# Patient Record
Sex: Male | Born: 1965 | Race: White | Hispanic: No | Marital: Married | Smoking: Former smoker
Health system: Southern US, Community
[De-identification: ages and names within clinical notes are randomized; demographics above are authoritative.]

## PROBLEM LIST (undated history)

## (undated) DIAGNOSIS — I509 Heart failure, unspecified: Secondary | ICD-10-CM

## (undated) DIAGNOSIS — I2510 Atherosclerotic heart disease of native coronary artery without angina pectoris: Secondary | ICD-10-CM

## (undated) DIAGNOSIS — I4891 Unspecified atrial fibrillation: Secondary | ICD-10-CM

## (undated) DIAGNOSIS — Z87442 Personal history of urinary calculi: Secondary | ICD-10-CM

---

## 2006-07-01 ENCOUNTER — Ambulatory Visit: Payer: Self-pay | Admitting: Urology

## 2006-09-21 ENCOUNTER — Ambulatory Visit: Payer: Self-pay | Admitting: Urology

## 2011-06-15 ENCOUNTER — Observation Stay: Payer: Self-pay | Admitting: Internal Medicine

## 2011-06-15 LAB — MAGNESIUM: Magnesium: 1.7 mg/dL — ABNORMAL LOW

## 2011-06-15 LAB — COMPREHENSIVE METABOLIC PANEL
Albumin: 3.9 g/dL (ref 3.4–5.0)
Anion Gap: 9 (ref 7–16)
BUN: 11 mg/dL (ref 7–18)
Bilirubin,Total: 0.4 mg/dL (ref 0.2–1.0)
Calcium, Total: 8.7 mg/dL (ref 8.5–10.1)
Chloride: 106 mmol/L (ref 98–107)
EGFR (African American): >60
Glucose: 103 mg/dL — ABNORMAL HIGH (ref 65–99)
Potassium: 3.7 mmol/L (ref 3.5–5.1)
Sodium: 138 mmol/L (ref 136–145)
Total Protein: 7.3 g/dL (ref 6.4–8.2)

## 2011-06-15 LAB — CK TOTAL AND CKMB (NOT AT ARMC)
CK, Total: 232 U/L
CK-MB: 1.9 ng/mL

## 2011-06-15 LAB — CBC
HGB: 16.3 g/dL (ref 13.0–18.0)
MCH: 31.4 pg (ref 26.0–34.0)
MCHC: 34.6 g/dL (ref 32.0–36.0)
MCV: 91 fL (ref 80–100)
RDW: 13 % (ref 11.5–14.5)

## 2011-06-15 LAB — TROPONIN I: Troponin-I: 0.09 ng/mL — ABNORMAL HIGH

## 2011-06-15 LAB — APTT: Activated PTT: 32.3 s

## 2011-06-15 LAB — PROTIME-INR
INR: 1
Prothrombin Time: 13.6 s

## 2011-06-15 LAB — CK-MB: CK-MB: 2.5 ng/mL (ref 0.5–3.6)

## 2011-06-16 LAB — COMPREHENSIVE METABOLIC PANEL
Albumin: 3.5 g/dL (ref 3.4–5.0)
Anion Gap: 8 (ref 7–16)
BUN: 12 mg/dL (ref 7–18)
Bilirubin,Total: 0.5 mg/dL (ref 0.2–1.0)
Creatinine: 0.92 mg/dL (ref 0.60–1.30)
EGFR (African American): >60
Glucose: 104 mg/dL — ABNORMAL HIGH (ref 65–99)
Osmolality: 279 (ref 275–301)
Potassium: 3.9 mmol/L (ref 3.5–5.1)
SGOT(AST): 20 U/L (ref 15–37)
SGPT (ALT): 25 U/L
Total Protein: 6.5 g/dL (ref 6.4–8.2)

## 2011-06-16 LAB — CBC WITH DIFFERENTIAL/PLATELET
Basophil #: 0.1 10*3/uL (ref 0.0–0.1)
Basophil %: 0.5 %
Eosinophil #: 0.3 10*3/uL (ref 0.0–0.7)
HGB: 14.8 g/dL (ref 13.0–18.0)
Lymphocyte #: 2.4 10*3/uL (ref 1.0–3.6)
Lymphocyte %: 20.1 %
MCH: 31 pg (ref 26.0–34.0)
MCHC: 34.3 g/dL (ref 32.0–36.0)
Neutrophil #: 8.2 10*3/uL — ABNORMAL HIGH (ref 1.4–6.5)
Neutrophil %: 70 %
RBC: 4.77 10*6/uL (ref 4.40–5.90)
RDW: 12.9 % (ref 11.5–14.5)
WBC: 11.8 10*3/uL — ABNORMAL HIGH (ref 3.8–10.6)

## 2011-06-16 LAB — LIPID PANEL
Cholesterol: 192 mg/dL (ref 0–200)
Ldl Cholesterol, Calc: 136 mg/dL — ABNORMAL HIGH (ref 0–100)
Triglycerides: 163 mg/dL (ref 0–200)
VLDL Cholesterol, Calc: 33 mg/dL (ref 5–40)

## 2011-06-16 LAB — CK-MB: CK-MB: 1.9 ng/mL (ref 0.5–3.6)

## 2011-10-15 ENCOUNTER — Inpatient Hospital Stay: Payer: Self-pay | Admitting: Internal Medicine

## 2011-10-15 LAB — CBC
HGB: 14.9 g/dL (ref 13.0–18.0)
MCH: 30 pg (ref 26.0–34.0)
MCHC: 33.8 g/dL (ref 32.0–36.0)
MCV: 89 fL (ref 80–100)
Platelet: 212 10*3/uL (ref 150–440)
RDW: 13 % (ref 11.5–14.5)

## 2011-10-15 LAB — BASIC METABOLIC PANEL
Anion Gap: 8 (ref 7–16)
Creatinine: 0.8 mg/dL (ref 0.60–1.30)
EGFR (African American): >60
EGFR (Non-African Amer.): >60
Glucose: 119 mg/dL — ABNORMAL HIGH (ref 65–99)
Sodium: 137 mmol/L (ref 136–145)

## 2011-10-15 LAB — TROPONIN I: Troponin-I: 0.98 ng/mL — ABNORMAL HIGH

## 2011-10-15 LAB — CK TOTAL AND CKMB (NOT AT ARMC): CK-MB: 10.6 ng/mL — ABNORMAL HIGH (ref 0.5–3.6)

## 2011-10-16 DIAGNOSIS — R079 Chest pain, unspecified: Secondary | ICD-10-CM

## 2011-10-16 LAB — LIPID PANEL
Cholesterol: 208 mg/dL — ABNORMAL HIGH (ref 0–200)
HDL Cholesterol: 29 mg/dL — ABNORMAL LOW (ref 40–60)
Ldl Cholesterol, Calc: 159 mg/dL — ABNORMAL HIGH (ref 0–100)
Triglycerides: 100 mg/dL (ref 0–200)
VLDL Cholesterol, Calc: 20 mg/dL (ref 5–40)

## 2011-10-16 LAB — TROPONIN I
Troponin-I: 18 ng/mL — ABNORMAL HIGH
Troponin-I: 27.59 ng/mL — ABNORMAL HIGH

## 2011-10-16 LAB — BASIC METABOLIC PANEL
Anion Gap: 9 (ref 7–16)
BUN: 14 mg/dL (ref 7–18)
Calcium, Total: 8.5 mg/dL (ref 8.5–10.1)
Chloride: 106 mmol/L (ref 98–107)
Co2: 24 mmol/L (ref 21–32)
Osmolality: 280 (ref 275–301)
Potassium: 4.1 mmol/L (ref 3.5–5.1)
Sodium: 139 mmol/L (ref 136–145)

## 2011-10-16 LAB — CBC WITH DIFFERENTIAL/PLATELET
Basophil %: 0.5 %
Eosinophil #: 0.1 10*3/uL (ref 0.0–0.7)
Eosinophil %: 1 %
HCT: 42.1 % (ref 40.0–52.0)
HGB: 13.7 g/dL (ref 13.0–18.0)
Lymphocyte #: 1.8 10*3/uL (ref 1.0–3.6)
Lymphocyte %: 15.2 %
MCHC: 32.4 g/dL (ref 32.0–36.0)
Neutrophil #: 8.9 10*3/uL — ABNORMAL HIGH (ref 1.4–6.5)
Neutrophil %: 76.5 %
Platelet: 200 10*3/uL (ref 150–440)
RBC: 4.78 10*6/uL (ref 4.40–5.90)

## 2011-10-16 LAB — CK TOTAL AND CKMB (NOT AT ARMC)
CK, Total: 1443 U/L — ABNORMAL HIGH (ref 35–232)
CK, Total: 1589 U/L — ABNORMAL HIGH (ref 35–232)
CK, Total: 1148 U/L — ABNORMAL HIGH (ref 35–232)
CK-MB: 90.8 ng/mL — ABNORMAL HIGH (ref 0.5–3.6)
CK-MB: 37 ng/mL — ABNORMAL HIGH (ref 0.5–3.6)

## 2011-10-16 LAB — MAGNESIUM: Magnesium: 2 mg/dL

## 2011-10-16 LAB — APTT: Activated PTT: 80.2 secs — ABNORMAL HIGH (ref 23.6–35.9)

## 2011-10-16 LAB — HEMOGLOBIN A1C: Hemoglobin A1C: 5.4 % (ref 4.2–6.3)

## 2011-10-17 LAB — BASIC METABOLIC PANEL
Anion Gap: 10 (ref 7–16)
Calcium, Total: 8.1 mg/dL — ABNORMAL LOW (ref 8.5–10.1)
Co2: 21 mmol/L (ref 21–32)
EGFR (African American): >60
Glucose: 85 mg/dL (ref 65–99)
Osmolality: 274 (ref 275–301)
Sodium: 138 mmol/L (ref 136–145)

## 2011-10-18 LAB — MAGNESIUM: Magnesium: 1.8 mg/dL

## 2011-10-21 DIAGNOSIS — I255 Ischemic cardiomyopathy: Secondary | ICD-10-CM | POA: Diagnosis present

## 2012-08-13 DIAGNOSIS — I252 Old myocardial infarction: Secondary | ICD-10-CM | POA: Insufficient documentation

## 2013-07-15 ENCOUNTER — Inpatient Hospital Stay: Payer: Self-pay | Admitting: Cardiology

## 2013-07-15 LAB — CBC
HCT: 46.6 % (ref 40.0–52.0)
HGB: 16.4 g/dL (ref 13.0–18.0)
MCH: 30.8 pg (ref 26.0–34.0)
MCHC: 35.1 g/dL (ref 32.0–36.0)
MCV: 88 fL (ref 80–100)
Platelet: 256 10*3/uL (ref 150–440)
RBC: 5.3 10*6/uL (ref 4.40–5.90)
RDW: 13 % (ref 11.5–14.5)
WBC: 12.9 10*3/uL — ABNORMAL HIGH (ref 3.8–10.6)

## 2013-07-15 LAB — BASIC METABOLIC PANEL
Anion Gap: 10 (ref 7–16)
BUN: 20 mg/dL — AB (ref 7–18)
CO2: 22 mmol/L (ref 21–32)
CREATININE: 0.77 mg/dL (ref 0.60–1.30)
Calcium, Total: 8.4 mg/dL — ABNORMAL LOW (ref 8.5–10.1)
Chloride: 105 mmol/L (ref 98–107)
Glucose: 122 mg/dL — ABNORMAL HIGH (ref 65–99)
Osmolality: 278 (ref 275–301)
Potassium: 4.3 mmol/L (ref 3.5–5.1)
Sodium: 137 mmol/L (ref 136–145)

## 2013-07-15 LAB — PROTIME-INR
INR: 1.2
Prothrombin Time: 15.1 secs — ABNORMAL HIGH (ref 11.5–14.7)

## 2013-07-15 LAB — TROPONIN I: Troponin-I: <0.02 ng/mL

## 2013-07-15 LAB — URINALYSIS, COMPLETE
BACTERIA: NONE SEEN
Bilirubin,UR: NEGATIVE
Glucose,UR: NEGATIVE mg/dL (ref 0–75)
Ketone: NEGATIVE
Leukocyte Esterase: NEGATIVE
NITRITE: NEGATIVE
Ph: 5 (ref 4.5–8.0)
Protein: NEGATIVE
RBC,UR: <1 /HPF (ref 0–5)
SPECIFIC GRAVITY: 1.009 (ref 1.003–1.030)
Squamous Epithelial: NONE SEEN
WBC UR: <1 /HPF (ref 0–5)

## 2013-07-15 LAB — HEPATIC FUNCTION PANEL A (ARMC)
ALT: 26 U/L (ref 12–78)
Albumin: 2.6 g/dL — ABNORMAL LOW (ref 3.4–5.0)
Alkaline Phosphatase: 60 U/L
Bilirubin, Direct: <0.1 mg/dL (ref 0.00–0.20)
Bilirubin,Total: 0.2 mg/dL (ref 0.2–1.0)
SGOT(AST): 19 U/L (ref 15–37)
Total Protein: 5 g/dL — ABNORMAL LOW (ref 6.4–8.2)

## 2013-07-15 LAB — LIPASE, BLOOD: Lipase: 236 U/L (ref 73–393)

## 2013-07-15 LAB — APTT: ACTIVATED PTT: 36.8 s — AB (ref 23.6–35.9)

## 2013-07-15 LAB — TSH: Thyroid Stimulating Horm: 10.1 u[IU]/mL — ABNORMAL HIGH

## 2013-07-15 LAB — MAGNESIUM: Magnesium: 1.5 mg/dL — ABNORMAL LOW

## 2014-07-11 NOTE — H&P (Signed)
PATIENT NAME:  Andres Andres [name], Andres Andres [name] MR#:  [id number] DATE OF BIRTH:  Oct 19, 1965  DATE OF ADMISSION:  10/15/2011  REFERRING PHYSICIAN: Dr. Cyril Andres [name] PRIMARY CARE PHYSICIAN: Dr. Dayna Andres [name]    CHIEF COMPLAINT: Chest pain.   HISTORY OF PRESENT ILLNESS: Patient is Andres [name] 49 year old Caucasian male with history of hyperlipidemia, psoriasis and gastroesophageal reflux disease who was here for chest pain. Patient was in his usual state of self until about 1:30 p.m. He was doing work around the house. He was on Andres [name] ladder and started to have substernal chest pain, severe, constant, crushing. There was some radiation to the left arm. Patient initially thought this was more of indigestion. Pain was off and on, however, currently it is constant. Patient presented here and was noted to have elevated troponin of 0.98 with elevated CK-MB. EKGs were obtained. Initial EKG showed possible ST elevation in V1 and some in V2 as well. There is no reciprocal changes per se. An EKG was repeated in about 30 minutes and there is significant improvement in the ST elevations in V1 and V2. As he is still having chest pain on top of morphine and sublingual nitroglycerin hospitalist service was contacted for further evaluation and admission. Heparin drip has been ordered by ER physician which still has not been started.   PAST MEDICAL HISTORY:  1. History of psoriasis, no longer on therapy. 2. History of anxiety and depression.  3. History of septoplasty 2002. 4. Gastroesophageal reflux disease. 5. Hyperlipidemia.   CURRENT MEDICATIONS:  1. Aspirin 81 mg daily. 2. Dexilant 60 mg one cap daily.  3. Lipitor 20 mg daily at bedtime.   ALLERGIES: No known drug allergies.   SOCIAL HISTORY: He smokes Andres [name] pack Andres [name] day. No alcohol or drug use.   FAMILY HISTORY: Mom with hypertension and diabetes as well as MI in her 3950s. She had bypass x4. Father with cerebrovascular accident. Paternal grandfather had MI as well. Family history of cancer.   REVIEW  OF SYSTEMS: CONSTITUTIONAL: No fever, fatigue or weakness. No weight changes. EYES: No blurry vision or double vision. ENT: No tinnitus or hearing loss. RESPIRATORY: No cough, wheezing, hemoptysis, or chronic obstructive pulmonary disease. CARDIOVASCULAR: Chest pain as above. No orthopnea or edema. No dyspnea on exertion. No history of high blood pressure. GASTROINTESTINAL: No nausea, vomiting, abdominal pain, or rectal bleeding. GENITOURINARY: Denies dysuria, frequency or incontinence. ENDOCRINE: Denies polyuria or nocturia. HEME/LYMPH: Denies easy bruising. SKIN: Denies any new rashes. His psoriasis is stable. MUSCULOSKELETAL: No arthritis. NEUROLOGIC: No numbness or weakness or CVA or transient ischemic attack. PSYCHIATRIC: Has history of anxiety and depression per chart which he denies.   PHYSICAL EXAMINATION:  VITAL SIGNS: Temperature 98, pulse 68, respiratory rate 18, blood pressure 126/74, oxygen sat 99% on room air.   GENERAL: Patient is Andres [name] mildly obese Caucasian male lying in bed in mild distress.   HEENT: Normocephalic, atraumatic. Pupils are equal and reactive. Extraocular muscles intact. Anicteric sclerae. Moist mucous membranes.   NECK: Supple. No thyroid tenderness. No lymphadenopathy. No JVD.   CARDIOVASCULAR: S1, S2. No murmurs, rubs, or gallops.   LUNGS: Clear to auscultation without wheezing or rhonchi.   ABDOMEN: Soft, nontender, nondistended. No organomegaly appreciated.   EXTREMITIES: No significant lower extremity edema.   SKIN: Scattered multiple areas of plaques and patches consistent with psoriasis.   NEUROLOGICAL: Cranial nerves II through XII grossly intact. Strength is 5/5 all extremities.   PSYCH: Awake, alert, oriented x3. Pleasant, cooperative.   LABORATORY, DIAGNOSTIC, AND RADIOLOGICAL DATA: Chloride  108, potassium 3.7, sodium 137, glucose 119, troponin 0.98, CK-MB 10.6. CK total 838, WBC 11.2, hemoglobin 14.9, hematocrit 44.3, platelets 212. Initial EKG rate  70 sinus, otherwise as above. Second EKG again sinus, rate 68, possible left atrial enlargement just like the first, as dictated above otherwise.   ASSESSMENT AND PLAN: We have Andres [name] 49 year old male with history of hyperlipidemia, gastroesophageal reflux disease, anxiety, depression and psoriasis here with chest pain with very typical features including EKG changes which are dynamic and positive troponins consistent with NSTEMI. The case was then discussed with the ER physician as well as Andres Andres [name]. As he has ongoing chest pains with possibility of progression we would admit the patient to Critical Care Unit. Heparin drip has been ordered. We would also start nitroglycerin drip and load the patient with Plavix 300 mg. Aspirin has already been given to the patient. We would start the patient on statin and Andres [name] beta blocker with morphine p.r.n. as well as oxygen. We would follow back and chest pains. Per Andres Andres [name] we would consider Integrilin. Would check Andres [name] statin, cycle the troponins and get an echocardiogram as well. Patient would likely need Andres [name] catheterization and we would make the patient n.p.o. past midnight. The case was discussed with Andres Andres [name] whether he would benefit from transfer to [facility] and catheterization for tonight, which he deems is not necessary given improvement in the EKG. He does smoke tobacco and he was counseled for three minutes. He does want Andres [name] patch which we would order. For psoriasis he needs outpatient follow up. Patient is at Andres [name] high risk for further progression of his symptoms and cardiac complications.   TOTAL CRITICAL TIME SPENT: 55 minutes.   CODE STATUS: FULL CODE.   ____________________________ Andres Eaton, MD sa:cms D: 10/15/2011 19:52:33 ET T: 10/16/2011 06:49:24 ET JOB#: [id number]  cc: Andres Eaton, MD, <Dictator> Andres Andres [name]. Andres Barker, MD Andres Blinks, MD Andres Eaton MD ELECTRONICALLY SIGNED 10/17/2011 19:13

## 2014-07-11 NOTE — Consult Note (Signed)
PATIENT NAME:  Andres [name], Andres [name] MR#:  [id number] DATE OF BIRTH:  [birth year]  DATE OF CONSULTATION:  10/16/2011  REFERRING PHYSICIAN:  Krystal Eaton, MD CONSULTING PHYSICIAN:  Lamar Blinks, MD  PRIMARY CARE PHYSICIAN: None known.  REASON FOR CONSULTATION: Acute myocardial infarction.   CHIEF COMPLAINT: "I have chest pain."   HISTORY OF PRESENT ILLNESS: This is a 49 year old male with known hypertension and hyperlipidemia who has had recent admission to the hospital earlier this year with chest discomfort and minimal elevation of troponin of 0.9. At that time, he had significant improvement of symptoms and no evidence of EKG changes. He underwent a stress test showing excellent exercise tolerance and no evidence of myocardial ischemia. Since then he has done fairly well until sudden onset of substernal chest pain radiating into the arm and back lasting approximately one hour with EKG changes with normal sinus rhythm with nonspecific ST elevation in the precordial leads. The patient had nitroglycerin with improvements of these minimal changes and now has had continued chest discomfort, but improved with nitroglycerin, oxygen, morphine, and beta blocker. The patient now has some soreness in his chest with an elevated troponin below 1.2.   REVIEW OF SYSTEMS: The remainder of his review of systems is negative for vision change, ringing in the ears, hearing loss, cough, congestion, heartburn, nausea, vomiting, diarrhea, bloody stools, stomach pain, extremity pain, leg weakness, cramping of the buttocks, known blood clots, headaches, blackouts, dizzy spells, nosebleeds, congestion, trouble swallowing, frequent urination, urination at night, muscle weakness, numbness, anxiety, depression, skin lesions, or skin rashes.   PAST MEDICAL HISTORY:  1. Hypertension.  2. Hyperlipidemia.   FAMILY HISTORY: No family members with early onset of cardiovascular disease.   SOCIAL HISTORY: He does smoke and  occasionally drinks alcohol.   ALLERGIES: No known drug allergies.   CURRENT MEDICATIONS: As listed.  PHYSICAL EXAMINATION:   VITAL SIGNS: Blood pressure is 100/60 bilaterally and heart rate 72 upright and reclining and regular.   GENERAL: He is a well appearing male in no acute distress.   HEENT: No icterus, thyromegaly, ulcers, hemorrhage, or xanthelasma.   HEART: Regular rate and rhythm with normal S1 and S2 without murmur, gallop, or rub. Point of maximal impulse is normal size and placement. Carotid upstroke is normal without bruit. Jugular venous pressure is normal.   LUNGS: Lungs are clear to auscultation with normal respirations.   ABDOMEN: Soft and nontender without hepatosplenomegaly or masses. Abdominal aorta is normal size without bruit.   EXTREMITIES: 2+ bilateral pulses in dorsal, pedal, radial, and femoral arteries without lower extremity edema, cyanosis, clubbing, or ulcers.   NEUROLOGIC: He is oriented to time, place, and person with normal mood and affect.   ASSESSMENT: This is a 49 year old male with hypertension and hyperlipidemia with acute chest pain and abnormal EKG with elevated troponin consistent with subendocardial myocardial infarction needing further treatment options.   RECOMMENDATIONS:  1. Continue heparin, nitroglycerin, oxygen, and beta blocker for further risk reduction in current chest discomfort and myocardial infarction.  2. Serial ECG and enzymes to assess extent of myocardial infarction.  3. Proceed to cardiac catheterization to assess coronary anatomy and further treatment thereof as necessary. The patient understands the risks and benefits of cardiac catheterization. These include the possibility of death, stroke, heart attack, infection, bleeding or blood clot. He is at low risk for conscious sedation. ____________________________ Lamar Blinks, MD bjk:slb D: 10/16/2011 08:37:50 ET T: 10/16/2011 10:07:55 ET JOB#: [id number]  cc: Lamar Blinks,  MD, <Dictator> Lamar BlinksBRUCE J Umeka Wrench MD ELECTRONICALLY SIGNED 10/25/2011 10:10

## 2014-07-11 NOTE — Discharge Summary (Signed)
PATIENT NAME:  Andres [name], Andres [name] MR#:  [id number] DATE OF BIRTH:  April 15, 1965  DATE OF ADMISSION:  10/15/2011 DATE OF DISCHARGE:  10/18/2011  ADMITTING DIAGNOSIS: Non-STEMI.  DISCHARGE DIAGNOSES: 1. Non-STEMI status post cardiac catheterization on 10/16/2011 and drug-eluting stent placement in proximal LAD according to Dr. Gwen PoundsKowalski. Drug-eluting stent was performed on 99% lesion of proximal LAD. Following intervention there was 0% residual stenosis. 2. Gastroesophageal reflux disease.  3. Hyperlipidemia.  4. Tobacco abuse.  5. Mild ischemic cardiomyopathy according to echocardiogram results with ejection fraction of 45 to 50%, possibly acute MI related.   DISCHARGE CONDITION: Stable.   DISCHARGE MEDICATIONS: The patient is to resume his outpatient medications which are Dexilant 60 mg p.o. daily.   ADDITIONAL MEDICATIONS:  1. Aspirin 325 mg p.o. daily.  2. Prasugrel 10 mg daily. 3. Nitrostat 0.4 mg sublingually every five minutes as needed.  4. Metoprolol 12.5 mg p.o. twice daily.  5. Nicotine patch 21 mg topically daily.  6. Zestril 2.5 mg p.o. at bedtime.  7. Lipitor 40 mg p.o. at bedtime. This is new dose for Lipitor.   HOME OXYGEN: None.   DIET: 2 gram salt, low fat, low cholesterol.   PHYSICAL ACTIVITY LIMITATIONS: As tolerated.   FOLLOW-UP:  1. Follow-up appointment with Dr. Dayna BarkerAldridge in two days after discharge. 2. Follow-up with Dr. Gwen PoundsKowalski in two days after discharge.   CONSULTANT: Dr. Gwen PoundsKowalski.   RADIOLOGICAL STUDIES: Chest x-ray, portable, single view, 10/15/2011, revealed no evidence of CHF or pneumonia. Cannot exclude minimal subsegmental atelectasis at lung bases. Follow-up PA and lateral chest x-ray would be of value according to the radiologist.  REASON FOR ADMISSION: The patient is [name] 49 year old Caucasian male with past medical history significant for history of ongoing tobacco abuse who presented to the hospital with complaints of chest pain. Please refer to Dr.  Lupe CarneyAhmadzia's admission note on 10/15/2011.    PHYSICAL EXAMINATION: On arrival to the hospital, the patient's temperature was 98, pulse 68, respiratory rate 18, blood pressure 126/74, saturation was 99% on room air. Physical exam was unremarkable.   The patient's EKG showed normal sinus rhythm at 70. No acute ST-T changes were noted. Second EKG revealed left atrial enlargement just like the first one. However, initial EKG revealed also ST elevation in V1 as well as V2 as well. Third EKG done 30 minutes later revealed ST elevations in V1, V2 and significant improvement in ST elevations of V1, V2.   LABORATORY DATA: Admission labs in the Emergency Room revealed mild elevation of glucose to 119, otherwise BMP was unremarkable. The patient's cardiac enzymes, CK total, was elevated at 838. MB fraction was 10.6 and troponin was 0.98. However, on the second set the patient's troponin was elevated to 18.0 CK-MB fraction was 80.8. CK total was 1443. Third set revealed troponin elevation to 27.59. MB fraction was 90.8. CK total was 1589. The patient's initial CBC showed white blood cell count elevation to 11.2, otherwise CBC was unremarkable.   HOSPITAL COURSE:  1. The patient was admitted to the hospital for further evaluation. He was started on heparin IV and consultation with cardiologist, Dr. Gwen PoundsKowalski, was obtained. Dr. Gwen PoundsKowalski felt the patient warranted further investigation. He saw him in consultation on 10/16/2011. He recommended to continue heparin, nitroglycerin, oxygen, as well as beta-blocker to decrease further risks of recurrent chest discomfort and myocardial infarction. He recommended to proceed to cardiac catheterization to assess coronary anatomy and further treatment thereof as necessary. The patient was taken to the Cardiac Lab  and he underwent cardiac catheterization the same day, 10/16/2011. During cardiac catheterization he was noted to have [name] 99% lesion in the proximal LAD and [name] drug-eluting  stent was performed on this lesion. Following intervention there was 0% residual stenosis. Coronary circulation revealed proximal LAD 99% stenosis. There was TIMI grade III flow through the vessel, brisk flow. First diagonal was 60% stenosis. Mid circumflex there was 30% stenosis. Right posterolateral segment was 70% stenosis. Postprocedure the patient did well. He didn't complain of any pains. His right groin catheterization site was also intact with no swelling, pain, or bleeding. He was able to ambulate with no significant problems and he is being discharged. On the discharge the patient's vital signs showed temperature 98.3, pulse 60, respiration rate 16 to 18, blood pressure ranging from 94 to 120 systolic and 60's to 70's diastolic, and oxygen saturation was 94 to 95% on room air at rest. The patient was advised to continue his medications including aspirin, Prasugrel, nitroglycerin as needed, metoprolol, as well as Lipitor. The patient's lipid panel was checked while he was in the hospital and the patient's LDL was found to be 159. The patient's cholesterol level total was 208, triglycerides were 100, HDL was low at 29. Because of hyperglycemia, he also had [name] hemoglobin A1c checked which was normal at 5.4. It was felt that the patient did have significant hyperlipidemia which required intervention with medications. He is to continue Lipitor and have his lipid panel as well as LFTs checked in the future. 2. For gastroesophageal reflux disease, the patient is to continue Dexilant.  3. For tobacco abuse, the patient was counseled extensively to stop smoking and nicotine patch was initiated.  4. The patient had echocardiogram after procedure on 10/16/2011 which showed moderate to severe anterior wall hypokinesis. Mitral valve was noted to be grossly normal. Mild to moderate mitral regurgitation was noted and mild tricuspid regurgitation. Aortic valve was not well visualized. Left ventricular systolic function  was found to be mildly reduced. Ejection fraction was found to be 45 to 50%. Aortic valve opened well. No pericardial effusion noted. Because of his mild cardiomyopathy which was felt to be likely acute MI related, the patient was started on Zestril. As the patient's blood pressure was marginal, it was decided that patient should take Zestril at bedtime. It is recommended to follow the patient's blood pressure readings and advance the patient's Zestril dose depending on his needs.   TIME SPENT: 40 minutes.   ____________________________ Katharina Caper, MD rv:drc D: 10/18/2011 18:45:42 ET T: 10/19/2011 12:21:19 ET JOB#: [id number]  cc: Katharina Caper, MD, <Dictator>, Katina Dung. Dayna Barker, MD, Lamar Blinks, MD Humaira Sculley Winona Legato MD ELECTRONICALLY SIGNED 10/27/2011 1:01

## 2014-07-15 NOTE — Discharge Summary (Signed)
Dates of Admission and Diagnosis:  Date of Admission 15-Jul-2013   Date of Discharge 15-Jul-2013   Admitting Diagnosis atrial fibrillation   Final Diagnosis atrial fibrillation   Discharge Diagnosis 1 afib   2 cad    Chief Complaint/History of Present Illness afib with rvr. chest pain due to demand ischemia   Allergies:  No Known Allergies:   Pertinent Past History:  Pertinent [facility]   Hospital Course:  Hospital Course Presented with chest pain. noted to be in afib with rvr. Mild troponin elevation felt to be due to demand ischemia. rate controlled with cardizem. No further chest pain ar rvr.   Condition on Discharge Good   Code Status:  Code Status Full Code   DISCHARGE INSTRUCTIONS HOME MEDS:  Medication Reconciliation: Patient's Home Medications at Discharge:     Medication Instructions  dexilant 60 mg oral delayed release capsule  1 cap(s) orally once a day   nitrostat 0.4 mg sublingual tablet  1 tab(s) sublingual every 5 minutes   aspirin 81 mg oral delayed release tablet  1 tab(s) orally once a day   rivaroxaban 20 mg oral tablet  1 tab(s) orally once a day   diltiazem 180 mg/24 hours oral capsule, extended release  1 cap(s) orally once a day    PRESCRIPTIONS: PRINTED AND PLACED ON CHART   Physician's Instructions:  Home Health? No   Treatments None   Home Oxygen? No   Diet Low Sodium   Diet Consistency Regular Consistency   Activity Limitations None   Return to Work after follow up visit with MD   Time frame for Follow Up Appointment 1-2 weeks   Electronic Signatures: Dalia HeadingFath, Tomeika Weinmann A (MD)  (Signed 25-Apr-15 08:28)  Authored: ADMISSION DATE AND DIAGNOSIS, CHIEF COMPLAINT/HPI, Allergies, PERTINENT PAST HISTORY, HOSPITAL COURSE, DISCHARGE INSTRUCTIONS HOME MEDS, PATIENT INSTRUCTIONS   Last Updated: 25-Apr-15 08:28 by Dalia HeadingFath, Eddi Hymes A (MD)

## 2014-07-15 NOTE — H&P (Signed)
PATIENT NAME:  Andres Andres [name], Andres Andres [name] MR#:  [id number] DATE OF BIRTH:  Sep 15, 1965  DATE OF ADMISSION:  07/15/2013  PRIMARY CARE PHYSICIAN: Leim FabryBarbara Aldridge, MD  REFERRING PHYSICIAN: Dr. York CeriseForbach  CHIEF COMPLAINT: Chest pain and palpitations.   HISTORY OF PRESENT ILLNESS: The patient is Andres [name] 49 year old Caucasian male with past medical history of psoriasis, anxiety, GERD, hyperlipidemia, and coronary artery disease with three-vessel blockage status post 1 cardiac stent in the past who is presenting to the ER with the chief complaint of chest pain and palpitations. The patient is reporting that he was in his usual state of health until last night. He suddenly woke up from sleep at around 4:00 Andres [name].m. with chest discomfort and palpitations. As the palpitations and chest discomfort were not going away, his wife drove him to the ER. The patient was in atrial fibrillation with RVR with heart rate at Andres [name] rate of 160 initially. The patient was given diltiazem 50 mg IV followed by 120 mg p.o. Subsequently, his heart rate was controlled and dropped down to 90s to 100s. The patient was started on heparin bolus as this is new onset atrial fibrillation and was given aspirin 325 mg. Hospitalist team is called to admit the patient. The patient's chest discomfort and palpitations are significantly improved after heart rate went down to 100s. The patient is resting comfortably during my examination and wife is at bedside. The patient sees Dr. Gwen PoundsKowalski as an outpatient. Denies any dizziness or loss of consciousness. He admitted that he drinks 3 to 4 cups of coffee on Andres [name] daily basis. Denies any intake of soft drinks on Andres [name] regular basis.   PAST MEDICAL HISTORY: Anxiety and depression, GERD, hyperlipidemia, hypertension, coronary artery disease status post stent placement.  PAST SURGICAL HISTORY: Septoplasty, coronary stent placement.   ALLERGIES: No known drug allergies.   PSYCHOSOCIAL HISTORY: Lives at home with wife. Smokes 1 pack Andres [name]  day and wants to quit smoking. Denies any alcohol or illicit drug usage.   FAMILY HISTORY: Mom with hypertension, diabetes mellitus and MI in her 2950s. She had 4 vessel bypass. Father with CVA. Family history of cancer.   HOME MEDICATIONS: Nitrostat 4.4 mg 1 tablet sublingually every 5 minutes, Dexilant 60 mg p.o. once Andres [name] day, aspirin 325 mg once daily.  REVIEW OF SYSTEMS: CONSTITUTIONAL: Denies any fever or fatigue. Denies any weakness, pain.  EYES: Denies blurry vision, double vision. Denies any glaucoma or cataracts.  EARS, NOSE, THROAT: Denies epistaxis, discharge, snoring, postnasal drip.  RESPIRATORY: Denies cough, COPD. Denies any hemoptysis or dyspnea. CARDIOVASCULAR: Chest pain with palpitations, resolved after giving Cardizem. Denies any syncope or dyspnea on exertion. GASTROINTESTINAL: Denies nausea, vomiting, diarrhea, abdominal pain, hematemesis.  GENITOURINARY: No dysuria, hematuria, renal calculi. ENDOCRINE: Denies polyuria, nocturia, thyroid problems.  HEMATOLOGY AND LYMPHATIC: No anemia, easy bruising, bleeding.  INTEGUMENTARY: No acne, rash, lesions.  MUSCULOSKELETAL: No joint pain in the neck and back. Denies any gout. Has history of psoriasis. Denies any arthritis.  NEUROLOGIC: Denies vertigo, ataxia, dementia, headache. PSYCHIATRIC: No ADD, OCD. Has Andres [name] history of anxiety and depression.   PHYSICAL EXAMINATION: VITAL SIGNS: Temperature 97.6, pulse 155 initially. Subsequently, after giving Cardizem, it went down to 90s to 100s, respirations 20, blood pressure 95/71, pulse ox is 94%.  GENERAL APPEARANCE: Not in acute distress. Moderately built and nourished.  HEENT: Normocephalic, atraumatic. Pupils are equally reacting to light and accommodation. No scleral icterus. No conjunctival injection. No sinus tenderness. No postnasal drip. Moist mucous membranes.  NECK: Supple. No JVD.  No thyromegaly. Range of motion is intact.  LUNGS: Clear to auscultation bilaterally. No accessory  muscle usage. No anterior chest wall tenderness on palpation.  CARDIOVASCULAR: Irregularly irregular. No murmurs.  GASTROINTESTINAL: Soft. Bowel sounds are positive in all 4 quadrants. Nontender, nondistended. No hepatosplenomegaly. No masses felt.  NEUROLOGIC: Awake, alert, and oriented x3. Motor and sensory are grossly intact. Reflexes are 2+. Cranial nerves II through XII are grossly intact.  EXTREMITIES: No edema. No cyanosis. No clubbing.  SKIN: Warm to touch. Normal turgor. No rashes. No lesions.  MUSCULOSKELETAL: No joint effusion, tenderness, erythema.  PSYCHIATRIC: Normal mood and affect.  LABORATORIES AND IMAGING STUDIES: Andres [name] 12-lead EKG: Atrial fibrillation with RVR at 165 beats per minute, normal QRS duration, corrected QT 483. No acute ST elevations.  Troponin less than 0.02. CBC: WBC 12.9. The rest of the CBC is normal. PT 10.1, INR 1.2, PTT 36.8. LFTs: Total protein is at low at 5. Serum albumin low at 2.6. The rest of the LFTs are normal. Chem-8: Glucose 122, BUN 20, creatinine 0.77. Sodium, potassium, and chloride are normal. Anion gap is normal. Serum osmolality is normal. Calcium 8.4. Magnesium 1.5. Lipase 236.   Chest x-ray, portable: Cardiac enlargement. No evidence of active cardiopulmonary disease.   ASSESSMENT AND PLAN: Andres [name] 49 year old Caucasian male who woke up from sleep with chest pain and palpitations, will be admitted with the following assessment and plan:  1.  Chest pain with palpitations, new onset atrial fibrillation with rapid ventricular response. We will admit him to telemetry. Heart rate is currently controlled. Will continue p.o. Cardizem. Continue heparin drip. Cardiology consult is placed to Dr. Gwen Pounds. We will check TSH and 2-D echocardiogram is ordered. The patient was advised not to drink coffee.  2.  History of coronary artery disease status post stent. Chest pain is probably from atrial fibrillation with rapid ventricular response. Will cycle cardiac  biomarkers. The patient will be on aspirin. Continue heparin drip. We will continue statin.  3.  History of gastroesophageal reflux disease. Continue ranitidine. 4.  History of hyperlipidemia. We will check Andres [name] fasting lipid panel and continue statin.  5.  Hypomagnesemia. We will replace magnesium and repeat magnesium levels.  6.  History of anxiety and depression.  7.  We will provide him gastrointestinal prophylaxis. 8.  Deep venous thrombosis prophylaxis. The patient is on heparin drip currently.  The diagnosis and plan of care was discussed at bedside and the patient and wife both verbalized understanding of the plan.   TOTAL TIME SPENT ON ADMISSION: 50 minutes.  ____________________________ Ramonita Lab, MD ag:sb D: 07/15/2013 07:23:00 ET T: 07/15/2013 07:49:15 ET JOB#: [id number]  cc: Ramonita Lab, MD, <Dictator> Katina Dung. Dayna Barker, MD Ramonita Lab MD ELECTRONICALLY SIGNED 07/27/2013 4:14

## 2014-07-15 NOTE — Consult Note (Signed)
   Present Illness Pt is a 49 yo male with histoyr of cad s/p pci who presented to the er with complaints of rapid heart rate and chest pain. He was noted to be in afib with rvr. He had a mild troponin elevation to 0.95 EKG revealed afib with rvr without acute injury current. He was given cardizem with improvement in his heart rate. He remains in afib withcontrolled vr. No further chest pain. CHADSS score is 1. NO bleeding history.   Physical Exam:  GEN well developed, well nourished, no acute distress   HEENT PERRL, hearing intact to voice   NECK supple   RESP clear BS  no use of accessory muscles   CARD Irregular rate and rhythm   ABD denies tenderness  no liver/spleen enlargement  no Adominal Mass   LYMPH negative neck, negative axillae   EXTR negative cyanosis/clubbing, negative edema   SKIN normal to palpation   NEURO cranial nerves intact, motor/sensory function intact   PSYCH A+O to time, place, person   Review of Systems:  Subjective/Chief Complaint chest pain   General: No Complaints   Skin: No Complaints   ENT: No Complaints   Eyes: No Complaints   Neck: No Complaints   Respiratory: No Complaints   Cardiovascular: Chest pain or discomfort  Palpitations   Gastrointestinal: No Complaints   Genitourinary: No Complaints   Vascular: No Complaints   Musculoskeletal: No Complaints   Neurologic: No Complaints   Hematologic: No Complaints   Endocrine: No Complaints   Psychiatric: No Complaints   Review of Systems: All other systems were reviewed and found to be negative   Medications/Allergies Reviewed Medications/Allergies reviewed   Family & Social History:  Family and Social History:  Family History Non-Contributory   Social History negative tobacco, negative Illicit drugs   Place of Living Home        Admit Diagnosis:   NEW AFIB WITH RVR CORONARY ARTERY DISEAS: Onset Date: 15-Jul-2013, Status: Active, Description: NEW AFIB WITH RVR  CORONARY ARTERY DISEAS  EKG:  Interpretation afib with variable ventricular response    No Known Allergies:    Impression 49 yo male with history of cad s/p pci now preesnting with cp and noted to be in afib with rvr. Mild troponin elevation which does not appear to be due to an acute coronary event. Likely secondary to demand ischemia due to the rvr. Rate improved at present but still in afib. No contraindication for anticoagulation. CHADSS score is 1. WIll ann xarelto 20 mg daily and continue cardizem at 180 mg daily for rate control. Will discharge to home with follow up next week with Dr. Waldron SessionKowaslki   Plan 1. D/C iv heparin 2. Start cardizem cd 180 mg dialy and xarelto 20 mg dialy 3. Conitnue low dose asa 4. Discharge to home 5. FOllow up with Dr. Gwen PoundsKowalski Monday am.   Electronic Signatures: Dalia HeadingFath, Reiss Mowrey A (MD)  (Signed 25-Apr-15 08:26)  Authored: General Aspect/Present Illness, History and Physical Exam, Review of System, Family & Social History, Health Issues, EKG , Allergies, Impression/Plan   Last Updated: 25-Apr-15 08:26 by Dalia HeadingFath, Permelia Bamba A (MD)

## 2014-07-16 NOTE — H&P (Signed)
PATIENT NAME:  Andres [name], Andres [name] MR#:  [id number] DATE OF BIRTH:  1966-02-26  DATE OF ADMISSION:  06/15/2011  PRIMARY CARE PHYSICIAN: Leim FabryBarbara Aldridge, MD   HISTORY OF PRESENT ILLNESS: The patient is [name] 49 year old Caucasian male with past medical history significant for history of gastroesophageal reflux disease and history of hyperlipidemia who presented to the hospital with complaints of chest pains. According to the patient, he has been having chest pain for [name] few days now, started Friday evening, started after the patient ate. He thought it was just heartburn. Pain was described as midsternal ache, 7 out of 10 by intensity, constant discomfort, lasted approximately 10 to 15 minutes and went away by itself. He did quite okay yesterday on Saturday, however, in the evening again this pain flared up at around 6 p.m., very similar kind of discomfort midsternal, 7 out of 10  by intensity, ache, constant, and lasted approximately the same time and also went away by itself. This morning, however, the patient was awakened at around 4:15 bursting into cold sweat and was complaining of chest pains. This time the patient's pain radiated to the left arm and accompanied by cold sweats as well as chills and episode of nausea and vomiting with no hematemesis. Pain was somewhat intermittent but coming back and forth. It would subside and was somewhat better after the patient vomited, however, it came back [name] few minutes later. It happened at least 3 or 4 times. He was brought to the Emergency Room. His troponin was noted to be slightly elevated and hospitalist services were contacted for admission. The patient denies any chest pains at this time.   PAST MEDICAL HISTORY:  1. History of hyperlipidemia, recently started on medication. 2. Gastroesophageal reflux disease, status post EGD which was normal in 2004. 3. History of septoplasty in 2002. 4. History of anxiety/depression.  5. Psoriasis.   MEDICATIONS:   1. Prilosec over-the-counter once daily. 2. Lipitor 10 mg p.o. at bedtime. 3. Citalopram 10 mg p.o. daily.  4. Methotrexate 15 mg p.o. on Saturdays. 5. Folic acid 1 mg p.o. daily.   PAST SURGICAL HISTORY: As above.  ALLERGIES: None.   FAMILY HISTORY: Hypertension as well as diabetes in the patient's mother who had heart attack in her 2250's. She also had coronary artery bypass graft x4. The patient's father had numerous CVAs and died at the age of 50. The patient's paternal grandfather had MI at the age of 50. The patient's few aunts had cancer, one colorectal cancer and the other one lung cancer. She was [name] smoker.   SOCIAL HISTORY: The patient is married. He has no children. He smokes approximately 1 pack per day for approximately six years now. He smoked for three years and then quit for three years and now is back over the past three years smoking. Denies any alcohol abuse. He is [name] heavy Arboriculturistequipment operator.   REVIEW OF SYSTEMS: Positive for feeling chills and sweats today early in the morning as well as episodes of nausea, vomiting, and chest pains. Denies any fever, however admits of chills. Denies any fatigue or weakness. No weight loss or gain. In regards to eyes, denies any blurry vision, double vision, glaucoma, or cataracts. ENT: Denies any tinnitus, allergies, epistaxis, sinus pain, dentures, difficulty swallowing. He snores but no daytime sleepiness. RESPIRATORY: Denies any cough, wheezes, asthma, or COPD. CARDIOVASCULAR: Denies orthopnea, edema, arrhythmias, palpitations, or syncope. GASTROINTESTINAL: Denies any diarrhea, abdominal pain, rectal bleeding, change in bowel habits. GENITOURINARY: Denies dysuria, hematuria, frequency,  incontinence. ENDOCRINOLOGY: Denies any polydipsia, nocturia, thyroid problems, heat or cold intolerance, or thirst. HEMATOLOGIC: Denies any anemia, easy bruising, bleeding, or swollen glands. SKIN: Denies any acne, rashes, lesions, or change in moles.  MUSCULOSKELETAL: Denies arthritis, cramps, swollen glands. NEUROLOGIC: No numbness, epilepsy, tremor. PSYCHIATRIC: Denies anxiety, insomnia, or depression.   PHYSICAL EXAMINATION:  VITAL SIGNS: Temperature 98, pulse 74, respiratory rate 20, blood pressure 135/82, saturation 100% on room air.   GENERAL: This is [name] well developed, well nourished Caucasian male in no significant distress laying on the stretcher.   HEENT: Pupils are equal and reactive to light. Extraocular movements intact. No icterus or conjunctivitis. Has normal hearing. No pharyngeal erythema. Mucosa is moist.  NECK: Neck did not reveal any masses, supple, nontender. Thyroid not enlarged. No adenopathy. No JVD or carotid bruits bilaterally. Full range of motion.   LUNGS: Clear to auscultation in all fields. No rales, rhonchi, diminished breath sounds, or wheezing. No labored inspirations, increased effort, dullness to percussion, or overt respiratory distress.   CARDIOVASCULAR: S1, S2 appreciated. No murmurs, gallops, or rubs noted. PMI not lateralized. Chest is nontender to palpation. 1+ pedal pulses. No lower extremity edema, calf tenderness, or cyanosis.   ABDOMEN: Soft, nontender. Bowel sounds are present. No hepatosplenomegaly or masses were noted.   RECTAL: Deferred.   MUSCULOSKELETAL: Able to move all extremities. No cyanosis, degenerative joint disease, or kyphosis. Gait is not tested.   SKIN: Skin did not reveal any rashes, lesions, erythema, nodularity, or induration. It was warm and dry to palpation.   LYMPH: No adenopathy in the cervical region.   NEUROLOGICAL: Cranial nerves grossly intact. Sensory is intact. No dysarthria or aphasia. The patient is alert, oriented to time, person, and place, cooperative. Memory is good.   PSYCHIATRIC: No significant confusion, agitation, or depression noted.   LABORATORY, DIAGNOSTIC, AND RADIOLOGICAL DATA: BMP showed glucose of 103. Magnesium level 1.7. Lipase level 204.  Liver enzymes were normal. Cardiac enzymes showed troponin elevation to 0.09 and CK total as well as MB fractions were within normal limits. White blood cell count is slightly elevated to 12.7, hemoglobin 16.3, platelets 234. Coagulation panel within normal limits. EKG normal sinus rhythm at 63 beats per minute, normal axis, no acute ST-T changes were noted. Chest x-ray, portable, single view, 06/15/2011, mild hyperinflation which may reflect underlying COPD or reactive airway disease. No evidence of CHF or pneumonia, maybe minimal subsegmental atelectasis on the right infrahilar region according to the radiologist.   ASSESSMENT AND PLAN:  1. Chest pain. Admit patient to medical floor. Observation for now. Will start patient on beta-blockers, nitroglycerin, as well as Lovenox 1 mg/kg twice daily dose. Will continue aspirin therapy. Will ask cardiologist to see patient. Questionable stress test if troponin elevation is minimal.  2. Elevated troponin. As above will likely have stress test done tomorrow if troponin does not seem to be increasing.  3. Hyperlipidemia. Continue Lipitor.  4. Gastroesophageal reflux disease. Continue PPI.  5. Tobacco abuse. This was discussed with the patient for five minutes. He refuses nicotine replacement therapy, says that he is able to quit by himself and is okay to try to do it. He does not seem to be concerned about any withdrawal symptoms.   TIME SPENT: 50 minutes.   ____________________________ Katharina Caper, MD rv:drc D: 06/15/2011 09:45:39 ET T: 06/15/2011 10:30:00 ET JOB#: [id number]  cc: Katharina Caper, MD, <Dictator> Katina Dung. Dayna Barker, MD Katharina Caper MD ELECTRONICALLY SIGNED 06/15/2011 13:43

## 2014-07-16 NOTE — Discharge Summary (Signed)
PATIENT NAME:  Macon, [name] MR#:  [id number] DATE OF BIRTH:  03/21/1966  DATE OF ADMISSION:  06/15/2011 DATE OF DISCHARGE:  06/16/2011  ADMITTING PHYSICIAN: Katharina Caperima Vaickute, MD   DISCHARGING PHYSICIAN: Larena GlassmanAmir Almarie Kurdziel, MD   PRIMARY CARE PHYSICIAN: Leim FabryBarbara Aldridge, MD   ADMITTING DIAGNOSIS: Chest pain with elevated troponins.  DISCHARGE DIAGNOSES:  1. Chest pain. 2. Elevated troponin.  3. Hyperlipidemia.  4. Gastroesophageal reflux disease. 5. Tobacco abuse. 6. Hyperglycemia.  7. Hypomagnesemia.   TESTS DONE DURING THIS HOSPITALIZATION:  1. Chest x-ray 06/15/2011 showed mild hyperinflation which may reflect chronic obstructive pulmonary disease or underlying airway disease. 2. Nuclear stress test 03/25 showed no significant ischemia.   CONSULTANTS:  1. Dr. Dorothyann Pengwayne Callwood  2. Case Management    HOSPITAL COURSE: Initial history and physical were done by Dr. Winona LegatoVaickute. Please refer to her note dated 06/15/2011 for complete details. In brief, this is a 49 year old white male with past medical history significant for gastroesophageal reflux disease and history of hyperlipidemia who presented with chest pain and had elevated troponin. The patient was admitted to the hospitalist service.  1. Chest pain. He was given morphine, nitroglycerin, started on beta-blocker, aspirin, and put on full dose Lovenox. He was seen by Dr. Juliann Paresallwood who recommended stress testing which showed no significant ischemia. He recommended discharging on beta-blocker, aspirin, and increasing statin.  2. Elevated troponin. As stated above, this most likely was consistent with angina.  3. Hyperlipidemia. The patient's LDL was 136. We increased his Lipitor.  4. Gastroesophageal reflux disease. Continue PPI.  5. Hypomagnesemia. Continue p.o. supplementation.  6. Hyperglycemia. This most likely was stress reaction.  7. Tobacco abuse. The patient was counseled. Refused nicotine patch. Counseled for over five minutes.   8. DVT prophylaxis. Maintained with Lovenox and aspirin.   The patient was discharged on 06/16/2011. Temperature 97.7, heart rate 67, respiratory rate 18, blood pressure 99/73, sating 97% on room air. LUNGS: Clear to auscultation. CARDIOVASCULAR: Regular rate and rhythm. ABDOMEN: Benign.   DISCHARGE MEDICATIONS:  1. Celexa 10 mg daily. 2. Folic acid 1 mg daily.  3. Methotrexate 10 mg 1 tablet weekly.  4. Aspirin 81 mg daily.  5. Lipitor 20 mg daily.  6. Metoprolol 25 mg twice daily.  7. Nitroglycerin 0.4 mg sublingual q.6 minutes p.r.n. chest pain x3.   DIET: Low sodium, low fat.   ACTIVITY: No exertional activity for five days. Return to work 06/19/2011.  OXYGEN: None.  FOLLOW-UP: The patient is to see Dr. Dayna BarkerAldridge in one week and Dr. Juliann Paresallwood in two weeks.   CODE STATUS: FULL CODE.      TOTAL TIME SPENT ON DISCHARGE: 45 minutes.   ____________________________ Corie ChiquitoAmir A. [place] DragonFirozvi, MD aaf:drc D: 06/16/2011 16:27:50 ET T: 06/17/2011 12:50:56 ET JOB#: [id number]  cc: Karolee OhsAmir A. [place] DragonFirozvi, MD, <Dictator> Katina DungBarbara D. Dayna BarkerAldridge, MD Dwayne D. Juliann Paresallwood, MD Coralyn PearAMIR A Dasiah Hooley MD ELECTRONICALLY SIGNED 06/17/2011 13:04

## 2014-07-16 NOTE — Consult Note (Signed)
PATIENT NAME:  Andres [name], [name] MR#:  [id number] DATE OF BIRTH:  1965/09/19  DATE OF CONSULTATION:  06/15/2011  REFERRING PHYSICIAN:  Katharina Caperima Vaickute, MD  CONSULTING PHYSICIAN:  Dwayne D. Juliann Paresallwood, MD   PRIMARY CARE PHYSICIAN: Leim FabryBarbara Aldridge, MD   INDICATIONS: Chest pain, possible angina, elevated troponins, possible non-Q-wave myocardial infarction.   HISTORY OF PRESENT ILLNESS: Andres [name] is a 49 year old white male with a past history of GE reflux and hyperlipidemia who presented to the Emergency Room with chest pain. According to the patient, he was having chest pain over the last few days in the evenings right after he ate. He thought it was heartburn. Pain was described as midsternal ache, 7 out of 10, intermittent, constant discomfort approximately 10 to 15 minutes. It did not get any better. In the morning he awakened with recurrent pain and cold sweats, chest pain radiating to his left arm with nausea and vomiting. The patient felt the symptoms coming back and forth so he decided to come to the Emergency Room after about three episodes of it. Troponin in the ER was slightly elevated so he was admitted for further evaluation and care.   REVIEW OF SYSTEMS: No blackout spell or syncope. No nausea or vomiting. Denies fever, chills, sweats. No weight loss. No weight gain, hemoptysis, hematemesis. No bright red blood per rectum. No vision change or hearing change. Denies sputum production or cough.   PAST MEDICAL HISTORY:  1. Hyperlipidemia.  2. Reflux.  3. Anxiety. 4. Depression. 5. Psoriasis.   PAST SURGICAL HISTORY:  1. Septoplasty. 2. Esophagogastroduodenoscopy.  MEDICATIONS: 1. Prilosec over-the-counter. 2. Lipitor 10 mg a day.  3. Citalopram 10 mg a day.  4. Methotrexate 15 mg on Saturday. 5. Folic acid once a day.   ALLERGIES: None.   FAMILY HISTORY: Hypertension, diabetes, cerebrovascular accidents, colorectal cancer, lung cancer.   SOCIAL HISTORY: He is married. Truck  driver. Smoker. No alcohol consumption.   PHYSICAL EXAMINATION:   VITAL SIGNS: Blood pressure 135/82, pulse 75, respiratory rate 18, afebrile.   HEENT: Normocephalic, atraumatic. Pupils equal and reactive to light.   NECK: Supple. No JVD, bruits, or adenopathy.   LUNGS: Clear to auscultation and percussion. No significant wheeze, rhonchi, or rales.   HEART: Regular rate and rhythm. No significant murmur, gallops, or rubs.   ABDOMEN: Benign.   EXTREMITIES: Within normal limits.   NEUROLOGIC: Intact.   SKIN: Normal.   LABORATORY, DIAGNOSTIC, AND RADIOLOGICAL DATA: BNP 103. Magnesium 1.7. Lipase 204. LFTs negative. Troponin 0.09. CK normal. White count 12.7, hemoglobin 16, platelet count 234. Coags are normal. EKG normal sinus rhythm, nonspecific ST-T wave changes. Chest x-ray unremarkable.   ASSESSMENT:  1. Chest pain. 2. Elevated troponins.  3. Smoking.  4. Hyperlipidemia.  5. Family history. 6. Reflux.  7. Anxiety.   PLAN:  1. Agree with admit.  2. Rule out for myocardial infarction.  3. Follow-up cardiac enzymes.  4. Follow-up EKG. 5. Blood pressure control.  6. Aspirin therapy.  7. Echocardiogram to assess with LV function and wall motion.  8. Consider functional study to evaluate for significant ischemic burden.  9. Will base further evaluation on results of the functional study. 10. Treat the patient medically for now.   ____________________________ Bobbie Stackwayne D. Juliann Paresallwood, MD ddc:drc D: 06/16/2011 16:54:38 ET T: 06/16/2011 17:34:01 ET JOB#: [id number]  cc: Dwayne D. Juliann Paresallwood, MD, <Dictator> Alwyn PeaWAYNE D CALLWOOD MD ELECTRONICALLY SIGNED 07/02/2011 11:41

## 2014-11-02 DIAGNOSIS — I2510 Atherosclerotic heart disease of native coronary artery without angina pectoris: Secondary | ICD-10-CM

## 2015-05-30 ENCOUNTER — Other Ambulatory Visit: Payer: Self-pay | Admitting: Family Medicine

## 2015-05-30 DIAGNOSIS — R9389 Abnormal findings on diagnostic imaging of other specified body structures: Secondary | ICD-10-CM

## 2015-06-13 ENCOUNTER — Ambulatory Visit: Payer: Managed Care, Other (non HMO)

## 2015-06-26 ENCOUNTER — Ambulatory Visit: Admission: RE | Admit: 2015-06-26 | Payer: Managed Care, Other (non HMO) | Source: Ambulatory Visit

## 2017-04-03 ENCOUNTER — Ambulatory Visit
Admission: RE | Admit: 2017-04-03 | Discharge: 2017-04-03 | Disposition: A | Discharge status: Home / Self Care | Payer: Managed Care, Other (non HMO) | Source: Ambulatory Visit | Attending: Medical Oncology | Admitting: Medical Oncology

## 2017-04-03 ENCOUNTER — Other Ambulatory Visit: Payer: Self-pay | Admitting: Medical Oncology

## 2017-04-03 DIAGNOSIS — J111 Influenza due to unidentified influenza virus with other respiratory manifestations: Secondary | ICD-10-CM | POA: Diagnosis not present

## 2017-04-03 DIAGNOSIS — R0989 Other specified symptoms and signs involving the circulatory and respiratory systems: Secondary | ICD-10-CM

## 2018-05-11 ENCOUNTER — Other Ambulatory Visit
Admission: RE | Admit: 2018-05-11 | Discharge: 2018-05-11 | Disposition: A | Discharge status: Home / Self Care | Payer: Managed Care, Other (non HMO) | Attending: Dermatology | Admitting: Dermatology

## 2018-05-11 DIAGNOSIS — L400 Psoriasis vulgaris: Secondary | ICD-10-CM | POA: Insufficient documentation

## 2018-05-11 LAB — CBC WITH DIFFERENTIAL/PLATELET
Abs Immature Granulocytes: 0.03 10*3/uL (ref 0.00–0.07)
BASOS ABS: 0.1 10*3/uL (ref 0.0–0.1)
Basophils Relative: 1 %
Eosinophils Absolute: 0.4 10*3/uL (ref 0.0–0.5)
Eosinophils Relative: 6 %
HCT: 44.1 % (ref 39.0–52.0)
Hemoglobin: 15.7 g/dL (ref 13.0–17.0)
Immature Granulocytes: 0 %
Lymphocytes Relative: 23 %
Lymphs Abs: 1.6 10*3/uL (ref 0.7–4.0)
MCH: 30.2 pg (ref 26.0–34.0)
MCHC: 35.6 g/dL (ref 30.0–36.0)
MCV: 84.8 fL (ref 80.0–100.0)
Monocytes Absolute: 0.6 10*3/uL (ref 0.1–1.0)
Monocytes Relative: 8 %
NRBC: 0 % (ref 0.0–0.2)
Neutro Abs: 4.4 10*3/uL (ref 1.7–7.7)
Neutrophils Relative %: 62 %
Platelets: 232 10*3/uL (ref 150–400)
RBC: 5.2 MIL/uL (ref 4.22–5.81)
RDW: 12.6 % (ref 11.5–15.5)
WBC: 7.1 10*3/uL (ref 4.0–10.5)

## 2018-05-12 LAB — HEPATIC FUNCTION PANEL
ALT: 23 U/L (ref 0–44)
AST: 21 U/L (ref 15–41)
Albumin: 3.9 g/dL (ref 3.5–5.0)
Alkaline Phosphatase: 58 U/L (ref 38–126)
Total Bilirubin: 0.6 mg/dL (ref 0.3–1.2)
Total Protein: 7.1 g/dL (ref 6.5–8.1)

## 2018-05-12 LAB — HEPATITIS B SURFACE ANTIBODY, QUANTITATIVE: Hepatitis B-Post: <3.1 m[IU]/mL — ABNORMAL LOW (ref >9.9)

## 2018-05-12 LAB — HEPATITIS C ANTIBODY (REFLEX): HCV Ab: <0.1 s/co ratio (ref 0.0–0.9)

## 2018-05-12 LAB — HCV COMMENT:

## 2018-05-13 LAB — QUANTIFERON-TB GOLD PLUS: QUANTIFERON-TB GOLD PLUS: NEGATIVE

## 2018-05-13 LAB — QUANTIFERON-TB GOLD PLUS (RQFGPL)
QuantiFERON Mitogen Value: >10 IU/mL
QuantiFERON Nil Value: 0.02 IU/mL
QuantiFERON TB1 Ag Value: 0.02 IU/mL
QuantiFERON TB2 Ag Value: 0.02 IU/mL

## 2019-04-19 ENCOUNTER — Other Ambulatory Visit
Admission: RE | Admit: 2019-04-19 | Discharge: 2019-04-19 | Disposition: A | Discharge status: Home / Self Care | Payer: Managed Care, Other (non HMO) | Attending: Dermatology | Admitting: Dermatology

## 2019-04-19 DIAGNOSIS — L400 Psoriasis vulgaris: Secondary | ICD-10-CM | POA: Diagnosis present

## 2019-04-19 LAB — COMPREHENSIVE METABOLIC PANEL
ALT: 24 U/L (ref 0–44)
AST: 22 U/L (ref 15–41)
Albumin: 4 g/dL (ref 3.5–5.0)
Alkaline Phosphatase: 59 U/L (ref 38–126)
Anion gap: 10 (ref 5–15)
BUN: 17 mg/dL (ref 6–20)
CO2: 20 mmol/L — ABNORMAL LOW (ref 22–32)
Calcium: 9 mg/dL (ref 8.9–10.3)
Chloride: 106 mmol/L (ref 98–111)
Creatinine, Ser: 0.9 mg/dL (ref 0.61–1.24)
GFR calc Af Amer: >60 mL/min (ref >60)
GFR calc non Af Amer: >60 mL/min (ref >60)
Glucose, Bld: 98 mg/dL (ref 70–99)
Potassium: 3.9 mmol/L (ref 3.5–5.1)
Sodium: 136 mmol/L (ref 135–145)
Total Bilirubin: 0.7 mg/dL (ref 0.3–1.2)
Total Protein: 7.5 g/dL (ref 6.5–8.1)

## 2019-04-20 LAB — HCV RNA QUANT: HCV Quantitative: NOT DETECTED IU/mL (ref >50)

## 2019-04-21 LAB — QUANTIFERON-TB GOLD PLUS (RQFGPL)
QuantiFERON Mitogen Value: >10 IU/mL
QuantiFERON Nil Value: 0.05 IU/mL
QuantiFERON TB1 Ag Value: 0.04 IU/mL
QuantiFERON TB2 Ag Value: 0.05 IU/mL

## 2019-04-21 LAB — QUANTIFERON-TB GOLD PLUS: QuantiFERON-TB Gold Plus: NEGATIVE

## 2019-04-25 LAB — HEPATITIS B SURFACE ANTIBODY, QUANTITATIVE: Hep B S AB Quant (Post): <3.1 m[IU]/mL — ABNORMAL LOW (ref >9.9)

## 2020-01-24 ENCOUNTER — Other Ambulatory Visit: Payer: Self-pay | Admitting: Physician Assistant

## 2020-01-24 ENCOUNTER — Ambulatory Visit
Admission: RE | Admit: 2020-01-24 | Discharge: 2020-01-24 | Disposition: A | Discharge status: Home / Self Care | Payer: Self-pay | Source: Ambulatory Visit | Attending: Physician Assistant | Admitting: Physician Assistant

## 2020-01-24 DIAGNOSIS — Z0289 Encounter for other administrative examinations: Secondary | ICD-10-CM

## 2020-03-27 ENCOUNTER — Other Ambulatory Visit
Admission: RE | Admit: 2020-03-27 | Discharge: 2020-03-27 | Disposition: A | Discharge status: Home / Self Care | Payer: Managed Care, Other (non HMO) | Attending: Dermatology | Admitting: Dermatology

## 2020-03-27 DIAGNOSIS — L400 Psoriasis vulgaris: Secondary | ICD-10-CM | POA: Insufficient documentation

## 2020-03-27 LAB — CBC WITH DIFFERENTIAL/PLATELET
Abs Immature Granulocytes: 0.06 10*3/uL (ref 0.00–0.07)
Basophils Absolute: 0.1 10*3/uL (ref 0.0–0.1)
Basophils Relative: 1 %
Eosinophils Absolute: 0.4 10*3/uL (ref 0.0–0.5)
Eosinophils Relative: 4 %
HCT: 43.7 % (ref 39.0–52.0)
Hemoglobin: 15.4 g/dL (ref 13.0–17.0)
Immature Granulocytes: 1 %
Lymphocytes Relative: 24 %
Lymphs Abs: 2.5 10*3/uL (ref 0.7–4.0)
MCH: 29.9 pg (ref 26.0–34.0)
MCHC: 35.2 g/dL (ref 30.0–36.0)
MCV: 84.9 fL (ref 80.0–100.0)
Monocytes Absolute: 0.8 10*3/uL (ref 0.1–1.0)
Monocytes Relative: 7 %
Neutro Abs: 6.9 10*3/uL (ref 1.7–7.7)
Neutrophils Relative %: 63 %
Platelets: 250 10*3/uL (ref 150–400)
RBC: 5.15 MIL/uL (ref 4.22–5.81)
RDW: 12.7 % (ref 11.5–15.5)
WBC: 10.7 10*3/uL — ABNORMAL HIGH (ref 4.0–10.5)
nRBC: 0 % (ref 0.0–0.2)

## 2020-03-27 LAB — COMPREHENSIVE METABOLIC PANEL
ALT: 19 U/L (ref 0–44)
AST: 19 U/L (ref 15–41)
Albumin: 4.5 g/dL (ref 3.5–5.0)
Alkaline Phosphatase: 55 U/L (ref 38–126)
Anion gap: 10 (ref 5–15)
BUN: 12 mg/dL (ref 6–20)
CO2: 24 mmol/L (ref 22–32)
Calcium: 9.3 mg/dL (ref 8.9–10.3)
Chloride: 103 mmol/L (ref 98–111)
Creatinine, Ser: 0.91 mg/dL (ref 0.61–1.24)
GFR, Estimated: >60 mL/min (ref >60)
Glucose, Bld: 82 mg/dL (ref 70–99)
Potassium: 3.8 mmol/L (ref 3.5–5.1)
Sodium: 137 mmol/L (ref 135–145)
Total Bilirubin: 0.7 mg/dL (ref 0.3–1.2)
Total Protein: 7.6 g/dL (ref 6.5–8.1)

## 2020-03-29 LAB — QUANTIFERON-TB GOLD PLUS (RQFGPL)
QuantiFERON Mitogen Value: >10 IU/mL
QuantiFERON Nil Value: 0.1 IU/mL
QuantiFERON TB1 Ag Value: 0.02 IU/mL
QuantiFERON TB2 Ag Value: 0.03 IU/mL

## 2020-03-29 LAB — QUANTIFERON-TB GOLD PLUS: QuantiFERON-TB Gold Plus: NEGATIVE

## 2021-03-24 IMAGING — CR DG CHEST 1V
1 series · 1 of 1 positions shown · non-contrast
Comparison: April 03, 2017.

CLINICAL DATA: Employee physical examination.

EXAM:
CHEST  1 VIEW

[chest pa]
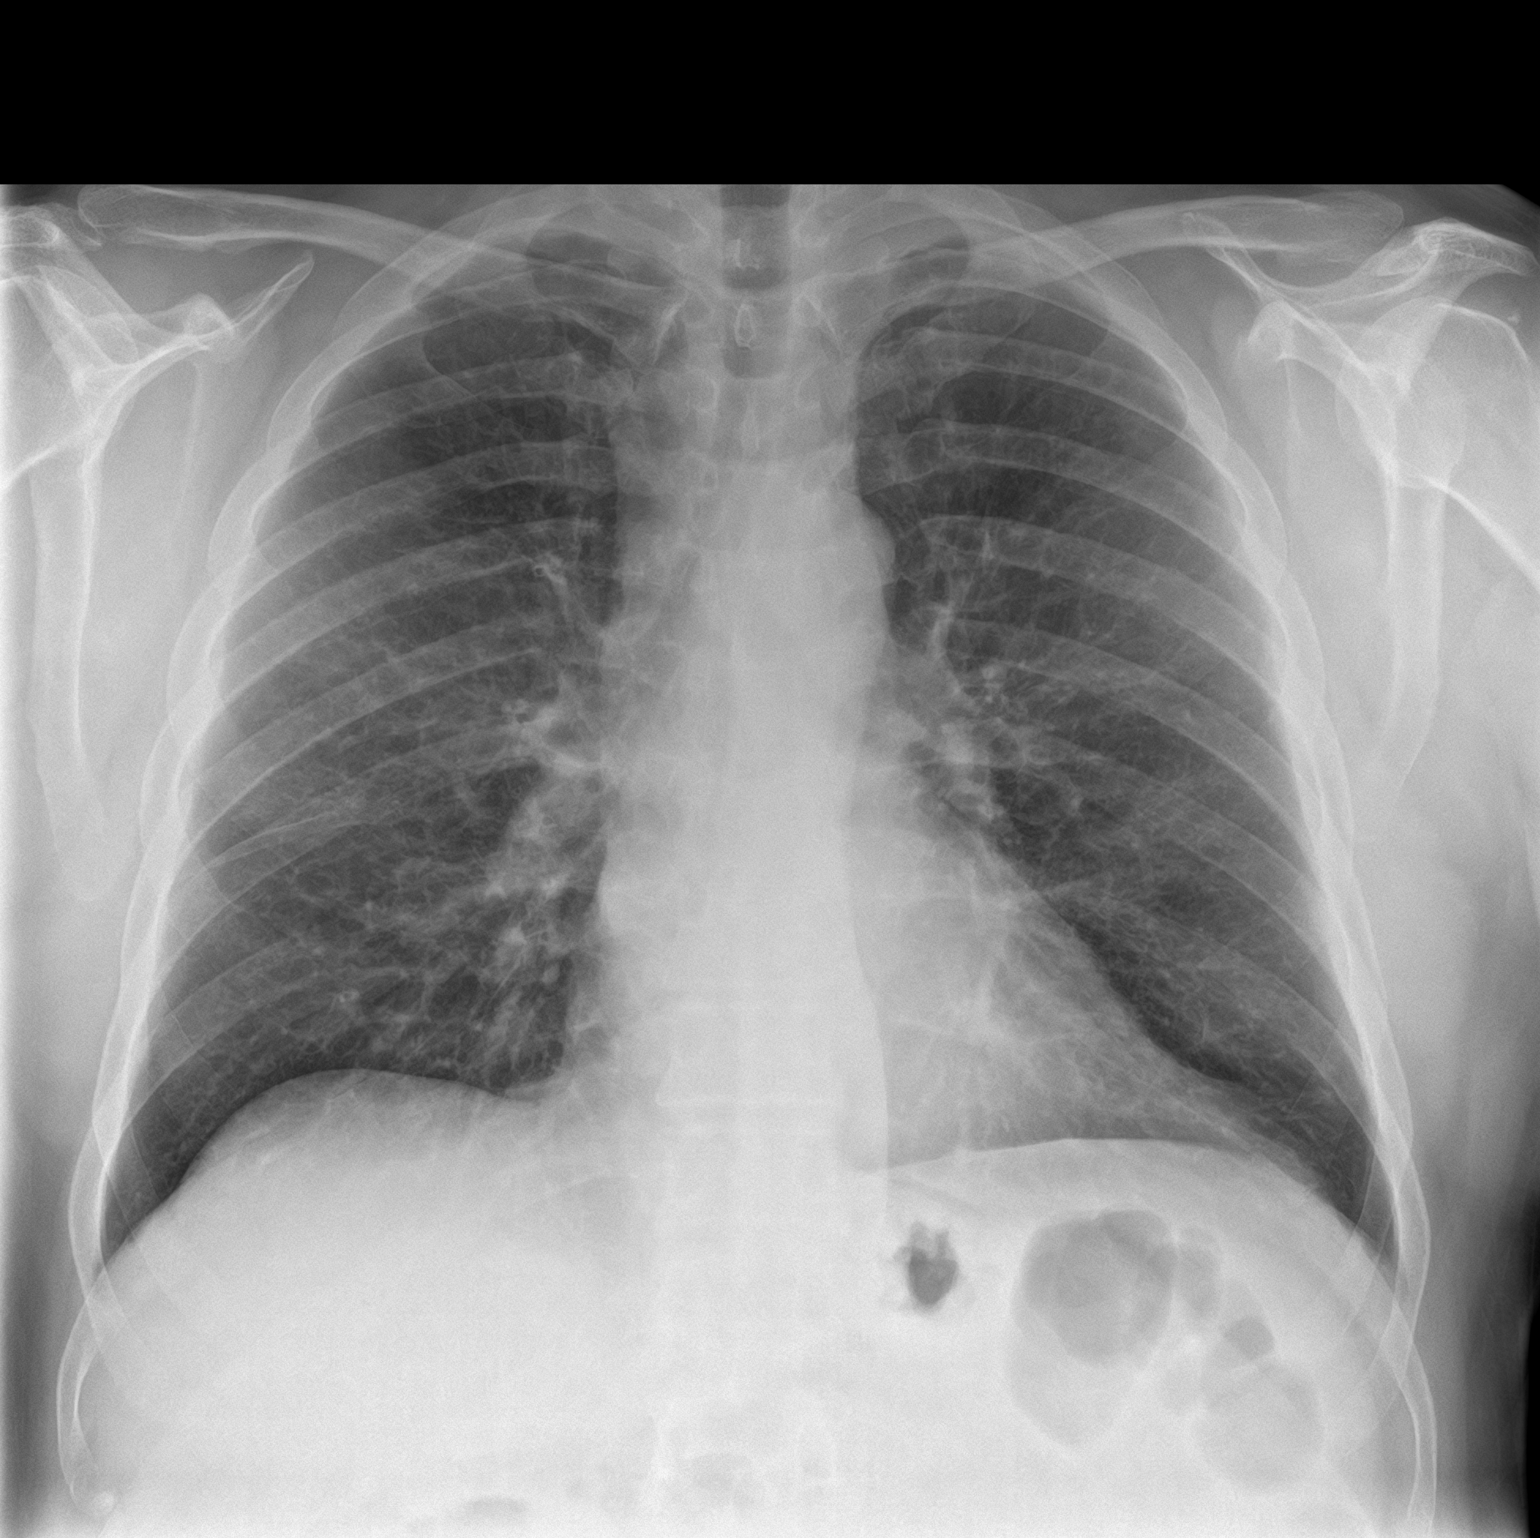

[1 of 1 positions shown; findings below may reference images not displayed]

FINDINGS: The heart size and mediastinal contours are within normal limits.
Both lungs are clear. The visualized skeletal structures are
unremarkable.
IMPRESSION: No active disease.

## 2021-08-06 ENCOUNTER — Other Ambulatory Visit
Admission: RE | Admit: 2021-08-06 | Discharge: 2021-08-06 | Disposition: A | Discharge status: Home / Self Care | Payer: BC Managed Care – PPO | Attending: Dermatology | Admitting: Dermatology

## 2021-08-06 DIAGNOSIS — L4050 Arthropathic psoriasis, unspecified: Secondary | ICD-10-CM | POA: Diagnosis not present

## 2021-08-06 DIAGNOSIS — L409 Psoriasis, unspecified: Secondary | ICD-10-CM | POA: Insufficient documentation

## 2021-08-06 DIAGNOSIS — L400 Psoriasis vulgaris: Secondary | ICD-10-CM | POA: Diagnosis not present

## 2021-08-06 DIAGNOSIS — Z79899 Other long term (current) drug therapy: Secondary | ICD-10-CM | POA: Diagnosis not present

## 2021-08-06 LAB — CBC WITH DIFFERENTIAL/PLATELET
Abs Immature Granulocytes: 0.03 10*3/uL (ref 0.00–0.07)
Basophils Absolute: 0.1 10*3/uL (ref 0.0–0.1)
Basophils Relative: 1 %
Eosinophils Absolute: 0.4 10*3/uL (ref 0.0–0.5)
Eosinophils Relative: 5 %
HCT: 40.5 % (ref 39.0–52.0)
Hemoglobin: 14.3 g/dL (ref 13.0–17.0)
Immature Granulocytes: 0 %
Lymphocytes Relative: 25 %
Lymphs Abs: 2 10*3/uL (ref 0.7–4.0)
MCH: 29.7 pg (ref 26.0–34.0)
MCHC: 35.3 g/dL (ref 30.0–36.0)
MCV: 84.2 fL (ref 80.0–100.0)
Monocytes Absolute: 0.6 10*3/uL (ref 0.1–1.0)
Monocytes Relative: 7 %
Neutro Abs: 5.1 10*3/uL (ref 1.7–7.7)
Neutrophils Relative %: 62 %
Platelets: 261 10*3/uL (ref 150–400)
RBC: 4.81 MIL/uL (ref 4.22–5.81)
RDW: 13 % (ref 11.5–15.5)
WBC: 8.2 10*3/uL (ref 4.0–10.5)
nRBC: 0 % (ref 0.0–0.2)

## 2021-08-06 LAB — COMPREHENSIVE METABOLIC PANEL
ALT: 16 U/L (ref 0–44)
AST: 15 U/L (ref 15–41)
Albumin: 3.8 g/dL (ref 3.5–5.0)
Alkaline Phosphatase: 57 U/L (ref 38–126)
Anion gap: 9 (ref 5–15)
BUN: 13 mg/dL (ref 6–20)
CO2: 22 mmol/L (ref 22–32)
Calcium: 9.3 mg/dL (ref 8.9–10.3)
Chloride: 105 mmol/L (ref 98–111)
Creatinine, Ser: 1.03 mg/dL (ref 0.61–1.24)
GFR, Estimated: >60 mL/min (ref >60)
Glucose, Bld: 98 mg/dL (ref 70–99)
Potassium: 3.4 mmol/L — ABNORMAL LOW (ref 3.5–5.1)
Sodium: 136 mmol/L (ref 135–145)
Total Bilirubin: 0.5 mg/dL (ref 0.3–1.2)
Total Protein: 7.2 g/dL (ref 6.5–8.1)

## 2021-08-06 LAB — HEPATITIS PANEL, ACUTE
HCV Ab: NONREACTIVE
Hep A IgM: NONREACTIVE
Hep B C IgM: NONREACTIVE
Hepatitis B Surface Ag: NONREACTIVE

## 2021-08-07 LAB — CALCITRIOL (1,25 DI-OH VIT D): Vit D, 1,25-Dihydroxy: 31.7 pg/mL (ref 24.8–81.5)

## 2021-08-10 LAB — QUANTIFERON-TB GOLD PLUS (RQFGPL)
QuantiFERON Mitogen Value: >10 IU/mL
QuantiFERON Nil Value: 0 IU/mL
QuantiFERON TB1 Ag Value: 0 IU/mL
QuantiFERON TB2 Ag Value: 0.01 IU/mL

## 2021-08-10 LAB — QUANTIFERON-TB GOLD PLUS: QuantiFERON-TB Gold Plus: NEGATIVE

## 2022-01-02 ENCOUNTER — Inpatient Hospital Stay
Admission: EM | Admit: 2022-01-02 | Discharge: 2022-01-04 | DRG: 286 | Disposition: A | Discharge status: Home / Self Care | Payer: BC Managed Care – PPO | Attending: Internal Medicine | Admitting: Internal Medicine

## 2022-01-02 ENCOUNTER — Emergency Department: Payer: BC Managed Care – PPO

## 2022-01-02 DIAGNOSIS — J209 Acute bronchitis, unspecified: Secondary | ICD-10-CM | POA: Diagnosis present

## 2022-01-02 DIAGNOSIS — L409 Psoriasis, unspecified: Secondary | ICD-10-CM

## 2022-01-02 DIAGNOSIS — I4891 Unspecified atrial fibrillation: Principal | ICD-10-CM

## 2022-01-02 DIAGNOSIS — I2510 Atherosclerotic heart disease of native coronary artery without angina pectoris: Secondary | ICD-10-CM | POA: Diagnosis not present

## 2022-01-02 DIAGNOSIS — J441 Chronic obstructive pulmonary disease with (acute) exacerbation: Secondary | ICD-10-CM | POA: Diagnosis not present

## 2022-01-02 DIAGNOSIS — R079 Chest pain, unspecified: Secondary | ICD-10-CM | POA: Diagnosis not present

## 2022-01-02 DIAGNOSIS — J9601 Acute respiratory failure with hypoxia: Secondary | ICD-10-CM | POA: Diagnosis not present

## 2022-01-02 DIAGNOSIS — G4733 Obstructive sleep apnea (adult) (pediatric): Secondary | ICD-10-CM | POA: Diagnosis present

## 2022-01-02 DIAGNOSIS — I5031 Acute diastolic (congestive) heart failure: Secondary | ICD-10-CM | POA: Diagnosis not present

## 2022-01-02 DIAGNOSIS — Z6833 Body mass index (BMI) 33.0-33.9, adult: Secondary | ICD-10-CM | POA: Diagnosis not present

## 2022-01-02 DIAGNOSIS — I340 Nonrheumatic mitral (valve) insufficiency: Secondary | ICD-10-CM | POA: Diagnosis present

## 2022-01-02 DIAGNOSIS — E876 Hypokalemia: Secondary | ICD-10-CM | POA: Diagnosis not present

## 2022-01-02 DIAGNOSIS — I4819 Other persistent atrial fibrillation: Secondary | ICD-10-CM | POA: Diagnosis not present

## 2022-01-02 DIAGNOSIS — I472 Ventricular tachycardia, unspecified: Secondary | ICD-10-CM | POA: Diagnosis not present

## 2022-01-02 DIAGNOSIS — I2489 Other forms of acute ischemic heart disease: Secondary | ICD-10-CM | POA: Diagnosis present

## 2022-01-02 DIAGNOSIS — T502X5A Adverse effect of carbonic-anhydrase inhibitors, benzothiadiazides and other diuretics, initial encounter: Secondary | ICD-10-CM | POA: Diagnosis not present

## 2022-01-02 DIAGNOSIS — I249 Acute ischemic heart disease, unspecified: Secondary | ICD-10-CM | POA: Diagnosis not present

## 2022-01-02 DIAGNOSIS — R918 Other nonspecific abnormal finding of lung field: Secondary | ICD-10-CM | POA: Diagnosis not present

## 2022-01-02 DIAGNOSIS — F17200 Nicotine dependence, unspecified, uncomplicated: Secondary | ICD-10-CM

## 2022-01-02 DIAGNOSIS — R0602 Shortness of breath: Secondary | ICD-10-CM | POA: Diagnosis not present

## 2022-01-02 DIAGNOSIS — J439 Emphysema, unspecified: Secondary | ICD-10-CM | POA: Diagnosis not present

## 2022-01-02 DIAGNOSIS — Z79899 Other long term (current) drug therapy: Secondary | ICD-10-CM

## 2022-01-02 DIAGNOSIS — I110 Hypertensive heart disease with heart failure: Secondary | ICD-10-CM | POA: Diagnosis not present

## 2022-01-02 DIAGNOSIS — I499 Cardiac arrhythmia, unspecified: Secondary | ICD-10-CM | POA: Diagnosis not present

## 2022-01-02 DIAGNOSIS — Z955 Presence of coronary angioplasty implant and graft: Secondary | ICD-10-CM

## 2022-01-02 DIAGNOSIS — R300 Dysuria: Secondary | ICD-10-CM | POA: Diagnosis not present

## 2022-01-02 DIAGNOSIS — K219 Gastro-esophageal reflux disease without esophagitis: Secondary | ICD-10-CM | POA: Diagnosis present

## 2022-01-02 DIAGNOSIS — I255 Ischemic cardiomyopathy: Secondary | ICD-10-CM | POA: Diagnosis present

## 2022-01-02 DIAGNOSIS — Z9861 Coronary angioplasty status: Secondary | ICD-10-CM | POA: Diagnosis not present

## 2022-01-02 DIAGNOSIS — I517 Cardiomegaly: Secondary | ICD-10-CM | POA: Diagnosis not present

## 2022-01-02 DIAGNOSIS — Z888 Allergy status to other drugs, medicaments and biological substances status: Secondary | ICD-10-CM

## 2022-01-02 DIAGNOSIS — I4892 Unspecified atrial flutter: Secondary | ICD-10-CM | POA: Diagnosis present

## 2022-01-02 DIAGNOSIS — I509 Heart failure, unspecified: Secondary | ICD-10-CM | POA: Diagnosis not present

## 2022-01-02 DIAGNOSIS — Z1152 Encounter for screening for COVID-19: Secondary | ICD-10-CM | POA: Diagnosis not present

## 2022-01-02 DIAGNOSIS — J440 Chronic obstructive pulmonary disease with acute lower respiratory infection: Secondary | ICD-10-CM | POA: Diagnosis present

## 2022-01-02 DIAGNOSIS — E785 Hyperlipidemia, unspecified: Secondary | ICD-10-CM | POA: Diagnosis not present

## 2022-01-02 DIAGNOSIS — J449 Chronic obstructive pulmonary disease, unspecified: Secondary | ICD-10-CM | POA: Diagnosis not present

## 2022-01-02 DIAGNOSIS — I214 Non-ST elevation (NSTEMI) myocardial infarction: Secondary | ICD-10-CM | POA: Diagnosis not present

## 2022-01-02 DIAGNOSIS — I700 Atherosclerosis of aorta: Secondary | ICD-10-CM | POA: Diagnosis present

## 2022-01-02 DIAGNOSIS — R7989 Other specified abnormal findings of blood chemistry: Secondary | ICD-10-CM | POA: Diagnosis not present

## 2022-01-02 DIAGNOSIS — F17210 Nicotine dependence, cigarettes, uncomplicated: Secondary | ICD-10-CM | POA: Diagnosis present

## 2022-01-02 DIAGNOSIS — R0902 Hypoxemia: Secondary | ICD-10-CM | POA: Diagnosis not present

## 2022-01-02 DIAGNOSIS — E669 Obesity, unspecified: Secondary | ICD-10-CM | POA: Diagnosis present

## 2022-01-02 DIAGNOSIS — Z7982 Long term (current) use of aspirin: Secondary | ICD-10-CM

## 2022-01-02 DIAGNOSIS — I25118 Atherosclerotic heart disease of native coronary artery with other forms of angina pectoris: Secondary | ICD-10-CM | POA: Diagnosis not present

## 2022-01-02 DIAGNOSIS — I252 Old myocardial infarction: Secondary | ICD-10-CM

## 2022-01-02 DIAGNOSIS — I25119 Atherosclerotic heart disease of native coronary artery with unspecified angina pectoris: Secondary | ICD-10-CM | POA: Diagnosis not present

## 2022-01-02 DIAGNOSIS — J811 Chronic pulmonary edema: Secondary | ICD-10-CM | POA: Diagnosis not present

## 2022-01-02 DIAGNOSIS — I5033 Acute on chronic diastolic (congestive) heart failure: Secondary | ICD-10-CM | POA: Diagnosis not present

## 2022-01-02 DIAGNOSIS — R0789 Other chest pain: Secondary | ICD-10-CM | POA: Diagnosis not present

## 2022-01-02 DIAGNOSIS — I480 Paroxysmal atrial fibrillation: Secondary | ICD-10-CM | POA: Diagnosis not present

## 2022-01-02 LAB — CBC
HCT: 44 % (ref 39.0–52.0)
Hemoglobin: 15.2 g/dL (ref 13.0–17.0)
MCH: 30 pg (ref 26.0–34.0)
MCHC: 34.5 g/dL (ref 30.0–36.0)
MCV: 87 fL (ref 80.0–100.0)
Platelets: 235 10*3/uL (ref 150–400)
RBC: 5.06 MIL/uL (ref 4.22–5.81)
RDW: 12.9 % (ref 11.5–15.5)
WBC: 9.7 10*3/uL (ref 4.0–10.5)
nRBC: 0 % (ref 0.0–0.2)

## 2022-01-02 MED ORDER — DILTIAZEM HCL 25 MG/5ML IV SOLN
20.0000 mg | Freq: Once | INTRAVENOUS | Status: AC
  Administered 2022-01-02: 20 mg via INTRAVENOUS
  Filled 2022-01-02: qty 5

## 2022-01-02 MED ORDER — METHYLPREDNISOLONE SODIUM SUCC 125 MG IJ SOLR
125.0000 mg | Freq: Once | INTRAMUSCULAR | Status: AC
  Administered 2022-01-03: 125 mg via INTRAVENOUS
  Filled 2022-01-02: qty 2

## 2022-01-02 MED ORDER — IPRATROPIUM-ALBUTEROL 0.5-2.5 (3) MG/3ML IN SOLN
3.0000 mL | Freq: Once | RESPIRATORY_TRACT | Status: AC
  Administered 2022-01-03: 3 mL via RESPIRATORY_TRACT
  Filled 2022-01-02: qty 3

## 2022-01-02 NOTE — ED Triage Notes (Signed)
Pt presents via POV with complaints of CP that started 45 mins ago. Left sided CP with radiation to his arm. He notes having stent placement due to MI in 2013. Pt took 1 nitro PTA without improvement. Endorses "stabbing/sharp" pain. Denies SOB.

## 2022-01-02 NOTE — ED Provider Notes (Signed)
[facility] Provider Note    Event Date/Time   First MD Initiated Contact with Patient 01/02/22 2333     (approximate)   History   Chest Pain   HPI  Andres [name] is a 56 y.o. male who presents to the ED for evaluation of Chest Pain   I reviewed a clinic visit from March where patient was evaluated for a viral sinusitis.  History of CAD and a documented episode of A-fib in 2015.  Patient presents a 1 PPD smoker history and has been smoking for a long time but fails to tell me exactly how many years.  No formal diagnosis of COPD.  Patient presents to the ED for evaluation of chest discomfort and palpitations just in the past 1 hour.  Denies syncope, falls or trauma.  Denies recent illnesses, but does reluctantly indicate that he has had increased dyspnea on exertion and nonproductive cough recently.  No increased lower extremity swelling or chest pain on exertion.   Physical Exam   Triage Vital Signs: ED Triage Vitals  Enc Vitals Group     BP 01/02/22 2336 121/87     Pulse Rate 01/02/22 2336 (!) 168     Resp 01/02/22 2336 20     Temp 01/02/22 2336 97.6 F (36.4 C)     Temp src --      SpO2 01/02/22 2336 96 %     Weight 01/02/22 2336 252 lb (114.3 kg)     Height 01/02/22 2336 6' (1.829 m)     Head Circumference --      Peak Flow --      Pain Score 01/02/22 2335 7     Pain Loc --      Pain Edu? --      Excl. in [place]? --     Most recent vital signs: Vitals:   01/03/22 0025 01/03/22 0100  BP: 126/78 106/79  Pulse: (!) 156 (!) 148  Resp: (!) 24 17  Temp:    SpO2: 95% 90%    General: Awake, no distress.  Looks well CV:  Good peripheral perfusion.  Tachycardic and irregular Resp:  Normal effort.  Wheezing throughout with slightly decreased airflow. Abd:  No distention.  MSK:  No deformity noted.  No edema Neuro:  No focal deficits appreciated. Other:     ED Results / Procedures / Treatments   Labs (all labs ordered are listed, but  only abnormal results are displayed) Labs Reviewed  BASIC METABOLIC PANEL - Abnormal; Notable for the following components:      Result Value   Glucose, Bld 171 (*)    All other components within normal limits  BRAIN NATRIURETIC PEPTIDE - Abnormal; Notable for the following components:   B Natriuretic Peptide 166.6 (*)    All other components within normal limits  TROPONIN I (HIGH SENSITIVITY) - Abnormal; Notable for the following components:   Troponin I (High Sensitivity) 21 (*)    All other components within normal limits  CULTURE, BLOOD (SINGLE)  CBC  TROPONIN I (HIGH SENSITIVITY)    EKG A-fib with RV RR, rate of 159 bpm.  Normal axis and appropriate intervals.  Nonspecific ST changes inferiorly and laterally without STEMI.  Likely rate related.  RADIOLOGY CXR interpreted by me without evidence of acute cardiopulmonary pathology.  Official radiology report(s): DG Chest Port 1 View  Result Date: 01/03/2022 CLINICAL DATA:  Chest pain EXAM: PORTABLE CHEST 1 VIEW COMPARISON:  01/24/2020 FINDINGS: Lungs are clear.  No pleural effusion or pneumothorax. The heart is normal in size. IMPRESSION: No evidence of acute cardiopulmonary disease. Electronically Signed   By: Charline Bills M.D.   On: 01/03/2022 00:02    PROCEDURES and INTERVENTIONS:  .1-3 Lead EKG Interpretation  Performed by: Delton Prairie, MD Authorized by: Delton Prairie, MD     Interpretation: abnormal     ECG rate:  160   ECG rate assessment: tachycardic     Rhythm: atrial fibrillation     Ectopy: none     Conduction: normal   .Critical Care  Performed by: Delton Prairie, MD Authorized by: Delton Prairie, MD   Critical care provider statement:    Critical care time (minutes):  30   Critical care time was exclusive of:  Separately billable procedures and treating other patients   Critical care was necessary to treat or prevent imminent or life-threatening deterioration of the following conditions:  Cardiac  failure   Critical care was time spent personally by me on the following activities:  Development of treatment plan with patient or surrogate, discussions with consultants, evaluation of patient's response to treatment, examination of patient, ordering and review of laboratory studies, ordering and review of radiographic studies, ordering and performing treatments and interventions, pulse oximetry, re-evaluation of patient's condition and review of old charts   Medications  diltiazem (CARDIZEM) 125 mg in dextrose 5% 125 mL (1 mg/mL) infusion (7.5 mg/hr Intravenous Rate/Dose Change 01/03/22 0105)  methylPREDNISolone sodium succinate (SOLU-MEDROL) 125 mg/2 mL injection 125 mg (125 mg Intravenous Given 01/03/22 0010)  diltiazem (CARDIZEM) injection 20 mg (20 mg Intravenous Given 01/02/22 2358)  ipratropium-albuterol (DUONEB) 0.5-2.5 (3) MG/3ML nebulizer solution 3 mL (3 mLs Nebulization Given 01/03/22 0013)  lactated ringers bolus 1,000 mL (1,000 mLs Intravenous New Bag/Given 01/03/22 0030)  diltiazem (CARDIZEM) injection 10 mg (10 mg Intravenous Given 01/03/22 0030)     IMPRESSION / MDM / ASSESSMENT AND PLAN / ED COURSE  I reviewed the triage vital signs and the nursing notes.  Differential diagnosis includes, but is not limited to, ACS, PTX, PNA, muscle strain/spasm, PE, dissection  {Patient presents with symptoms of an acute illness or injury that is potentially life-threatening.  29 male presents the ED with symptomatic A-fib RVR requiring diltiazem bolus and drip send medical admission.  Does have stigmata of a COPD distribution alongside this.  CXR is clear.  Normal CBC and metabolic panel.  BNP is slightly elevated, but no gross lab overload.  Marginal elevation of troponin, will trend this.  Doubt ACS.  Consulted medicine for admission.  Clinical Course as of 01/03/22 0141  Fri Jan 03, 2022  0100 Reassessed. [DS]    Clinical Course User Index [DS] Delton Prairie, MD     FINAL  CLINICAL IMPRESSION(S) / ED DIAGNOSES   Final diagnoses:  Atrial fibrillation with RVR (HCC)  COPD exacerbation (HCC)     Rx / DC Orders   ED Discharge Orders     None        Note:  This document was prepared using Dragon voice recognition software and may include unintentional dictation errors.   Delton Prairie, MD 01/03/22 [phone]

## 2022-01-03 DIAGNOSIS — G4733 Obstructive sleep apnea (adult) (pediatric): Secondary | ICD-10-CM | POA: Diagnosis present

## 2022-01-03 DIAGNOSIS — R079 Chest pain, unspecified: Secondary | ICD-10-CM

## 2022-01-03 DIAGNOSIS — Z9861 Coronary angioplasty status: Secondary | ICD-10-CM | POA: Diagnosis not present

## 2022-01-03 DIAGNOSIS — Z1152 Encounter for screening for COVID-19: Secondary | ICD-10-CM | POA: Diagnosis not present

## 2022-01-03 DIAGNOSIS — E669 Obesity, unspecified: Secondary | ICD-10-CM | POA: Diagnosis present

## 2022-01-03 DIAGNOSIS — F17210 Nicotine dependence, cigarettes, uncomplicated: Secondary | ICD-10-CM | POA: Diagnosis present

## 2022-01-03 DIAGNOSIS — E785 Hyperlipidemia, unspecified: Secondary | ICD-10-CM | POA: Diagnosis present

## 2022-01-03 DIAGNOSIS — I4892 Unspecified atrial flutter: Secondary | ICD-10-CM | POA: Diagnosis present

## 2022-01-03 DIAGNOSIS — I255 Ischemic cardiomyopathy: Secondary | ICD-10-CM | POA: Diagnosis present

## 2022-01-03 DIAGNOSIS — R0902 Hypoxemia: Secondary | ICD-10-CM | POA: Diagnosis not present

## 2022-01-03 DIAGNOSIS — R7989 Other specified abnormal findings of blood chemistry: Secondary | ICD-10-CM | POA: Diagnosis not present

## 2022-01-03 DIAGNOSIS — T502X5A Adverse effect of carbonic-anhydrase inhibitors, benzothiadiazides and other diuretics, initial encounter: Secondary | ICD-10-CM | POA: Diagnosis not present

## 2022-01-03 DIAGNOSIS — I340 Nonrheumatic mitral (valve) insufficiency: Secondary | ICD-10-CM | POA: Diagnosis present

## 2022-01-03 DIAGNOSIS — J209 Acute bronchitis, unspecified: Secondary | ICD-10-CM

## 2022-01-03 DIAGNOSIS — I110 Hypertensive heart disease with heart failure: Secondary | ICD-10-CM | POA: Diagnosis present

## 2022-01-03 DIAGNOSIS — I4891 Unspecified atrial fibrillation: Secondary | ICD-10-CM | POA: Diagnosis not present

## 2022-01-03 DIAGNOSIS — Z6833 Body mass index (BMI) 33.0-33.9, adult: Secondary | ICD-10-CM | POA: Diagnosis not present

## 2022-01-03 DIAGNOSIS — R300 Dysuria: Secondary | ICD-10-CM | POA: Diagnosis not present

## 2022-01-03 DIAGNOSIS — R0602 Shortness of breath: Secondary | ICD-10-CM | POA: Diagnosis not present

## 2022-01-03 DIAGNOSIS — F17200 Nicotine dependence, unspecified, uncomplicated: Secondary | ICD-10-CM

## 2022-01-03 DIAGNOSIS — K219 Gastro-esophageal reflux disease without esophagitis: Secondary | ICD-10-CM | POA: Diagnosis present

## 2022-01-03 DIAGNOSIS — I509 Heart failure, unspecified: Secondary | ICD-10-CM | POA: Diagnosis not present

## 2022-01-03 DIAGNOSIS — I5031 Acute diastolic (congestive) heart failure: Secondary | ICD-10-CM | POA: Diagnosis not present

## 2022-01-03 DIAGNOSIS — Z79899 Other long term (current) drug therapy: Secondary | ICD-10-CM | POA: Diagnosis not present

## 2022-01-03 DIAGNOSIS — I2489 Other forms of acute ischemic heart disease: Secondary | ICD-10-CM | POA: Diagnosis present

## 2022-01-03 DIAGNOSIS — I472 Ventricular tachycardia, unspecified: Secondary | ICD-10-CM | POA: Diagnosis not present

## 2022-01-03 DIAGNOSIS — I2510 Atherosclerotic heart disease of native coronary artery without angina pectoris: Secondary | ICD-10-CM | POA: Diagnosis present

## 2022-01-03 DIAGNOSIS — I4819 Other persistent atrial fibrillation: Secondary | ICD-10-CM | POA: Diagnosis present

## 2022-01-03 DIAGNOSIS — I700 Atherosclerosis of aorta: Secondary | ICD-10-CM | POA: Diagnosis present

## 2022-01-03 DIAGNOSIS — L409 Psoriasis, unspecified: Secondary | ICD-10-CM

## 2022-01-03 DIAGNOSIS — J440 Chronic obstructive pulmonary disease with acute lower respiratory infection: Secondary | ICD-10-CM | POA: Diagnosis present

## 2022-01-03 DIAGNOSIS — E876 Hypokalemia: Secondary | ICD-10-CM | POA: Diagnosis not present

## 2022-01-03 DIAGNOSIS — J441 Chronic obstructive pulmonary disease with (acute) exacerbation: Secondary | ICD-10-CM | POA: Diagnosis present

## 2022-01-03 DIAGNOSIS — J9601 Acute respiratory failure with hypoxia: Secondary | ICD-10-CM | POA: Diagnosis not present

## 2022-01-03 LAB — TROPONIN I (HIGH SENSITIVITY)
Troponin I (High Sensitivity): 136 ng/L (ref <18)
Troponin I (High Sensitivity): 21 ng/L — ABNORMAL HIGH (ref <18)
Troponin I (High Sensitivity): 814 ng/L (ref <18)

## 2022-01-03 LAB — BASIC METABOLIC PANEL
Anion gap: 10 (ref 5–15)
BUN: 16 mg/dL (ref 6–20)
CO2: 23 mmol/L (ref 22–32)
Calcium: 9.1 mg/dL (ref 8.9–10.3)
Chloride: 105 mmol/L (ref 98–111)
Creatinine, Ser: 1.13 mg/dL (ref 0.61–1.24)
GFR, Estimated: >60 mL/min (ref >60)
Glucose, Bld: 171 mg/dL — ABNORMAL HIGH (ref 70–99)
Potassium: 3.5 mmol/L (ref 3.5–5.1)
Sodium: 138 mmol/L (ref 135–145)

## 2022-01-03 LAB — BRAIN NATRIURETIC PEPTIDE: B Natriuretic Peptide: 166.6 pg/mL — ABNORMAL HIGH (ref 0.0–100.0)

## 2022-01-03 LAB — HIV ANTIBODY (ROUTINE TESTING W REFLEX): HIV Screen 4th Generation wRfx: NONREACTIVE

## 2022-01-03 MED ORDER — LACTATED RINGERS IV BOLUS
1000.0000 mL | Freq: Once | INTRAVENOUS | Status: AC
Start: 2022-01-03 — End: 2022-01-03
  Administered 2022-01-03: 1000 mL via INTRAVENOUS

## 2022-01-03 MED ORDER — ENOXAPARIN SODIUM 60 MG/0.6ML IJ SOSY
0.5000 mg/kg | PREFILLED_SYRINGE | INTRAMUSCULAR | Status: DC
  Administered 2022-01-03: 57.5 mg via SUBCUTANEOUS
  Filled 2022-01-03: qty 0.6

## 2022-01-03 MED ORDER — ALBUTEROL SULFATE (2.5 MG/3ML) 0.083% IN NEBU: 2.5000 mg | INHALATION_SOLUTION | RESPIRATORY_TRACT | Status: DC | PRN

## 2022-01-03 MED ORDER — DILTIAZEM HCL-DEXTROSE 125-5 MG/125ML-% IV SOLN (PREMIX)
5.0000 mg/h | INTRAVENOUS | Status: DC
  Administered 2022-01-03: 5 mg/h via INTRAVENOUS
  Filled 2022-01-03 (×2): qty 125

## 2022-01-03 MED ORDER — NICOTINE 14 MG/24HR TD PT24
14.0000 mg | MEDICATED_PATCH | Freq: Every day | TRANSDERMAL | Status: DC
  Filled 2022-01-03: qty 1

## 2022-01-03 MED ORDER — APIXABAN 5 MG PO TABS
5.0000 mg | ORAL_TABLET | Freq: Two times a day (BID) | ORAL | Status: DC
  Administered 2022-01-04: 5 mg via ORAL
  Filled 2022-01-03 (×2): qty 1

## 2022-01-03 MED ORDER — DILTIAZEM HCL 25 MG/5ML IV SOLN
10.0000 mg | Freq: Once | INTRAVENOUS | Status: AC
  Administered 2022-01-03: 10 mg via INTRAVENOUS
  Filled 2022-01-03: qty 5

## 2022-01-03 MED ORDER — ONDANSETRON HCL 4 MG/2ML IJ SOLN: 4.0000 mg | Freq: Four times a day (QID) | INTRAMUSCULAR | Status: DC | PRN

## 2022-01-03 MED ORDER — ACETAMINOPHEN 325 MG PO TABS: 650.0000 mg | ORAL_TABLET | ORAL | Status: DC | PRN

## 2022-01-03 MED ORDER — ASPIRIN 81 MG PO TBEC
81.0000 mg | DELAYED_RELEASE_TABLET | Freq: Every day | ORAL | Status: DC
  Administered 2022-01-03 – 2022-01-04 (×2): 81 mg via ORAL
  Filled 2022-01-03 (×2): qty 1

## 2022-01-03 MED ORDER — PREDNISONE 20 MG PO TABS
40.0000 mg | ORAL_TABLET | Freq: Every day | ORAL | Status: DC
  Administered 2022-01-04: 40 mg via ORAL
  Filled 2022-01-03: qty 2

## 2022-01-03 MED ORDER — SODIUM CHLORIDE 0.9 % IV SOLN: INTRAVENOUS | Status: DC | PRN

## 2022-01-03 MED ORDER — DILTIAZEM HCL ER 60 MG PO CP12
120.0000 mg | ORAL_CAPSULE | Freq: Two times a day (BID) | ORAL | Status: DC
  Administered 2022-01-03 – 2022-01-04 (×2): 120 mg via ORAL
  Filled 2022-01-03 (×2): qty 2

## 2022-01-03 NOTE — Assessment & Plan Note (Signed)
-  Nicotine patch 

## 2022-01-03 NOTE — Assessment & Plan Note (Signed)
CAD with history of PCI Ischemic cardiomyopathy Elevated troponin Elevated troponin of 21 and BNP 166 Suspect demand ischemia secondary to rapid A-fib Continue to trend troponin Treat rapid A-fib Echocardiogram to evaluate for wall motion abnormality Continue aspirin, nitroglycerin sublingual as needed with morphine for breakthrough Not currently on beta-blocker or statin

## 2022-01-03 NOTE — ED Notes (Signed)
Floor coverage Randol Kern) secure chatted regarding pts recent troponin result.

## 2022-01-03 NOTE — Assessment & Plan Note (Signed)
Patient was has a cough, wheezing and tachypnea and smoking history Suspect underlying COPD Continue DuoNebs every 6 with as needed albuterol Oral steroids

## 2022-01-03 NOTE — Consult Note (Signed)
CARDIOLOGY CONSULT NOTE               Patient ID: Andres [name] MRN: [id number] DOB/AGE: 57-20-1967 57 y.o.  Admit date: 01/02/2022 Referring Physician Lynn Ito MD Primary Physician Dr. Leim Fabry Primary Cardiologist Mariel Kansky MD Reason for Consultation atrial fibrillation RVR /angina/elevated troponin  HPI: Patient is a 56 year old male with known coronary disease history of PCI and stent 2013 to LAD not on statin therapy history of obesity reportedly complaining of chest discomfort.  Patient had some shortness of breath was found to be tachycardic brought in via rescue and found to have heart rate of over 140 and to be in rapid atrial fibrillation.  Patient was treated with diltiazem for rate control placed on Lovenox symptoms improve troponin increased slightly to 130s patient became asymptomatic cardiology consult was then recommended for further evaluation and management  Review of systems complete and found to be negative unless listed above     No past medical history on file.  Coronary artery disease Previous myocardial infarction PCI and stent Obesity Hyperlipidemia  (Not in a hospital admission)  Social History   Socioeconomic History   Marital status: Married    Spouse name: Not on file   Number of children: Not on file   Years of education: Not on file   Highest education level: Not on file  Occupational History   Not on file  Tobacco Use   Smoking status: Not on file   Smokeless tobacco: Not on file  Substance and Sexual Activity   Alcohol use: Not on file   Drug use: Not on file   Sexual activity: Not on file  Other Topics Concern   Not on file  Social History Narrative   Not on file   Social Determinants of Health   Financial Resource Strain: Not on file  Food Insecurity: Not on file  Transportation Needs: Not on file  Physical Activity: Not on file  Stress: Not on file  Social Connections: Not on file  Intimate Partner  Violence: Not on file    No family history on file.    Review of systems complete and found to be negative unless listed above      PHYSICAL EXAM  General: Well developed, well nourished, in no acute distress HEENT:  Normocephalic and atramatic Neck:  No JVD.  Lungs: Clear bilaterally to auscultation and percussion. Heart: HRRR . Normal S1 and S2 without gallops or murmurs.  Abdomen: Bowel sounds are positive, abdomen soft and non-tender  Msk:  Back normal, normal gait. Normal strength and tone for age. Extremities: No clubbing, cyanosis or edema.   Neuro: Alert and oriented X 3. Psych:  Good affect, responds appropriately  Labs:   Lab Results  Component Value Date   WBC 9.7 01/02/2022   HGB 15.2 01/02/2022   HCT 44.0 01/02/2022   MCV 87.0 01/02/2022   PLT 235 01/02/2022    Recent Labs  Lab 01/02/22 2337  NA 138  K 3.5  CL 105  CO2 23  BUN 16  CREATININE 1.13  CALCIUM 9.1  GLUCOSE 171*   Lab Results  Component Value Date   CKTOTAL 1,148 (H) 10/16/2011   CKMB 37.0 (H) 10/16/2011   TROPONINI < 0.02 07/15/2013    Lab Results  Component Value Date   CHOL 208 (H) 10/16/2011   CHOL 192 06/16/2011   Lab Results  Component Value Date   HDL 29 (L) 10/16/2011   HDL 23 (L)  06/16/2011   Lab Results  Component Value Date   LDLCALC 159 (H) 10/16/2011   LDLCALC 136 (H) 06/16/2011   Lab Results  Component Value Date   TRIG 100 10/16/2011   TRIG 163 06/16/2011   No results found for: "CHOLHDL" No results found for: "LDLDIRECT"    Radiology: [facility] Chest Port 1 View  Result Date: 01/03/2022 CLINICAL DATA:  Chest pain EXAM: PORTABLE CHEST 1 VIEW COMPARISON:  01/24/2020 FINDINGS: Lungs are clear.  No pleural effusion or pneumothorax. The heart is normal in size. IMPRESSION: No evidence of acute cardiopulmonary disease. Electronically Signed   By: Julian Hy M.D.   On: 01/03/2022 00:02    EKG: Rapid atrial fibrillation rate of 130 diffuse nonspecific ST-T  wave changes  ASSESSMENT AND PLAN:  Atrial fibrillation rapid ventricular response Chest pain possible angina Known coronary disease History of PCI and stent Obesity Hyperlipidemia . Plan Rate control for atrial fibrillation with Cardizem Continue anticoagulation transition from Lovenox to Eliquis Aspirin 81 mg once a day for arteriosclerotic vascular disease Lipid therapy unable to tolerate statin would consider injectable Repatha or Praluent Recommend weight loss exercise portion control Consider sleep study for possible obstructive sleep apnea    Signed: Yolonda Kida MD 01/03/2022, 12:44 PM

## 2022-01-03 NOTE — H&P (Signed)
History and Physical    Patient: Andres [name] PXT:[id number] DOB: 1966/01/17 DOA: 01/02/2022 DOS: the patient was seen and examined on 01/03/2022 PCP: [facility]  Patient coming from: Home  Chief Complaint:  Chief Complaint  Patient presents with   Chest Pain    HPI: ALECK LOCKLIN is a 56 y.o. male with medical history significant for CAD with history of stent  ischemic cardiomyopathy, A-fib not on anticoagulation, psoriasis, GERD, insomnia, nicotine dependence, who presents to the ED for evaluation of chest pain and palpitations that started an hour prior to arrival.  Reports in the past several days he has had a nonproductive cough and increased dyspnea on exertion.  He denies lower extremity pain or swelling.  Denies fever or chills. ED course and data review: Heart rate 168 with EKG showing A-fib.  Vitals otherwise unremarkable.  First troponin 21, BNP 166 and labs otherwise unremarkable.  Chest x-ray nonacute. Patient was treated with diltiazem bolus x2 and subsequently placed on a diltiazem infusion with improvement in rate to 110.  He was also found to be wheezing and treated with DuoNebs and Solu-Medrol.  Hospitalist consulted for admission.   Review of Systems: As mentioned in the history of present illness. All other systems reviewed and are negative.   Allergies  Allergen Reactions   Statins     Other reaction(s): Unknown    No family history on file.  Prior to Admission medications   Medication Sig Start Date End Date Taking? Authorizing Provider  nitroGLYCERIN (NITROSTAT) 0.4 MG SL tablet 1 TABLET UNDER TONGUE AT ONSET OF CHEST PAIN. REPEAT IN 5 MIN IF NOT RESOLVED, MAX 3 DOSES. 911 IF NEEDED. 01/20/19  Yes [provider]  traZODone (DESYREL) 100 MG tablet Take 1 tablet by mouth at bedtime. 07/03/21  Yes [provider]  aspirin buffered (TRI-BUFFERED ASPIRIN) 325 MG TABS tablet Take by mouth.    [provider]     Physical Exam: Vitals:   01/03/22 0015 01/03/22 0025 01/03/22 0100 01/03/22 0115  BP:  126/78 106/79 111/85  Pulse: (!) 127 (!) 156 (!) 148 (!) 140  Resp: (!) 25 (!) 24 17 (!) 24  Temp:      SpO2: 95% 95% 90% 93%  Weight:      Height:       Physical Exam Vitals and nursing note reviewed.  Constitutional:      General: He is not in acute distress. HENT:     Head: Normocephalic and atraumatic.  Cardiovascular:     Rate and Rhythm: Tachycardia present. Rhythm irregular.     Heart sounds: Normal heart sounds.  Pulmonary:     Effort: Pulmonary effort is normal. Tachypnea present.     Breath sounds: Normal breath sounds.  Abdominal:     Palpations: Abdomen is soft.     Tenderness: There is no abdominal tenderness.  Neurological:     Mental Status: Mental status is at baseline.     Labs on Admission: I have personally reviewed following labs and imaging studies  CBC: Recent Labs  Lab 01/02/22 2337  WBC 9.7  HGB 15.2  HCT 44.0  MCV 87.0  PLT 235   Basic Metabolic Panel: Recent Labs  Lab 01/02/22 2337  NA 138  K 3.5  CL 105  CO2 23  GLUCOSE 171*  BUN 16  CREATININE 1.13  CALCIUM 9.1   GFR: Estimated Creatinine Clearance: 94.2 mL/min (by C-G formula based on SCr of 1.13 mg/dL). Liver  Function Tests: No results for input(s): "AST", "ALT", "ALKPHOS", "BILITOT", "PROT", "ALBUMIN" in the last 168 hours. No results for input(s): "LIPASE", "AMYLASE" in the last 168 hours. No results for input(s): "AMMONIA" in the last 168 hours. Coagulation Profile: No results for input(s): "INR", "PROTIME" in the last 168 hours. Cardiac Enzymes: No results for input(s): "CKTOTAL", "CKMB", "CKMBINDEX", "TROPONINI" in the last 168 hours. BNP (last 3 results) No results for input(s): "PROBNP" in the last 8760 hours. HbA1C: No results for input(s): "HGBA1C" in the last 72 hours. CBG: No results for input(s): "GLUCAP" in the last 168 hours. Lipid Profile: No results for  input(s): "CHOL", "HDL", "LDLCALC", "TRIG", "CHOLHDL", "LDLDIRECT" in the last 72 hours. Thyroid Function Tests: No results for input(s): "TSH", "T4TOTAL", "FREET4", "T3FREE", "THYROIDAB" in the last 72 hours. Anemia Panel: No results for input(s): "VITAMINB12", "FOLATE", "FERRITIN", "TIBC", "IRON", "RETICCTPCT" in the last 72 hours. Urine analysis:    Component Value Date/Time   COLORURINE Straw 07/15/2013 0505   APPEARANCEUR Clear 07/15/2013 0505   LABSPEC 1.009 07/15/2013 0505   PHURINE 5.0 07/15/2013 0505   GLUCOSEU Negative 07/15/2013 0505   HGBUR 1+ 07/15/2013 0505   BILIRUBINUR Negative 07/15/2013 0505   KETONESUR Negative 07/15/2013 0505   PROTEINUR Negative 07/15/2013 0505   NITRITE Negative 07/15/2013 0505   LEUKOCYTESUR Negative 07/15/2013 0505    Radiological Exams on Admission: DG Chest Port 1 View  Result Date: 01/03/2022 CLINICAL DATA:  Chest pain EXAM: PORTABLE CHEST 1 VIEW COMPARISON:  01/24/2020 FINDINGS: Lungs are clear.  No pleural effusion or pneumothorax. The heart is normal in size. IMPRESSION: No evidence of acute cardiopulmonary disease. Electronically Signed   By: Julian Hy M.D.   On: 01/03/2022 00:02     Data Reviewed: Relevant notes from primary care and specialist visits, past discharge summaries as available in EHR, including Care Everywhere. Prior diagnostic testing as pertinent to current admission diagnoses Updated medications and problem lists for reconciliation ED course, including vitals, labs, imaging, treatment and response to treatment Triage notes, nursing and pharmacy notes and ED provider's notes Notable results as noted in HPI   Assessment and Plan: * Atrial flutter with rapid ventricular response (HCC) Continue diltiazem infusion Echocardiogram Cardiology consult to determine need for anticoagulation  Chest pain CAD with history of PCI Ischemic cardiomyopathy Elevated troponin Elevated troponin of 21 and BNP  166 Suspect demand ischemia secondary to rapid A-fib Continue to trend troponin Treat rapid A-fib Echocardiogram to evaluate for wall motion abnormality Continue aspirin, nitroglycerin sublingual as needed with morphine for breakthrough Not currently on beta-blocker or statin   Acute bronchitis Patient was has a cough, wheezing and tachypnea and smoking history Suspect underlying COPD Continue DuoNebs every 6 with as needed albuterol Oral steroids  Tobacco use disorder Nicotine patch  Psoriasis No acute issues.  Continue home topicals        DVT prophylaxis: Lovenox  Consults: cardiology, [facility]  Advance Care Planning: full code  Family Communication: none  Disposition Plan: Back to previous home environment  Severity of Illness: The appropriate patient status for this patient is INPATIENT. Inpatient status is judged to be reasonable and necessary in order to provide the required intensity of service to ensure the patient's safety. The patient's presenting symptoms, physical exam findings, and initial radiographic and laboratory data in the context of their chronic comorbidities is felt to place them at high risk for further clinical deterioration. Furthermore, it is not anticipated that the patient will be medically stable for discharge from  the hospital within 2 midnights of admission.   * I certify that at the point of admission it is my clinical judgment that the patient will require inpatient hospital care spanning beyond 2 midnights from the point of admission due to high intensity of service, high risk for further deterioration and high frequency of surveillance required.*  Author: Andris Baumann, MD 01/03/2022 2:23 AM  For on call review [email or web address].

## 2022-01-03 NOTE — Consult Note (Signed)
[place] for Apixaban Indication: atrial fibrillation  Patient Measurements: Height: 6' (182.9 cm) Weight: 114.3 kg (252 lb) IBW/kg (Calculated) : 77.6  Labs: Recent Labs    01/02/22 2337 01/03/22 0201  HGB 15.2  --   HCT 44.0  --   PLT 235  --   CREATININE 1.13  --   TROPONINIHS 21* 136*    Estimated Creatinine Clearance: 94.2 mL/min (by C-G formula based on SCr of 1.13 mg/dL).   Medical History: No past medical history on file.  Medications:  No anticoagulation prior to admission per my chart review  Assessment: Patient is a 55 y/o M with medical history including CAD s/p stenting, ischemic cardiomyopathy, Afib not on anticoagulation, psoriasis, GERD, insomnia, nicotine dependence who presented to the ED 10/12 PM with chest pain. Patient subsequently admitted with Aflutter with RVR. Pharmacy consulted for apixaban dosing for Afib.  Plan:  --Apixaban 5 mg BID --CBC at least every 3 days per protocol  Benita Gutter 01/03/2022,4:41 PM

## 2022-01-03 NOTE — Progress Notes (Signed)
This is a no charge noted patient was admitted this AM.  Patient seen and examined H&P reviewed.   Andres [name] is a 56 y.o. male with medical history significant for CAD with history of stent  ischemic cardiomyopathy, A-fib not on anticoagulation, psoriasis, GERD, insomnia, nicotine dependence, who presents to the ED for evaluation of chest pain and palpitations that started an hour prior to arrival.  Reports in the past several days he has had a nonproductive cough and increased dyspnea on exertion.  He denies lower extremity pain or swelling.  Denies fever or chills. ED course and data review: Heart rate 168 with EKG showing A-fib.  Vitals otherwise unremarkable.  First troponin 21, BNP 166 and labs otherwise unremarkable.  Chest x-ray nonacute. Patient was treated with diltiazem bolus x2 and subsequently placed on a diltiazem infusion with improvement in rate to 110.  He was also found to be wheezing and treated with DuoNebs and Solu-Medrol.  Hospitalist consulted for admission  Patient currently chest pain-free.  No shortness of breath at breast  Irreg, tachy Decrease bs, no wheezing Soft benign Mild edema  A/P Continue cardizem gtt Cardiology consulted Ck echo

## 2022-01-03 NOTE — Assessment & Plan Note (Addendum)
Continue diltiazem infusion Echocardiogram Cardiology consult to determine need for anticoagulation

## 2022-01-03 NOTE — Assessment & Plan Note (Signed)
No acute issues.  Continue home topicals

## 2022-01-03 NOTE — Progress Notes (Signed)
Anticoagulation monitoring(Lovenox):  56 yo male ordered Lovenox 40 mg Q24h    Filed Weights   01/02/22 2336  Weight: 114.3 kg (252 lb)   BMI 34.2    Lab Results  Component Value Date   CREATININE 1.13 01/02/2022   CREATININE 1.03 08/06/2021   CREATININE 0.91 03/27/2020   Estimated Creatinine Clearance: 94.2 mL/min (by C-G formula based on SCr of 1.13 mg/dL). Hemoglobin & Hematocrit     Component Value Date/Time   HGB 15.2 01/02/2022 2337   HGB 16.4 07/15/2013 0505   HCT 44.0 01/02/2022 2337   HCT 46.6 07/15/2013 0505     Per Protocol for Patient with estCrcl > 30 ml/min and BMI > 30, will transition to Lovenox 57.5 mg Q24h.

## 2022-01-03 NOTE — Progress Notes (Signed)
PT Cancellation Note  Patient Details Name: Andres [name] MRN: [id number] DOB: [birth year]   Cancelled Treatment:    Reason Eval/Treat Not Completed: PT screened, no needs identified, will sign off. Per OT pt at baseline, ambulatory with no device, no acute PT needs PT to sign off.   Lieutenant Diego PT, DPT 11:59 AM,01/03/22

## 2022-01-03 NOTE — Progress Notes (Signed)
OT Cancellation Note  Patient Details Name: Andres [name] MRN: [id number] DOB: 03/13/1966   Cancelled Treatment:    Reason Eval/Treat Not Completed: OT screened, no needs identified, will sign off. Order received, chart reviewed. Pt back to baseline functional independence. No skilled OT needs identified. Will sign off. Please re-consult if additional needs arise.   Dessie Coma, M.S. OTR/L  01/03/22, 12:20 PM  ascom [phone]

## 2022-01-04 DIAGNOSIS — I4892 Unspecified atrial flutter: Secondary | ICD-10-CM | POA: Diagnosis not present

## 2022-01-04 LAB — TROPONIN I (HIGH SENSITIVITY): Troponin I (High Sensitivity): 445 ng/L (ref <18)

## 2022-01-04 MED ORDER — METOPROLOL TARTRATE 50 MG PO TABS: 50.0000 mg | ORAL_TABLET | Freq: Two times a day (BID) | ORAL | Status: DC

## 2022-01-04 MED ORDER — APIXABAN 5 MG PO TABS: 5.0000 mg | ORAL_TABLET | Freq: Two times a day (BID) | ORAL | 0 refills | Status: DC

## 2022-01-04 MED ORDER — ASPIRIN 81 MG PO TBEC: 81.0000 mg | DELAYED_RELEASE_TABLET | Freq: Every day | ORAL | 12 refills | Status: AC

## 2022-01-04 MED ORDER — AMIODARONE HCL 200 MG PO TABS: 200.0000 mg | ORAL_TABLET | Freq: Two times a day (BID) | ORAL | 0 refills | Status: DC

## 2022-01-04 MED ORDER — DILTIAZEM HCL 30 MG PO TABS: 30.0000 mg | ORAL_TABLET | Freq: Three times a day (TID) | ORAL | 0 refills | Status: DC | PRN

## 2022-01-04 MED ORDER — METOPROLOL TARTRATE 50 MG PO TABS: 50.0000 mg | ORAL_TABLET | Freq: Two times a day (BID) | ORAL | 0 refills | Status: DC

## 2022-01-04 NOTE — Progress Notes (Signed)
Brief Cardiology note Meds for d/c Eliquis 5 mg bid Asa 81 mg Qd Metoprolol tart 50 mg bid Amiodarone 200 mg bid Cardizem 30 mg q8 hr prn heart rate >100 Repatha 145mg  every 2 weeks (cardiology will prescribe in office)  Myoview asoutpt ( cardiology to arrange)  F/u Cardiology next week

## 2022-01-04 NOTE — Discharge Summary (Signed)
Andres [name] LKG:[id number] DOB: 1966-01-03 DOA: 01/02/2022  PCP: [facility]  Admit date: 01/02/2022 Discharge date: 01/04/2022  Admitted From: Home  Disposition: Home  Recommendations for Outpatient Follow-up:  Follow up with PCP in 1 week Please obtain BMP/CBC in one week Please follow up with cardiology in 1 week     Discharge Condition:Stable CODE STATUS: Full Diet recommendation: Heart Healthy  Brief/Interim Summary: Per GUY:QIHK Andres [name] is Andres 56 y.o. male with medical history significant for CAD with history of stent  ischemic cardiomyopathy, Andres-fib not on anticoagulation, psoriasis, GERD, insomnia, nicotine dependence, who presents to the ED for evaluation of chest pain and palpitations that started an hour prior to arrival.  Reports in the past several days he has had Andres nonproductive cough and increased dyspnea on exertion.  He denies lower extremity pain or swelling.  Denies fever or chills. ED course and data review: Heart rate 168 with EKG showing Andres-fib.  Vitals otherwise unremarkable.  First troponin 21, BNP 166 and labs otherwise unremarkable.  Chest x-ray nonacute. Patient was treated with diltiazem bolus x2 and subsequently placed on Andres diltiazem infusion with improvement in rate to 110.  He was also found to be wheezing and treated with DuoNebs and Solu-Medrol.  Hospitalist consulted for admission.  Was started on Cardizem drip .  Cardiology was consulted.  They recommended Eliquis 5 mg twice daily, aspirin, metoprolol and amiodarone.  He remained stable and cleared for discharge.   Atrial flutter with rapid ventricular response (HCC) Cardizem drip was started transition to p.o. Neurology was consulted Plan for echo as outpatient Per cardiology Meds for d/c Eliquis 5 mg bid Asa 81 mg Qd Metoprolol tart 50 mg bid Amiodarone 200 mg bid Cardizem 30 mg q8 hr prn heart rate >100 Repatha 145mg  every 2 weeks (cardiology will prescribe in office)        Chest pain CAD with history of PCI Ischemic cardiomyopathy Elevated troponin Suspect demand ischemia secondary to rapid Andres-fib Myoview asoutpt ( cardiology to arrange) Cardiology will obtain echo as outpatient     Acute bronchitis Improved to follow-up with PCP as outpatient   Tobacco use disorder Was counseled to stop   Psoriasis No acute issues.  Continue home topicals                Discharge Diagnoses:  Principal Problem:   Atrial flutter with rapid ventricular response (HCC) Active Problems:   Chest pain   CAD S/P percutaneous coronary angioplasty   Ischemic cardiomyopathy   Psoriasis   Tobacco use disorder   Acute bronchitis    Discharge Instructions  Discharge Instructions     Amb referral to AFIB Clinic   Complete by: As directed    Diet - low sodium heart healthy   Complete by: As directed    Discharge instructions   Complete by: As directed    NO NSAIDS If HR > 100, can take cardizem as needed F/u with cardiology Dr. Clayborn Bigness next week F/u with you pcp in one week   Increase activity slowly   Complete by: As directed       Allergies as of 01/04/2022       Reactions   Statins    Other reaction(s): Unknown        Medication List     STOP taking these medications    Tri-Buffered Aspirin 325 MG Tabs tablet Generic drug: aspirin buffered       TAKE these medications  amiodarone 200 MG tablet Commonly known as: Pacerone Take 1 tablet (200 mg total) by mouth 2 (two) times daily.   apixaban 5 MG Tabs tablet Commonly known as: ELIQUIS Take 1 tablet (5 mg total) by mouth 2 (two) times daily.   aspirin EC 81 MG tablet Take 1 tablet (81 mg total) by mouth daily. Swallow whole. Start taking on: January 05, 2022   Cyanocobalamin 1000 MCG Tbcr Take 1 tablet by mouth daily.   diltiazem 30 MG tablet Commonly known as: Cardizem Take 1 tablet (30 mg total) by mouth 3 (three) times daily as needed for up to 14 days (If HR >  100).   FLUoxetine 10 MG capsule Commonly known as: PROZAC Take 10 mg by mouth daily.   HYDROcodone-acetaminophen 5-325 MG tablet Commonly known as: NORCO/VICODIN Take one tablet at night for pain; may take up to every 6 hours as needed for pain if not working or driving   metoprolol tartrate 50 MG tablet Commonly known as: LOPRESSOR Take 1 tablet (50 mg total) by mouth 2 (two) times daily.   nitroGLYCERIN 0.4 MG SL tablet Commonly known as: NITROSTAT 1 TABLET UNDER TONGUE AT ONSET OF CHEST PAIN. REPEAT IN 5 MIN IF NOT RESOLVED, MAX 3 DOSES. 911 IF NEEDED.   traZODone 100 MG tablet Commonly known as: DESYREL Take 1 tablet by mouth at bedtime.        Follow-up Information     [name], Andres D, MD Follow up in 1 week(s).   Specialties: Cardiology, Internal Medicine Contact information: [address] [place] [address] [phone]         [name] Follow up in 1 week(s).   Contact information: [address] [place] [address] [phone]                Allergies  Allergen Reactions   Statins     Other reaction(s): Unknown    Consultations: Cardiology   Procedures/Studies: DG Chest Port 1 View  Result Date: 01/03/2022 CLINICAL DATA:  Chest pain EXAM: PORTABLE CHEST 1 VIEW COMPARISON:  01/24/2020 FINDINGS: Lungs are clear.  No pleural effusion or pneumothorax. The heart is normal in size. IMPRESSION: No evidence of acute cardiopulmonary disease. Electronically Signed   By: Charline Bills M.D.   On: 01/03/2022 00:02      Subjective: No shortness of breath, cough or chest pain  Discharge Exam: Vitals:   01/04/22 1025 01/04/22 1356  BP:  (!) 126/93  Pulse:  78  Resp:  18  Temp: 98.4 F (36.9 C)   SpO2:  96%   Vitals:   01/04/22 0730 01/04/22 0930 01/04/22 1025 01/04/22 1356  BP:  (!) 148/76  (!) 126/93  Pulse: (!) 151 (!) 102  78  Resp: 19 18  18   Temp:   98.4 F (36.9 C)   TempSrc:   Oral   SpO2:  (!) 86% 93%  96%  Weight:      Height:        General: Pt is alert, awake, not in acute distress Cardiovascular: RRR, S1/S2 +, no rubs, no gallops Respiratory: CTA bilaterally, no wheezing, no rhonchi Abdominal: Soft, NT, ND, bowel sounds + Extremities: no edema, no cyanosis    The results of significant diagnostics from this hospitalization (including imaging, microbiology, ancillary and laboratory) are listed below for reference.     Microbiology: Recent Results (from the past 240 hour(s))  Blood culture (single)     Status: None (Preliminary  result)   Collection Time: 01/02/22 12:05 AM   Specimen: BLOOD LEFT HAND  Result Value Ref Range Status   Specimen Description BLOOD LEFT HAND  Final   Special Requests   Final    BOTTLES DRAWN AEROBIC AND ANAEROBIC Blood Culture adequate volume   Culture   Final    NO GROWTH 1 DAY Performed at [facility], [address], [place], [place] [address]    Report Status PENDING  Incomplete     Labs: BNP (last 3 results) Recent Labs    01/03/22 0005  BNP 166.6*   Basic Metabolic Panel: Recent Labs  Lab 01/02/22 2337  NA 138  K 3.5  CL 105  CO2 23  GLUCOSE 171*  BUN 16  CREATININE 1.13  CALCIUM 9.1   Liver Function Tests: No results for input(s): "AST", "ALT", "ALKPHOS", "BILITOT", "PROT", "ALBUMIN" in the last 168 hours. No results for input(s): "LIPASE", "AMYLASE" in the last 168 hours. No results for input(s): "AMMONIA" in the last 168 hours. CBC: Recent Labs  Lab 01/02/22 2337  WBC 9.7  HGB 15.2  HCT 44.0  MCV 87.0  PLT 235   Cardiac Enzymes: No results for input(s): "CKTOTAL", "CKMB", "CKMBINDEX", "TROPONINI" in the last 168 hours. BNP: Invalid input(s): "POCBNP" CBG: No results for input(s): "GLUCAP" in the last 168 hours. D-Dimer No results for input(s): "DDIMER" in the last 72 hours. Hgb A1c No results for input(s): "HGBA1C" in the last 72 hours. Lipid Profile No results for input(s):  "CHOL", "HDL", "LDLCALC", "TRIG", "CHOLHDL", "LDLDIRECT" in the last 72 hours. Thyroid function studies No results for input(s): "TSH", "T4TOTAL", "T3FREE", "THYROIDAB" in the last 72 hours.  Invalid input(s): "FREET3" Anemia work up No results for input(s): "VITAMINB12", "FOLATE", "FERRITIN", "TIBC", "IRON", "RETICCTPCT" in the last 72 hours. Urinalysis    Component Value Date/Time   COLORURINE Straw 07/15/2013 0505   APPEARANCEUR Clear 07/15/2013 0505   LABSPEC 1.009 07/15/2013 0505   PHURINE 5.0 07/15/2013 0505   GLUCOSEU Negative 07/15/2013 0505   HGBUR 1+ 07/15/2013 0505   BILIRUBINUR Negative 07/15/2013 0505   KETONESUR Negative 07/15/2013 0505   PROTEINUR Negative 07/15/2013 0505   NITRITE Negative 07/15/2013 0505   LEUKOCYTESUR Negative 07/15/2013 0505   Sepsis Labs Recent Labs  Lab 01/02/22 2337  WBC 9.7   Microbiology Recent Results (from the past 240 hour(s))  Blood culture (single)     Status: None (Preliminary result)   Collection Time: 01/02/22 12:05 AM   Specimen: BLOOD LEFT HAND  Result Value Ref Range Status   Specimen Description BLOOD LEFT HAND  Final   Special Requests   Final    BOTTLES DRAWN AEROBIC AND ANAEROBIC Blood Culture adequate volume   Culture   Final    NO GROWTH 1 DAY Performed at [facility], [address], [place], [place] [address]    Report Status PENDING  Incomplete     Time coordinating discharge: Over 30 minutes  SIGNED:   Lynn Ito, MD  [facility] 01/04/2022, 5:29 PM Pager   If 7PM-7AM, please contact night-coverage [email or web address] Password [email or web address]

## 2022-01-04 NOTE — ED Notes (Signed)
Pt given decaff coffee

## 2022-01-04 NOTE — ED Notes (Signed)
Pt not in room, found walking around ER. Pt denies any CP/SOB. Connected back to heart monitor.

## 2022-01-04 NOTE — Progress Notes (Signed)
[facility]    SUBJECTIVE: Patient feeling much better feels like it ready to go home no symptoms of palpitation tachycardia no chest pain no shortness of breath   Vitals:   01/04/22 0730 01/04/22 0930 01/04/22 1025 01/04/22 1356  BP:  (!) 148/76  (!) 126/93  Pulse: (!) 151 (!) 102  78  Resp: 19 18  18   Temp:   98.4 F (36.9 C)   TempSrc:   Oral   SpO2: (!) 86% 93%  96%  Weight:      Height:        No intake or output data in the 24 hours ending 01/04/22 1839    PHYSICAL EXAM  General: Well developed, well nourished, in no acute distress HEENT:  Normocephalic and atramatic Neck:  No JVD.  Lungs: Clear bilaterally to auscultation and percussion. Heart: Irregular irregular. Normal S1 and S2 without gallops or murmurs.  Abdomen: Bowel sounds are positive, abdomen soft and non-tender  Msk:  Back normal, normal gait. Normal strength and tone for age. Extremities: No clubbing, cyanosis or edema.   Neuro: Alert and oriented X 3. Psych:  Good affect, responds appropriately   LABS: Basic Metabolic Panel: Recent Labs    01/02/22 2337  NA 138  K 3.5  CL 105  CO2 23  GLUCOSE 171*  BUN 16  CREATININE 1.13  CALCIUM 9.1   Liver Function Tests: No results for input(s): "AST", "ALT", "ALKPHOS", "BILITOT", "PROT", "ALBUMIN" in the last 72 hours. No results for input(s): "LIPASE", "AMYLASE" in the last 72 hours. CBC: Recent Labs    01/02/22 2337  WBC 9.7  HGB 15.2  HCT 44.0  MCV 87.0  PLT 235   Cardiac Enzymes: No results for input(s): "CKTOTAL", "CKMB", "CKMBINDEX", "TROPONINI" in the last 72 hours. BNP: Invalid input(s): "POCBNP" D-Dimer: No results for input(s): "DDIMER" in the last 72 hours. Hemoglobin A1C: No results for input(s): "HGBA1C" in the last 72 hours. Fasting Lipid Panel: No results for input(s): "CHOL", "HDL", "LDLCALC", "TRIG", "CHOLHDL", "LDLDIRECT" in the last 72 hours. Thyroid Function Tests: No results for input(s): "TSH", "T4TOTAL",  "T3FREE", "THYROIDAB" in the last 72 hours.  Invalid input(s): "FREET3" Anemia Panel: No results for input(s): "VITAMINB12", "FOLATE", "FERRITIN", "TIBC", "IRON", "RETICCTPCT" in the last 72 hours.  DG Chest Port 1 View  Result Date: 01/03/2022 CLINICAL DATA:  Chest pain EXAM: PORTABLE CHEST 1 VIEW COMPARISON:  01/24/2020 FINDINGS: Lungs are clear.  No pleural effusion or pneumothorax. The heart is normal in size. IMPRESSION: No evidence of acute cardiopulmonary disease. Electronically Signed   By: Julian Hy M.D.   On: 01/03/2022 00:02     Echo pending  TELEMETRY: Atrial fibrillation rate controlled at around 95:  ASSESSMENT AND PLAN:  Principal Problem:   Atrial flutter with rapid ventricular response (HCC) Active Problems:   CAD S/P percutaneous coronary angioplasty   Ischemic cardiomyopathy   Psoriasis   Tobacco use disorder   Chest pain   Acute bronchitis Elevated troponins probably demand ischemia  Plan Rapid atrial fibrillation rate control continue metoprolol add Cardizem as needed continue Eliquis anticoagulation Add amiodarone for rhythm management while on Eliquis and metoprolol consider cardioversion in 3 to 4 weeks if still in A-fib Echocardiogram as an outpatient to evaluate valvular structure Consider statin therapy or Repatha for hyperlipidemia Advised patient refrain from tobacco abuse Recommend early discharge on beta-blocker aspirin amiodarone Cardizem as needed Follow-up with cardiology this week    Yolonda Kida, MD 01/04/2022 6:39 PM

## 2022-01-05 ENCOUNTER — Emergency Department: Payer: BC Managed Care – PPO

## 2022-01-05 ENCOUNTER — Other Ambulatory Visit: Payer: Self-pay

## 2022-01-05 ENCOUNTER — Inpatient Hospital Stay (HOSPITAL_COMMUNITY)
Admission: EM | Admit: 2022-01-05 | Discharge: 2022-01-08 | Disposition: A | Discharge status: Home / Self Care | Payer: BC Managed Care – PPO | Source: Home / Self Care | Attending: Internal Medicine | Admitting: Internal Medicine

## 2022-01-05 ENCOUNTER — Encounter: Payer: Self-pay | Admitting: Emergency Medicine

## 2022-01-05 DIAGNOSIS — Z888 Allergy status to other drugs, medicaments and biological substances status: Secondary | ICD-10-CM

## 2022-01-05 DIAGNOSIS — R079 Chest pain, unspecified: Secondary | ICD-10-CM

## 2022-01-05 DIAGNOSIS — Z9861 Coronary angioplasty status: Secondary | ICD-10-CM | POA: Diagnosis not present

## 2022-01-05 DIAGNOSIS — L409 Psoriasis, unspecified: Secondary | ICD-10-CM | POA: Diagnosis present

## 2022-01-05 DIAGNOSIS — I4819 Other persistent atrial fibrillation: Secondary | ICD-10-CM | POA: Diagnosis present

## 2022-01-05 DIAGNOSIS — E785 Hyperlipidemia, unspecified: Secondary | ICD-10-CM | POA: Diagnosis present

## 2022-01-05 DIAGNOSIS — R0902 Hypoxemia: Secondary | ICD-10-CM

## 2022-01-05 DIAGNOSIS — J9601 Acute respiratory failure with hypoxia: Secondary | ICD-10-CM | POA: Diagnosis present

## 2022-01-05 DIAGNOSIS — Z79899 Other long term (current) drug therapy: Secondary | ICD-10-CM

## 2022-01-05 DIAGNOSIS — I252 Old myocardial infarction: Secondary | ICD-10-CM

## 2022-01-05 DIAGNOSIS — I255 Ischemic cardiomyopathy: Secondary | ICD-10-CM | POA: Diagnosis present

## 2022-01-05 DIAGNOSIS — D72829 Elevated white blood cell count, unspecified: Secondary | ICD-10-CM | POA: Diagnosis present

## 2022-01-05 DIAGNOSIS — F17200 Nicotine dependence, unspecified, uncomplicated: Secondary | ICD-10-CM | POA: Diagnosis present

## 2022-01-05 DIAGNOSIS — I110 Hypertensive heart disease with heart failure: Secondary | ICD-10-CM | POA: Diagnosis present

## 2022-01-05 DIAGNOSIS — I2510 Atherosclerotic heart disease of native coronary artery without angina pectoris: Secondary | ICD-10-CM | POA: Diagnosis present

## 2022-01-05 DIAGNOSIS — F17210 Nicotine dependence, cigarettes, uncomplicated: Secondary | ICD-10-CM | POA: Diagnosis present

## 2022-01-05 DIAGNOSIS — I5033 Acute on chronic diastolic (congestive) heart failure: Secondary | ICD-10-CM | POA: Diagnosis present

## 2022-01-05 DIAGNOSIS — G4700 Insomnia, unspecified: Secondary | ICD-10-CM | POA: Diagnosis present

## 2022-01-05 DIAGNOSIS — R0602 Shortness of breath: Principal | ICD-10-CM

## 2022-01-05 DIAGNOSIS — I509 Heart failure, unspecified: Secondary | ICD-10-CM

## 2022-01-05 DIAGNOSIS — K219 Gastro-esophageal reflux disease without esophagitis: Secondary | ICD-10-CM | POA: Diagnosis present

## 2022-01-05 DIAGNOSIS — R7989 Other specified abnormal findings of blood chemistry: Secondary | ICD-10-CM | POA: Diagnosis present

## 2022-01-05 DIAGNOSIS — Z7982 Long term (current) use of aspirin: Secondary | ICD-10-CM

## 2022-01-05 DIAGNOSIS — Z955 Presence of coronary angioplasty implant and graft: Secondary | ICD-10-CM

## 2022-01-05 DIAGNOSIS — Z6833 Body mass index (BMI) 33.0-33.9, adult: Secondary | ICD-10-CM

## 2022-01-05 DIAGNOSIS — Z1152 Encounter for screening for COVID-19: Secondary | ICD-10-CM

## 2022-01-05 DIAGNOSIS — R300 Dysuria: Secondary | ICD-10-CM

## 2022-01-05 DIAGNOSIS — Z7901 Long term (current) use of anticoagulants: Secondary | ICD-10-CM

## 2022-01-05 DIAGNOSIS — I472 Ventricular tachycardia, unspecified: Secondary | ICD-10-CM | POA: Diagnosis present

## 2022-01-05 DIAGNOSIS — I700 Atherosclerosis of aorta: Secondary | ICD-10-CM | POA: Diagnosis present

## 2022-01-05 DIAGNOSIS — E669 Obesity, unspecified: Secondary | ICD-10-CM | POA: Diagnosis present

## 2022-01-05 HISTORY — DX: Personal history of urinary calculi: Z87.442

## 2022-01-05 HISTORY — DX: Atherosclerotic heart disease of native coronary artery without angina pectoris: I25.10

## 2022-01-05 HISTORY — DX: Unspecified atrial fibrillation: I48.91

## 2022-01-05 HISTORY — DX: Heart failure, unspecified: I50.9

## 2022-01-05 LAB — CBC
HCT: 45.3 % (ref 39.0–52.0)
Hemoglobin: 15.9 g/dL (ref 13.0–17.0)
MCH: 30.2 pg (ref 26.0–34.0)
MCHC: 35.1 g/dL (ref 30.0–36.0)
MCV: 86 fL (ref 80.0–100.0)
Platelets: 297 10*3/uL (ref 150–400)
RBC: 5.27 MIL/uL (ref 4.22–5.81)
RDW: 13.2 % (ref 11.5–15.5)
WBC: 18.7 10*3/uL — ABNORMAL HIGH (ref 4.0–10.5)
nRBC: 0 % (ref 0.0–0.2)

## 2022-01-05 LAB — BRAIN NATRIURETIC PEPTIDE: B Natriuretic Peptide: 858.3 pg/mL — ABNORMAL HIGH (ref 0.0–100.0)

## 2022-01-05 LAB — RESP PANEL BY RT-PCR (FLU A&B, COVID) ARPGX2
Influenza A by PCR: NEGATIVE
Influenza B by PCR: NEGATIVE
SARS Coronavirus 2 by RT PCR: NEGATIVE

## 2022-01-05 LAB — BASIC METABOLIC PANEL
Anion gap: 8 (ref 5–15)
Anion gap: 9 (ref 5–15)
BUN: 16 mg/dL (ref 6–20)
BUN: 20 mg/dL (ref 6–20)
CO2: 21 mmol/L — ABNORMAL LOW (ref 22–32)
CO2: 27 mmol/L (ref 22–32)
Calcium: 8.7 mg/dL — ABNORMAL LOW (ref 8.9–10.3)
Calcium: 9.1 mg/dL (ref 8.9–10.3)
Chloride: 109 mmol/L (ref 98–111)
Chloride: 102 mmol/L (ref 98–111)
Creatinine, Ser: 0.99 mg/dL (ref 0.61–1.24)
Creatinine, Ser: 1.14 mg/dL (ref 0.61–1.24)
GFR, Estimated: >60 mL/min (ref >60)
GFR, Estimated: >60 mL/min (ref >60)
Glucose, Bld: 101 mg/dL — ABNORMAL HIGH (ref 70–99)
Glucose, Bld: 104 mg/dL — ABNORMAL HIGH (ref 70–99)
Potassium: 3.7 mmol/L (ref 3.5–5.1)
Potassium: 3.9 mmol/L (ref 3.5–5.1)
Sodium: 138 mmol/L (ref 135–145)
Sodium: 138 mmol/L (ref 135–145)

## 2022-01-05 LAB — TSH: TSH: 8.919 u[IU]/mL — ABNORMAL HIGH (ref 0.350–4.500)

## 2022-01-05 LAB — MAGNESIUM: Magnesium: 2.2 mg/dL (ref 1.7–2.4)

## 2022-01-05 LAB — TROPONIN I (HIGH SENSITIVITY)
Troponin I (High Sensitivity): 228 ng/L (ref <18)
Troponin I (High Sensitivity): 297 ng/L (ref <18)

## 2022-01-05 LAB — T4, FREE: Free T4: 0.74 ng/dL (ref 0.61–1.12)

## 2022-01-05 MED ORDER — FLUOXETINE HCL 10 MG PO CAPS
10.0000 mg | ORAL_CAPSULE | Freq: Every day | ORAL | Status: DC
  Administered 2022-01-05 – 2022-01-07 (×2): 10 mg via ORAL
  Filled 2022-01-05 (×4): qty 1

## 2022-01-05 MED ORDER — FUROSEMIDE 10 MG/ML IJ SOLN
INTRAMUSCULAR | Status: AC
  Filled 2022-01-05: qty 4

## 2022-01-05 MED ORDER — SODIUM CHLORIDE 0.9% FLUSH
3.0000 mL | Freq: Two times a day (BID) | INTRAVENOUS | Status: DC
  Administered 2022-01-05 – 2022-01-07 (×3): 3 mL via INTRAVENOUS

## 2022-01-05 MED ORDER — ONDANSETRON HCL 4 MG/2ML IJ SOLN: 4.0000 mg | Freq: Three times a day (TID) | INTRAMUSCULAR | Status: AC | PRN

## 2022-01-05 MED ORDER — AMIODARONE LOAD VIA INFUSION
150.0000 mg | Freq: Once | INTRAVENOUS | Status: AC
  Administered 2022-01-05: 150 mg via INTRAVENOUS
  Filled 2022-01-05: qty 83.34

## 2022-01-05 MED ORDER — FUROSEMIDE 10 MG/ML IJ SOLN
40.0000 mg | Freq: Once | INTRAMUSCULAR | Status: AC
  Administered 2022-01-05: 40 mg via INTRAVENOUS
  Filled 2022-01-05: qty 4

## 2022-01-05 MED ORDER — ASPIRIN 81 MG PO TBEC
81.0000 mg | DELAYED_RELEASE_TABLET | Freq: Every day | ORAL | Status: DC
  Administered 2022-01-05 – 2022-01-08 (×3): 81 mg via ORAL
  Filled 2022-01-05 (×3): qty 1

## 2022-01-05 MED ORDER — SODIUM CHLORIDE 0.9% FLUSH: 3.0000 mL | INTRAVENOUS | Status: DC | PRN

## 2022-01-05 MED ORDER — LEVALBUTEROL HCL 0.63 MG/3ML IN NEBU
0.6300 mg | INHALATION_SOLUTION | Freq: Four times a day (QID) | RESPIRATORY_TRACT | Status: AC | PRN
  Administered 2022-01-05: 0.63 mg via RESPIRATORY_TRACT
  Filled 2022-01-05: qty 3

## 2022-01-05 MED ORDER — METOPROLOL TARTRATE 50 MG PO TABS
50.0000 mg | ORAL_TABLET | Freq: Two times a day (BID) | ORAL | Status: DC
  Administered 2022-01-05 – 2022-01-06 (×3): 50 mg via ORAL
  Filled 2022-01-05 (×3): qty 1

## 2022-01-05 MED ORDER — NITROGLYCERIN 0.4 MG SL SUBL: 0.4000 mg | SUBLINGUAL_TABLET | SUBLINGUAL | Status: DC | PRN

## 2022-01-05 MED ORDER — SODIUM CHLORIDE 0.9 % IV SOLN: 250.0000 mL | INTRAVENOUS | Status: DC | PRN

## 2022-01-05 MED ORDER — TRAZODONE HCL 100 MG PO TABS
100.0000 mg | ORAL_TABLET | Freq: Every day | ORAL | Status: DC
  Administered 2022-01-05 – 2022-01-07 (×3): 100 mg via ORAL
  Filled 2022-01-05 (×3): qty 1

## 2022-01-05 MED ORDER — AMIODARONE HCL IN DEXTROSE 360-4.14 MG/200ML-% IV SOLN
60.0000 mg/h | INTRAVENOUS | Status: DC
  Administered 2022-01-05 (×2): 60 mg/h via INTRAVENOUS
  Filled 2022-01-05 (×2): qty 200

## 2022-01-05 MED ORDER — AMIODARONE HCL IN DEXTROSE 360-4.14 MG/200ML-% IV SOLN
30.0000 mg/h | INTRAVENOUS | Status: DC
  Administered 2022-01-06: 30 mg/h via INTRAVENOUS
  Filled 2022-01-05: qty 200

## 2022-01-05 MED ORDER — ACETAMINOPHEN 650 MG RE SUPP: 650.0000 mg | Freq: Four times a day (QID) | RECTAL | Status: DC | PRN

## 2022-01-05 MED ORDER — HYDROCODONE-ACETAMINOPHEN 5-325 MG PO TABS: 1.0000 | ORAL_TABLET | Freq: Four times a day (QID) | ORAL | Status: DC | PRN

## 2022-01-05 MED ORDER — ALBUTEROL SULFATE (2.5 MG/3ML) 0.083% IN NEBU: 2.5000 mg | INHALATION_SOLUTION | RESPIRATORY_TRACT | Status: AC | PRN

## 2022-01-05 MED ORDER — SODIUM CHLORIDE 0.9% FLUSH
3.0000 mL | Freq: Two times a day (BID) | INTRAVENOUS | Status: DC
  Administered 2022-01-05 – 2022-01-07 (×3): 3 mL via INTRAVENOUS

## 2022-01-05 MED ORDER — ACETAMINOPHEN 325 MG PO TABS: 650.0000 mg | ORAL_TABLET | Freq: Four times a day (QID) | ORAL | Status: DC | PRN

## 2022-01-05 MED ORDER — APIXABAN 5 MG PO TABS
5.0000 mg | ORAL_TABLET | Freq: Two times a day (BID) | ORAL | Status: DC
  Administered 2022-01-05: 5 mg via ORAL
  Filled 2022-01-05: qty 1

## 2022-01-05 MED ORDER — VITAMIN B-12 1000 MCG PO TABS
1000.0000 ug | ORAL_TABLET | Freq: Every day | ORAL | Status: DC
  Administered 2022-01-05 – 2022-01-08 (×3): 1000 ug via ORAL
  Filled 2022-01-05 (×4): qty 1

## 2022-01-05 MED ORDER — AMIODARONE HCL 200 MG PO TABS
200.0000 mg | ORAL_TABLET | Freq: Two times a day (BID) | ORAL | Status: DC
  Administered 2022-01-05: 200 mg via ORAL
  Filled 2022-01-05: qty 1

## 2022-01-05 MED ORDER — IOHEXOL 350 MG/ML SOLN
75.0000 mL | Freq: Once | INTRAVENOUS | Status: AC | PRN
  Administered 2022-01-05: 75 mL via INTRAVENOUS

## 2022-01-05 MED ORDER — FUROSEMIDE 10 MG/ML IJ SOLN: 40.0000 mg | Freq: Every day | INTRAMUSCULAR | Status: DC

## 2022-01-05 MED ORDER — HYDROMORPHONE HCL 1 MG/ML IJ SOLN: 0.5000 mg | INTRAMUSCULAR | Status: DC | PRN

## 2022-01-05 MED ORDER — FUROSEMIDE 10 MG/ML IJ SOLN
40.0000 mg | Freq: Once | INTRAMUSCULAR | Status: AC
  Administered 2022-01-05: 40 mg via INTRAVENOUS

## 2022-01-05 NOTE — Assessment & Plan Note (Addendum)
History of coronary artery disease status post stent angioplasty CT angiogram of the chest shows aortic atherosclerosis, in addition to left main and three-vessel coronary artery disease. Continue aspirin and metoprolol Consult cardiology

## 2022-01-05 NOTE — Assessment & Plan Note (Signed)
In a patient with known coronary artery disease status post stent angioplasty We will cycle cardiac enzymes Continue aspirin and metoprolol Follow-up results of 2D echocardiogram to assess LVEF and rule out regional wall motion abnormality

## 2022-01-05 NOTE — Consult Note (Signed)
CARDIOLOGY CONSULT NOTE               Patient ID: Andres [name] MRN: [id number] DOB/AGE: 1965/09/23 56 y.o.  Admit date: 01/05/2022 Referring Physician Dr. Joylene Igo hospitalist Primary Physician [facility] primary care management Dr. Theodis Aguas Primary Cardiologist Dr. Mariel Kansky Reason for Consultation acute coronary syndrome atrial fibrillation congestive heart failure  HPI: Patient is a 56 year old obese male history of known coronary disease PCI and stent to proximal LAD 2013 recent atrial fibrillation placed on Eliquis GERD insomnia continued smoking obstructive sleep apnea probably obesity hyperlipidemia states did not appear to tolerate statins was recently seen here in the emergency room with rapid atrial fibrillation rate was controlled patient felt well wanted to be discharged home with early follow-up once patient went home on beta-blockers Eliquis low-dose diuretic she had another episode this time of no significant chest pain but significant dyspnea and shortness of breath and appeared to be in heart failure with sats in the 80s so he was readmitted treated with IV diuretic therapy and had significant improvement in his symptoms patient had elevated troponins which remained relatively flat with a peak of 800 during these episodes of rapid atrial fibrillation he has had some chest pain initially but none now mostly dyspnea shortness of breath.  Repeat troponin is over 1000 after trending down to 800 down to around 200.  Review of systems complete and found to be negative unless listed above     Past Medical History:  Diagnosis Date   A-fib [facility])    Coronary artery disease     History reviewed. No pertinent surgical history.  (Not in a hospital admission)  Social History   Socioeconomic History   Marital status: Married    Spouse name: Not on file   Number of children: Not on file   Years of education: Not on file   Highest education level: Not on file  Occupational History    Not on file  Tobacco Use   Smoking status: Every Day    Packs/day: 1.00    Types: Cigarettes   Smokeless tobacco: Not on file  Substance and Sexual Activity   Alcohol use: Not on file   Drug use: Not on file   Sexual activity: Not on file  Other Topics Concern   Not on file  Social History Narrative   Not on file   Social Determinants of Health   Financial Resource Strain: Not on file  Food Insecurity: Not on file  Transportation Needs: Not on file  Physical Activity: Not on file  Stress: Not on file  Social Connections: Not on file  Intimate Partner Violence: Not on file    History reviewed. No pertinent family history.    Review of systems complete and found to be negative unless listed above      PHYSICAL EXAM  General: Well developed, well nourished, in no acute distress HEENT:  Normocephalic and atramatic Neck:  No JVD.  Lungs: Clear bilaterally to auscultation and percussion. Heart: Irregularly irregular. Normal S1 and S2 without gallops or murmurs.  Abdomen: Bowel sounds are positive, abdomen soft and non-tender  Msk:  Back normal, normal gait. Normal strength and tone for age. Extremities: No clubbing, cyanosis or edema.   Neuro: Alert and oriented X 3. Psych:  Good affect, responds appropriately  Labs:   Lab Results  Component Value Date   WBC 18.7 (H) 01/05/2022   HGB 15.9 01/05/2022   HCT 45.3 01/05/2022   MCV 86.0 01/05/2022  PLT 297 01/05/2022    Recent Labs  Lab 01/05/22 0400  NA 138  K 3.7  CL 109  CO2 21*  BUN 16  CREATININE 0.99  CALCIUM 8.7*  GLUCOSE 101*   Lab Results  Component Value Date   CKTOTAL 1,148 (H) 10/16/2011   CKMB 37.0 (H) 10/16/2011   TROPONINI < 0.02 07/15/2013    Lab Results  Component Value Date   CHOL 208 (H) 10/16/2011   CHOL 192 06/16/2011   Lab Results  Component Value Date   HDL 29 (L) 10/16/2011   HDL 23 (L) 06/16/2011   Lab Results  Component Value Date   LDLCALC 159 (H) 10/16/2011    LDLCALC 136 (H) 06/16/2011   Lab Results  Component Value Date   TRIG 100 10/16/2011   TRIG 163 06/16/2011   No results found for: "CHOLHDL" No results found for: "LDLDIRECT"    Radiology: CT Angio Chest PE W/Cm &/Or Wo Cm  Result Date: 01/05/2022 CLINICAL DATA:  56 year old male with suspected pulmonary embolism. EXAM: CT ANGIOGRAPHY CHEST WITH CONTRAST TECHNIQUE: Multidetector CT imaging of the chest was performed using the standard protocol during bolus administration of intravenous contrast. Multiplanar CT image reconstructions and MIPs were obtained to evaluate the vascular anatomy. RADIATION DOSE REDUCTION: This exam was performed according to the departmental dose-optimization program which includes automated exposure control, adjustment of the mA and/or kV according to patient size and/or use of iterative reconstruction technique. CONTRAST:  88mL OMNIPAQUE IOHEXOL 350 MG/ML SOLN COMPARISON:  No priors. FINDINGS: Cardiovascular: There are no filling defects within the pulmonary arterial tree to suggest pulmonary embolism. Heart size is mildly enlarged. There is no significant pericardial fluid, thickening or pericardial calcification. There is aortic atherosclerosis, as well as atherosclerosis of the great vessels of the mediastinum and the coronary arteries, including calcified atherosclerotic plaque in the left main, left anterior descending, left circumflex and right coronary arteries. Mediastinum/Nodes: Multiple prominent borderline enlarged and mildly enlarged mediastinal and hilar lymph nodes are noted, with the largest mediastinal lymph node measuring up to 2 cm in short axis in the subcarinal nodal station and 1.5 cm in the prevascular nodal station. Esophagus is unremarkable in appearance. No axillary lymphadenopathy. Lungs/Pleura: Diffuse ground-glass attenuation and widespread interlobular septal thickening is noted throughout the lungs bilaterally, suggesting a background of  interstitial pulmonary edema. No confluent consolidative airspace disease. No pleural effusions. No definite suspicious appearing pulmonary nodules or masses are noted. Mild centrilobular and paraseptal emphysema predominantly in the lung apices. Upper Abdomen: Aortic atherosclerosis. Musculoskeletal: Healing nondisplaced fracture of the lateral aspect of the left sixth rib. There are no aggressive appearing lytic or blastic lesions noted in the visualized portions of the skeleton. Review of the MIP images confirms the above findings. IMPRESSION: 1. Cardiomegaly with evidence of interstitial pulmonary edema; imaging findings suggesting underlying congestive heart failure. 2. Multiple prominent borderline and mildly enlarged mediastinal and bilateral hilar lymph nodes. This is nonspecific in the setting of congestive heart failure, but follow-up contrast enhanced chest CT is recommended in 1 month after resolution of the patient's acute illness to re-evaluate these findings and ensure regression. Lymphoproliferative disease or other malignancy is not excluded. 3. Aortic atherosclerosis, in addition to left main and three-vessel coronary artery disease. Please note that although the presence of coronary artery calcium documents the presence of coronary artery disease, the severity of this disease and any potential stenosis cannot be assessed on this non-gated CT examination. Assessment for potential risk factor modification, dietary therapy  or pharmacologic therapy may be warranted, if clinically indicated. 4. Healing nondisplaced fracture of the lateral aspect of the left sixth rib. Aortic Atherosclerosis (ICD10-I70.0). Electronically Signed   By: Trudie Reed M.D.   On: 01/05/2022 05:14   DG Chest 2 View  Result Date: 01/05/2022 CLINICAL DATA:  56 year old male with history of chest pain and shortness of breath. EXAM: CHEST - 2 VIEW COMPARISON:  Chest x-ray 01/02/2022. FINDINGS: There is cephalization of  the pulmonary vasculature and slight indistinctness of the interstitial markings suggestive of mild pulmonary edema. No definite pleural effusions. No pneumothorax. Heart size is mildly enlarged. Upper mediastinal contours are within normal limits. IMPRESSION: 1. The appearance of the chest is most suggestive of congestive heart failure, as above. Electronically Signed   By: Trudie Reed M.D.   On: 01/05/2022 05:00   DG Chest Port 1 View  Result Date: 01/03/2022 CLINICAL DATA:  Chest pain EXAM: PORTABLE CHEST 1 VIEW COMPARISON:  01/24/2020 FINDINGS: Lungs are clear.  No pleural effusion or pneumothorax. The heart is normal in size. IMPRESSION: No evidence of acute cardiopulmonary disease. Electronically Signed   By: Charline Bills M.D.   On: 01/03/2022 00:02    EKG: Atrial fibrillation rate of 100 nonspecific ST-T wave changes  ASSESSMENT AND PLAN:  Congestive heart failure Shortness of breath Acute coronary syndrome Atrial fibrillation Coronary artery disease Mild obesity Hypertension Hyperlipidemia Elevated troponins concerning for non-STEMI  Plan Recommend admission maintain on telemetry Continue low-dose diuresis with Lasix IV Hold Eliquis for now in anticipation of cardiac procedure Recommend cardiac cath a.m. of 10/15 for assessment of cardiac anatomy as part of an ischemia work-up Continue aspirin beta-blocker statin therapy possibly nitrates Rate control for atrial fibrillation switch to heparin for anticoagulation in anticipation of cardiac cath Recommend left heart cath with Dr. Darrold Junker  Signed: Alwyn Pea MD, 01/05/2022, 10:10 AM

## 2022-01-05 NOTE — ED Notes (Signed)
Pt has requested update about medications- informed pt that I had spoken with pharmacy and would be getting his meds shortly

## 2022-01-05 NOTE — Assessment & Plan Note (Signed)
Continue amiodarone and metoprolol for rate control Continue apixaban as primary prophylaxis for an acute stroke

## 2022-01-05 NOTE — H&P (Signed)
History and Physical    Patient: Andres [name] DOB: [birth year] DOA: 01/05/2022 DOS: the patient was seen and examined on 01/05/2022 PCP: [facility]  Patient coming from: Home  Chief Complaint:  Chief Complaint  Patient presents with   Chest Pain   Shortness of Breath   HPI: Andres [name] is a 56 y.o. male with medical history significant  for CAD status post PCI with stent angioplasty in LAD in 2013, A-fib on anticoagulation, psoriasis, GERD, insomnia, nicotine dependence, who was recently discharged from the hospital after an admission for A-fib with a rapid ventricular rate.  He was discharged home in stable condition on amiodarone, metoprolol and Eliquis and return to the emergency room in the early hours of the morning for evaluation of shortness of breath and chest pain. Patient presented to the ER via private vehicle for evaluation of sudden onset shortness of breath associated with midsternal chest tightness.  He denied having any radiation of the tightness, no nausea, no vomiting, no diaphoresis, no palpitations, no dizziness, no cough, no fever, no chills, no changes in his bowel habits, no leg swelling, no blurred vision no focal deficit. Patient was hypoxic upon arrival to the ER, he had increased work of breathing and room air pulse oximetry of 86% requiring oxygen supplementation at 5 L to maintain pulse oximetry greater than 92%. Chest x-ray was suggestive of acute CHF and his BNP was elevated He received Lasix 40 mg IV in the ER with improvement in his symptoms and will be admitted to the hospital for further evaluation.   Review of Systems: As mentioned in the history of present illness. All other systems reviewed and are negative. Past Medical History:  Diagnosis Date   A-fib [facility])    Coronary artery disease    History reviewed. No pertinent surgical history. Social History:  reports that he has been smoking cigarettes. He has been  smoking an average of 1 pack per day. He does not have any smokeless tobacco history on file. No history on file for alcohol use and drug use.  Allergies  Allergen Reactions   Statins     Other reaction(s): Unknown    History reviewed. No pertinent family history.  Prior to Admission medications   Medication Sig Start Date End Date Taking? Authorizing Provider  amiodarone (PACERONE) 200 MG tablet Take 1 tablet (200 mg total) by mouth 2 (two) times daily. 01/04/22 02/03/22  Nolberto Hanlon, MD  apixaban (ELIQUIS) 5 MG TABS tablet Take 1 tablet (5 mg total) by mouth 2 (two) times daily. 01/04/22 02/03/22  Nolberto Hanlon, MD  aspirin EC 81 MG tablet Take 1 tablet (81 mg total) by mouth daily. Swallow whole. 01/05/22   Nolberto Hanlon, MD  Cyanocobalamin 1000 MCG TBCR Take 1 tablet by mouth daily. 02/06/20   [provider]  diltiazem (CARDIZEM) 30 MG tablet Take 1 tablet (30 mg total) by mouth 3 (three) times daily as needed for up to 14 days (If HR > 100). 01/04/22 01/18/22  Nolberto Hanlon, MD  FLUoxetine (PROZAC) 10 MG capsule Take 10 mg by mouth daily. 05/25/20   [provider]  HYDROcodone-acetaminophen (NORCO/VICODIN) 5-325 MG tablet Take one tablet at night for pain; may take up to every 6 hours as needed for pain if not working or driving 06/16/69   [provider]  metoprolol tartrate (LOPRESSOR) 50 MG tablet Take 1 tablet (50 mg total) by mouth 2 (two) times daily. 01/04/22 02/03/22  Nolberto Hanlon,  MD  nitroGLYCERIN (NITROSTAT) 0.4 MG SL tablet 1 TABLET UNDER TONGUE AT ONSET OF CHEST PAIN. REPEAT IN 5 MIN IF NOT RESOLVED, MAX 3 DOSES. 911 IF NEEDED. 01/20/19   [provider]  traZODone (DESYREL) 100 MG tablet Take 1 tablet by mouth at bedtime. 07/03/21   [provider]    Physical Exam: Vitals:   01/05/22 0730 01/05/22 0800 01/05/22 0830 01/05/22 0900  BP: 123/77 (!) 133/94 118/72 (!) 132/55  Pulse: 100 99 94 (!) 102  Resp: (!) 22 (!) 25 18 (!) 25   Temp:      TempSrc:      SpO2: 95% 95% 95% 94%  Weight:      Height:       Physical Exam Vitals and nursing note reviewed.  Constitutional:      Appearance: He is well-developed.  HENT:     Head: Normocephalic and atraumatic.  Cardiovascular:     Rate and Rhythm: Tachycardia present. Rhythm irregular.     Heart sounds: Normal heart sounds.  Pulmonary:     Effort: Tachypnea present.     Breath sounds: Examination of the right-lower field reveals rales. Examination of the left-lower field reveals rales. Rales present.  Abdominal:     General: Bowel sounds are normal.     Palpations: Abdomen is soft.  Musculoskeletal:        General: Normal range of motion.  Skin:    General: Skin is warm and dry.  Neurological:     General: No focal deficit present.     Mental Status: He is alert.  Psychiatric:        Mood and Affect: Mood normal.        Behavior: Behavior normal.    Data Reviewed: Relevant notes from primary care and specialist visits, past discharge summaries as available in EHR, including Care Everywhere. Prior diagnostic testing as pertinent to current admission diagnoses Updated medications and problem lists for reconciliation ED course, including vitals, labs, imaging, treatment and response to treatment Triage notes, nursing and pharmacy notes and ED provider's notes Notable results as noted in HPI Labs reviewed.  BNP 858, troponin 297 >> 445, sodium 138, potassium 3.7, chloride 109, bicarb 21, glucose 101, BUN 16, creatinine 0.99, calcium 8.7, TSH 8.919, free T40.74, white count 18.7, hemoglobin 15.9, hematocrit 45.3, platelet count 297 Respiratory viral panel is negative Chest x-ray reviewed by me shows  cephalization of the pulmonary vasculature and slight indistinctness of the interstitial markings suggestive of mild pulmonary edema. CT angiogram of the chest shows cardiomegaly with evidence of interstitial pulmonary edema;imaging findings suggesting underlying  congestive heart failure. Multiple prominent borderline and mildly enlarged mediastinal and bilateral hilar lymph nodes. This is nonspecific in the setting of congestive heart failure, but follow-up contrast enhanced chest CT is recommended in 1 month after resolution of the patient's acute illness to re-evaluate these findings and ensure regression. Lymphoproliferative disease or other malignancy is not excluded. Aortic atherosclerosis, in addition to left main and three-vessel coronary artery disease. Healing nondisplaced fracture of the lateral aspect of the left sixth rib. Twelve-lead EKG reviewed by me shows atrial fibrillation There are no new results to review at this time.  Assessment and Plan: * CHF (congestive heart failure) ([place]) Acute CHF of unclear etiology Patient presented for sudden onset shortness of breath and was hypoxic with room air pulse oximetry of 86% requiring oxygen supplementation Chest x-ray showed findings suggestive of CHF and BNP was elevated. Place patient on Lasix 40  mg IV daily Obtain 2D echocardiogram to assess LVEF Continue metoprolol We will consult cardiology  Acute respiratory failure with hypoxia ([place]) Secondary to acute CHF Patient presented to the ER for evaluation of shortness of breath with increased work of breathing, use of accessory muscles and hypoxia with room air pulse oximetry of 86% and. He is currently on 5 L of oxygen with improvement in pulse oximetry to greater than 92% We will attempt to wean patient off oxygen as tolerated once acute illness is improved or resolved  CAD S/P percutaneous coronary angioplasty History of coronary artery disease status post stent angioplasty CT angiogram of the chest shows aortic atherosclerosis, in addition to left main and three-vessel coronary artery disease. Continue aspirin and metoprolol Consult cardiology  Tobacco use disorder Smoking cessation has been discussed with patient in detail He  declines a nicotine transdermal patch at this time  Leukocytosis Unclear etiology and no evidence of an infectious process at this time. CT angiogram shows multiple prominent borderline and mildly enlarged mediastinal and bilateral hilar lymph nodes. This is nonspecific in the setting of congestive heart failure, but follow-up contrast enhanced chest CT is recommended in 1 month after resolution of the patient's acute illness to re-evaluate these findings and ensure regression  Atrial fibrillation, persistent (HCC) Continue amiodarone and metoprolol for rate control Continue apixaban as primary prophylaxis for an acute stroke  Elevated troponin In a patient with known coronary artery disease status post stent angioplasty We will cycle cardiac enzymes Continue aspirin and metoprolol Follow-up results of 2D echocardiogram to assess LVEF and rule out regional wall motion abnormality      Advance Care Planning:   Code Status: Full Code   Consults: Cardiology  Family Communication: Greater than 50% of time was spent discussing patient's condition and plan of care with him and his wife at the bedside.  All questions and concerns have been addressed.  They verbalized understanding and agree with the plan.  Severity of Illness: The appropriate patient status for this patient is INPATIENT. Inpatient status is judged to be reasonable and necessary in order to provide the required intensity of service to ensure the patient's safety. The patient's presenting symptoms, physical exam findings, and initial radiographic and laboratory data in the context of their chronic comorbidities is felt to place them at high risk for further clinical deterioration. Furthermore, it is not anticipated that the patient will be medically stable for discharge from the hospital within 2 midnights of admission.   * I certify that at the point of admission it is my clinical judgment that the patient will require inpatient  hospital care spanning beyond 2 midnights from the point of admission due to high intensity of service, high risk for further deterioration and high frequency of surveillance required.*  Author: Collier Bullock, MD 01/05/2022 9:14 AM  For on call review [email or web address].

## 2022-01-05 NOTE — Assessment & Plan Note (Signed)
Smoking cessation has been discussed with patient in detail He declines a nicotine transdermal patch at this time 

## 2022-01-05 NOTE — Assessment & Plan Note (Signed)
Unclear etiology and no evidence of an infectious process at this time. CT angiogram shows multiple prominent borderline and mildly enlarged mediastinal and bilateral hilar lymph nodes. This is nonspecific in the setting of congestive heart failure, but follow-up contrast enhanced chest CT is recommended in 1 month after resolution of the patient's acute illness to re-evaluate these findings and ensure regression

## 2022-01-05 NOTE — ED Triage Notes (Signed)
Pt presents to ER from home with complaints of shortness of breath and chest pain. Pt reports he was seen for the same yesterday. Pt denies any other symptom at present

## 2022-01-05 NOTE — Assessment & Plan Note (Deleted)
Continue amiodarone and metoprolol for rate control Continue apixaban as primary prophylaxis for an acute stroke 

## 2022-01-05 NOTE — ED Provider Notes (Addendum)
[facility] Provider Note    Event Date/Time   First MD Initiated Contact with Patient 01/05/22 [phone]     (approximate)   History   Chest Pain and Shortness of Breath   HPI  Andres [name] is a 56 y.o. male who returns to the ED from home with a chief complaint of shortness of breath and chest pain.  Patient was recently admitted and discharged yesterday afternoon for atrial fibrillation with RVR.  He was placed on Eliquis, amiodarone and metoprolol.  States he is compliant with his medications.  Awoke prior to arrival with shortness of breath and associated chest tightness.  Denies fever, cough, abdominal pain, diaphoresis, nausea/vomiting, palpitations or dizziness.     Past Medical History   Past Medical History:  Diagnosis Date   A-fib [facility])      Active Problem List   Patient Active Problem List   Diagnosis Date Noted   Atrial flutter with rapid ventricular response ([place]) 01/03/2022   Psoriasis 01/03/2022   Tobacco use disorder 01/03/2022   Chest pain 01/03/2022   Acute bronchitis 01/03/2022   CAD S/P percutaneous coronary angioplasty 11/02/2014   Hx of myocardial infarction 08/13/2012   Ischemic cardiomyopathy 10/21/2011     Past Surgical History  See chart   Home Medications   Prior to Admission medications   Medication Sig Start Date End Date Taking? Authorizing Provider  amiodarone (PACERONE) 200 MG tablet Take 1 tablet (200 mg total) by mouth 2 (two) times daily. 01/04/22 02/03/22  Nolberto Hanlon, MD  apixaban (ELIQUIS) 5 MG TABS tablet Take 1 tablet (5 mg total) by mouth 2 (two) times daily. 01/04/22 02/03/22  Nolberto Hanlon, MD  aspirin EC 81 MG tablet Take 1 tablet (81 mg total) by mouth daily. Swallow whole. 01/05/22   Nolberto Hanlon, MD  Cyanocobalamin 1000 MCG TBCR Take 1 tablet by mouth daily. 02/06/20   [provider]  diltiazem (CARDIZEM) 30 MG tablet Take 1 tablet (30 mg total) by mouth 3 (three) times daily as  needed for up to 14 days (If HR > 100). 01/04/22 01/18/22  Nolberto Hanlon, MD  FLUoxetine (PROZAC) 10 MG capsule Take 10 mg by mouth daily. 05/25/20   [provider]  HYDROcodone-acetaminophen (NORCO/VICODIN) 5-325 MG tablet Take one tablet at night for pain; may take up to every 6 hours as needed for pain if not working or driving 09/13/27   [provider]  metoprolol tartrate (LOPRESSOR) 50 MG tablet Take 1 tablet (50 mg total) by mouth 2 (two) times daily. 01/04/22 02/03/22  Nolberto Hanlon, MD  nitroGLYCERIN (NITROSTAT) 0.4 MG SL tablet 1 TABLET UNDER TONGUE AT ONSET OF CHEST PAIN. REPEAT IN 5 MIN IF NOT RESOLVED, MAX 3 DOSES. 911 IF NEEDED. 01/20/19   [provider]  traZODone (DESYREL) 100 MG tablet Take 1 tablet by mouth at bedtime. 07/03/21   [provider]     Allergies  Statins   Family History  No family history on file.   Physical Exam  Triage Vital Signs: ED Triage Vitals  Enc Vitals Group     BP --      Pulse --      Resp --      Temp --      Temp src --      SpO2 --      Weight 01/05/22 0347 250 lb (113.4 kg)     Height 01/05/22 0347 6' (1.829 m)     Head  Circumference --      Peak Flow --      Pain Score 01/05/22 0346 6     Pain Loc --      Pain Edu? --      Excl. in GC? --     Updated Vital Signs: BP (!) 152/88   Pulse 61   Temp 97.7 F (36.5 C) (Oral)   Resp (!) 32   Ht 6' (1.829 m)   Wt 113.4 kg   SpO2 94%   BMI 33.91 kg/m    General: Awake, moderate distress.  CV:  Regular rate, irregular rhythm.  Good peripheral perfusion.  Resp:  Increased effort.  Faint bibasilar rales. Abd:  Nontender.  No distention.  Other:  Bilateral calves are nonswollen and nontender.   ED Results / Procedures / Treatments  Labs (all labs ordered are listed, but only abnormal results are displayed) Labs Reviewed  BASIC METABOLIC PANEL - Abnormal; Notable for the following components:      Result Value   CO2 21 (*)    Glucose,  Bld 101 (*)    Calcium 8.7 (*)    All other components within normal limits  CBC - Abnormal; Notable for the following components:   WBC 18.7 (*)    All other components within normal limits  TSH - Abnormal; Notable for the following components:   TSH 8.919 (*)    All other components within normal limits  BRAIN NATRIURETIC PEPTIDE - Abnormal; Notable for the following components:   B Natriuretic Peptide 858.3 (*)    All other components within normal limits  TROPONIN I (HIGH SENSITIVITY) - Abnormal; Notable for the following components:   Troponin I (High Sensitivity) 228 (*)    All other components within normal limits  TROPONIN I (HIGH SENSITIVITY) - Abnormal; Notable for the following components:   Troponin I (High Sensitivity) 297 (*)    All other components within normal limits  RESP PANEL BY RT-PCR (FLU A&B, COVID) ARPGX2  T4, FREE     EKG  ED ECG REPORT I, Priscella Donna J, the attending physician, personally viewed and interpreted this ECG.   Date: 01/05/2022  EKG Time: 0351  Rate: 92  Rhythm: atrial fibrillation, rate 92  Axis: Normal  Intervals:none  ST&T Change: Nonspecific    RADIOLOGY I have independently visualized and interpreted patient's x-ray and CTA chest as well as noted the radiology interpretation:  X-ray: CHF  CTA chest: Cardiomegaly w/ interstitial edema  Official radiology report(s): CT Angio Chest PE W/Cm &/Or Wo Cm  Result Date: 01/05/2022 CLINICAL DATA:  56 year old male with suspected pulmonary embolism. EXAM: CT ANGIOGRAPHY CHEST WITH CONTRAST TECHNIQUE: Multidetector CT imaging of the chest was performed using the standard protocol during bolus administration of intravenous contrast. Multiplanar CT image reconstructions and MIPs were obtained to evaluate the vascular anatomy. RADIATION DOSE REDUCTION: This exam was performed according to the departmental dose-optimization program which includes automated exposure control, adjustment of the  mA and/or kV according to patient size and/or use of iterative reconstruction technique. CONTRAST:  51mL OMNIPAQUE IOHEXOL 350 MG/ML SOLN COMPARISON:  No priors. FINDINGS: Cardiovascular: There are no filling defects within the pulmonary arterial tree to suggest pulmonary embolism. Heart size is mildly enlarged. There is no significant pericardial fluid, thickening or pericardial calcification. There is aortic atherosclerosis, as well as atherosclerosis of the great vessels of the mediastinum and the coronary arteries, including calcified atherosclerotic plaque in the left main, left anterior descending, left circumflex and right coronary  arteries. Mediastinum/Nodes: Multiple prominent borderline enlarged and mildly enlarged mediastinal and hilar lymph nodes are noted, with the largest mediastinal lymph node measuring up to 2 cm in short axis in the subcarinal nodal station and 1.5 cm in the prevascular nodal station. Esophagus is unremarkable in appearance. No axillary lymphadenopathy. Lungs/Pleura: Diffuse ground-glass attenuation and widespread interlobular septal thickening is noted throughout the lungs bilaterally, suggesting a background of interstitial pulmonary edema. No confluent consolidative airspace disease. No pleural effusions. No definite suspicious appearing pulmonary nodules or masses are noted. Mild centrilobular and paraseptal emphysema predominantly in the lung apices. Upper Abdomen: Aortic atherosclerosis. Musculoskeletal: Healing nondisplaced fracture of the lateral aspect of the left sixth rib. There are no aggressive appearing lytic or blastic lesions noted in the visualized portions of the skeleton. Review of the MIP images confirms the above findings. IMPRESSION: 1. Cardiomegaly with evidence of interstitial pulmonary edema; imaging findings suggesting underlying congestive heart failure. 2. Multiple prominent borderline and mildly enlarged mediastinal and bilateral hilar lymph nodes. This  is nonspecific in the setting of congestive heart failure, but follow-up contrast enhanced chest CT is recommended in 1 month after resolution of the patient's acute illness to re-evaluate these findings and ensure regression. Lymphoproliferative disease or other malignancy is not excluded. 3. Aortic atherosclerosis, in addition to left main and three-vessel coronary artery disease. Please note that although the presence of coronary artery calcium documents the presence of coronary artery disease, the severity of this disease and any potential stenosis cannot be assessed on this non-gated CT examination. Assessment for potential risk factor modification, dietary therapy or pharmacologic therapy may be warranted, if clinically indicated. 4. Healing nondisplaced fracture of the lateral aspect of the left sixth rib. Aortic Atherosclerosis (ICD10-I70.0). Electronically Signed   By: Trudie Reed M.D.   On: 01/05/2022 05:14   DG Chest 2 View  Result Date: 01/05/2022 CLINICAL DATA:  56 year old male with history of chest pain and shortness of breath. EXAM: CHEST - 2 VIEW COMPARISON:  Chest x-ray 01/02/2022. FINDINGS: There is cephalization of the pulmonary vasculature and slight indistinctness of the interstitial markings suggestive of mild pulmonary edema. No definite pleural effusions. No pneumothorax. Heart size is mildly enlarged. Upper mediastinal contours are within normal limits. IMPRESSION: 1. The appearance of the chest is most suggestive of congestive heart failure, as above. Electronically Signed   By: Trudie Reed M.D.   On: 01/05/2022 05:00     PROCEDURES:  Critical Care performed: Yes, see critical care procedure note(s)  CRITICAL CARE Performed by: Irean Hong   Total critical care time: 45 minutes  Critical care time was exclusive of separately billable procedures and treating other patients.  Critical care was necessary to treat or prevent imminent or life-threatening  deterioration.  Critical care was time spent personally by me on the following activities: development of treatment plan with patient and/or surrogate as well as nursing, discussions with consultants, evaluation of patient's response to treatment, examination of patient, obtaining history from patient or surrogate, ordering and performing treatments and interventions, ordering and review of laboratory studies, ordering and review of radiographic studies, pulse oximetry and re-evaluation of patient's condition.   Marland Kitchen1-3 Lead EKG Interpretation  Performed by: Irean Hong, MD Authorized by: Irean Hong, MD     Interpretation: abnormal     ECG rate:  92   ECG rate assessment: normal     Rhythm: atrial fibrillation     Ectopy: none     Conduction: normal   Comments:  Patient placed on cardiac monitor to evaluate for arrhythmias    MEDICATIONS ORDERED IN ED: Medications  albuterol (PROVENTIL) (2.5 MG/3ML) 0.083% nebulizer solution 2.5 mg (has no administration in time range)  ondansetron (ZOFRAN) injection 4 mg (has no administration in time range)  HYDROmorphone (DILAUDID) injection 0.5 mg (has no administration in time range)  iohexol (OMNIPAQUE) 350 MG/ML injection 75 mL (75 mLs Intravenous Contrast Given 01/05/22 0433)  furosemide (LASIX) injection 40 mg (40 mg Intravenous Given 01/05/22 0458)     IMPRESSION / MDM / ASSESSMENT AND PLAN / ED COURSE  I reviewed the triage vital signs and the nursing notes.                             56 year old male presenting with shortness of breath status post recent hospitalization for atrial fibrillation with RVR. Differential includes, but is not limited to, viral syndrome, bronchitis including COPD exacerbation, pneumonia, reactive airway disease including asthma, CHF including exacerbation with or without pulmonary/interstitial edema, pneumothorax, ACS, thoracic trauma, and pulmonary embolism.  I have personally reviewed patient's records  including his recent hospitalization 01/02/2022.  Patient's presentation is most consistent with acute presentation with potential threat to life or bodily function.  The patient is on the cardiac monitor to evaluate for evidence of arrhythmia and/or significant heart rate changes.  Patient room air saturation 86%; increased to 95% on 3 L nasal cannula oxygen.  He was brought straight back to a treatment room from triage.  Will obtain cardiac panel, chest x-ray.  Noted his most recent x-ray was clear on hospitalization; will obtain CTA chest to evaluate for PE.  Anticipate hospitalization for oxygen requirement.  Clinical Course as of 01/05/22 3664  [facility] Jan 05, 2022  0459 Breathing more labored, oxygen increased to 5 L.  Wet read of chest x-ray indicates pulmonary edema, will administer IV Lasix.  Consider BiPAP if patient worsens. [JS]  0612 Troponin 228 which is trending down from yesterday.  CTA negative for PE.  Will consult hospital services for evaluation and admission. [JS]    Clinical Course User Index [JS] Irean Hong, MD     FINAL CLINICAL IMPRESSION(S) / ED DIAGNOSES   Final diagnoses:  SOB (shortness of breath)  Chest pain, unspecified type  Hypoxia  Acute congestive heart failure, unspecified heart failure type [facility])     Rx / DC Orders   ED Discharge Orders     None        Note:  This document was prepared using Dragon voice recognition software and may include unintentional dictation errors.   Irean Hong, MD 01/05/22 4034    Irean Hong, MD 01/05/22 [phone]

## 2022-01-05 NOTE — ED Notes (Signed)
Dr Jerrye Beavers at bedside

## 2022-01-05 NOTE — Progress Notes (Signed)
       CROSS COVER NOTE  NAME: Andres [name] MRN: [id number] DOB : Oct 02, 1965    Date of Service   01/05/2022   HPI/Events of Note   Ongoing dyspnea since noon today that is not as bad as prior to ED arrival. Patient reports he felt relief from Lasix dose at 0500 and symptoms returned around noon. No increase in oxygen requirement. Symptoms are worse when laying down.  SPO2 95% on 3L. No increased work of breathing per [employer].  Interventions   Assessment/Plan:  Acute Respiratory Failure with Hypoxia secondary to CHF 40 mg IV Lasix PRN Albuterol order expired, will add PRN Xopenex in setting of AFIB w RVR      This document was prepared using Dragon voice recognition software and may include unintentional dictation errors.  Neomia Glass DNP, MBA, FNP-BC Nurse Practitioner [facility] Pager [phone]

## 2022-01-05 NOTE — ED Notes (Signed)
Dr Agbata at bedside. 

## 2022-01-05 NOTE — ED Notes (Signed)
Pt given breakfast tray

## 2022-01-05 NOTE — Assessment & Plan Note (Signed)
Acute CHF of unclear etiology Patient presented for sudden onset shortness of breath and was hypoxic with room air pulse oximetry of 86% requiring oxygen supplementation Chest x-ray showed findings suggestive of CHF and BNP was elevated. Place patient on Lasix 40 mg IV daily Obtain 2D echocardiogram to assess LVEF Continue metoprolol We will consult cardiology

## 2022-01-05 NOTE — Assessment & Plan Note (Signed)
Secondary to acute CHF Patient presented to the ER for evaluation of shortness of breath with increased work of breathing, use of accessory muscles and hypoxia with room air pulse oximetry of 86% and. He is currently on 5 L of oxygen with improvement in pulse oximetry to greater than 92% We will attempt to wean patient off oxygen as tolerated once acute illness is improved or resolved

## 2022-01-06 ENCOUNTER — Other Ambulatory Visit: Payer: Self-pay

## 2022-01-06 ENCOUNTER — Encounter: Payer: Self-pay | Admitting: Internal Medicine

## 2022-01-06 ENCOUNTER — Encounter
Admission: EM | Disposition: A | Discharge status: Home / Self Care | Payer: Self-pay | Source: Home / Self Care | Attending: Internal Medicine

## 2022-01-06 DIAGNOSIS — R300 Dysuria: Secondary | ICD-10-CM

## 2022-01-06 DIAGNOSIS — I509 Heart failure, unspecified: Secondary | ICD-10-CM | POA: Diagnosis not present

## 2022-01-06 DIAGNOSIS — I4819 Other persistent atrial fibrillation: Secondary | ICD-10-CM | POA: Diagnosis not present

## 2022-01-06 DIAGNOSIS — J9601 Acute respiratory failure with hypoxia: Secondary | ICD-10-CM

## 2022-01-06 DIAGNOSIS — I2510 Atherosclerotic heart disease of native coronary artery without angina pectoris: Secondary | ICD-10-CM | POA: Diagnosis not present

## 2022-01-06 HISTORY — PX: LEFT HEART CATH AND CORONARY ANGIOGRAPHY: CATH118249

## 2022-01-06 LAB — CBC
HCT: 39.9 % (ref 39.0–52.0)
Hemoglobin: 13.7 g/dL (ref 13.0–17.0)
MCH: 29.5 pg (ref 26.0–34.0)
MCHC: 34.3 g/dL (ref 30.0–36.0)
MCV: 86 fL (ref 80.0–100.0)
Platelets: 251 10*3/uL (ref 150–400)
RBC: 4.64 MIL/uL (ref 4.22–5.81)
RDW: 13.1 % (ref 11.5–15.5)
WBC: 11.9 10*3/uL — ABNORMAL HIGH (ref 4.0–10.5)
nRBC: 0 % (ref 0.0–0.2)

## 2022-01-06 LAB — BASIC METABOLIC PANEL
Anion gap: 8 (ref 5–15)
BUN: 23 mg/dL — ABNORMAL HIGH (ref 6–20)
CO2: 26 mmol/L (ref 22–32)
Calcium: 8.7 mg/dL — ABNORMAL LOW (ref 8.9–10.3)
Chloride: 103 mmol/L (ref 98–111)
Creatinine, Ser: 1.17 mg/dL (ref 0.61–1.24)
GFR, Estimated: >60 mL/min (ref >60)
Glucose, Bld: 114 mg/dL — ABNORMAL HIGH (ref 70–99)
Potassium: 3.8 mmol/L (ref 3.5–5.1)
Sodium: 137 mmol/L (ref 135–145)

## 2022-01-06 LAB — URINALYSIS, COMPLETE (UACMP) WITH MICROSCOPIC
Bacteria, UA: NONE SEEN
Bilirubin Urine: NEGATIVE
Glucose, UA: NEGATIVE mg/dL
Ketones, ur: NEGATIVE mg/dL
Leukocytes,Ua: NEGATIVE
Nitrite: NEGATIVE
Protein, ur: NEGATIVE mg/dL
RBC / HPF: >50 RBC/hpf — ABNORMAL HIGH (ref 0–5)
Specific Gravity, Urine: 1.014 (ref 1.005–1.030)
Squamous Epithelial / HPF: NONE SEEN (ref 0–5)
pH: 6 (ref 5.0–8.0)

## 2022-01-06 SURGERY — LEFT HEART CATH AND CORONARY ANGIOGRAPHY
Anesthesia: Moderate Sedation

## 2022-01-06 MED ORDER — SODIUM CHLORIDE 0.9 % IV SOLN
1.0000 g | INTRAVENOUS | Status: DC
  Administered 2022-01-06 – 2022-01-07 (×2): 1 g via INTRAVENOUS
  Filled 2022-01-06 (×2): qty 10

## 2022-01-06 MED ORDER — LIDOCAINE HCL (PF) 1 % IJ SOLN
INTRAMUSCULAR | Status: DC | PRN
  Administered 2022-01-06 (×3): 2 mL

## 2022-01-06 MED ORDER — HEPARIN SODIUM (PORCINE) 1000 UNIT/ML IJ SOLN
INTRAMUSCULAR | Status: AC
  Filled 2022-01-06: qty 10

## 2022-01-06 MED ORDER — ASPIRIN 81 MG PO CHEW
CHEWABLE_TABLET | ORAL | Status: AC
  Filled 2022-01-06: qty 1

## 2022-01-06 MED ORDER — SODIUM CHLORIDE 0.9 % IV SOLN: 250.0000 mL | INTRAVENOUS | Status: DC | PRN

## 2022-01-06 MED ORDER — IOHEXOL 300 MG/ML  SOLN
INTRAMUSCULAR | Status: DC | PRN
  Administered 2022-01-06: 90 mL

## 2022-01-06 MED ORDER — MIDAZOLAM HCL 2 MG/2ML IJ SOLN
INTRAMUSCULAR | Status: AC
  Filled 2022-01-06: qty 2

## 2022-01-06 MED ORDER — HEPARIN (PORCINE) IN NACL 1000-0.9 UT/500ML-% IV SOLN
INTRAVENOUS | Status: AC
  Filled 2022-01-06: qty 1000

## 2022-01-06 MED ORDER — SODIUM CHLORIDE 0.9% FLUSH
3.0000 mL | Freq: Two times a day (BID) | INTRAVENOUS | Status: DC
  Administered 2022-01-07 – 2022-01-08 (×2): 3 mL via INTRAVENOUS

## 2022-01-06 MED ORDER — FUROSEMIDE 10 MG/ML IJ SOLN
INTRAMUSCULAR | Status: DC | PRN
  Administered 2022-01-06: 40 mg via INTRAVENOUS

## 2022-01-06 MED ORDER — POTASSIUM CHLORIDE CRYS ER 20 MEQ PO TBCR
40.0000 meq | EXTENDED_RELEASE_TABLET | Freq: Once | ORAL | Status: AC
  Administered 2022-01-06: 40 meq via ORAL
  Filled 2022-01-06: qty 2

## 2022-01-06 MED ORDER — SODIUM CHLORIDE 0.9 % WEIGHT BASED INFUSION: 1.0000 mL/kg/h | INTRAVENOUS | Status: AC

## 2022-01-06 MED ORDER — APIXABAN 5 MG PO TABS
5.0000 mg | ORAL_TABLET | Freq: Two times a day (BID) | ORAL | Status: DC
  Administered 2022-01-07 – 2022-01-08 (×3): 5 mg via ORAL
  Filled 2022-01-06 (×3): qty 1

## 2022-01-06 MED ORDER — ASPIRIN 81 MG PO CHEW
81.0000 mg | CHEWABLE_TABLET | ORAL | Status: AC
  Administered 2022-01-06: 81 mg via ORAL

## 2022-01-06 MED ORDER — ACETAMINOPHEN 325 MG PO TABS: 650.0000 mg | ORAL_TABLET | ORAL | Status: DC | PRN

## 2022-01-06 MED ORDER — FUROSEMIDE 40 MG PO TABS: 40.0000 mg | ORAL_TABLET | Freq: Every day | ORAL | Status: DC

## 2022-01-06 MED ORDER — HEPARIN (PORCINE) IN NACL 1000-0.9 UT/500ML-% IV SOLN
INTRAVENOUS | Status: DC | PRN
  Administered 2022-01-06 (×2): 500 mL

## 2022-01-06 MED ORDER — FUROSEMIDE 10 MG/ML IJ SOLN
INTRAMUSCULAR | Status: AC
  Filled 2022-01-06: qty 4

## 2022-01-06 MED ORDER — FUROSEMIDE 10 MG/ML IJ SOLN
40.0000 mg | Freq: Two times a day (BID) | INTRAMUSCULAR | Status: DC
  Administered 2022-01-06: 40 mg via INTRAVENOUS
  Filled 2022-01-06: qty 4

## 2022-01-06 MED ORDER — FENTANYL CITRATE (PF) 100 MCG/2ML IJ SOLN
INTRAMUSCULAR | Status: DC | PRN
  Administered 2022-01-06 (×2): 25 ug via INTRAVENOUS

## 2022-01-06 MED ORDER — FENTANYL CITRATE (PF) 100 MCG/2ML IJ SOLN
INTRAMUSCULAR | Status: AC
  Filled 2022-01-06: qty 2

## 2022-01-06 MED ORDER — SODIUM CHLORIDE 0.9% FLUSH: 3.0000 mL | INTRAVENOUS | Status: DC | PRN

## 2022-01-06 MED ORDER — VERAPAMIL HCL 2.5 MG/ML IV SOLN
INTRAVENOUS | Status: AC
  Filled 2022-01-06: qty 2

## 2022-01-06 MED ORDER — VERAPAMIL HCL 2.5 MG/ML IV SOLN
INTRAVENOUS | Status: DC | PRN
  Administered 2022-01-06 (×2): 2.5 mg via INTRA_ARTERIAL

## 2022-01-06 MED ORDER — HYDRALAZINE HCL 20 MG/ML IJ SOLN: 10.0000 mg | INTRAMUSCULAR | Status: AC | PRN

## 2022-01-06 MED ORDER — HEPARIN SODIUM (PORCINE) 1000 UNIT/ML IJ SOLN
INTRAMUSCULAR | Status: DC | PRN
  Administered 2022-01-06: 5000 [IU] via INTRAVENOUS

## 2022-01-06 MED ORDER — SODIUM CHLORIDE 0.9 % WEIGHT BASED INFUSION: 1.0000 mL/kg/h | INTRAVENOUS | Status: DC

## 2022-01-06 MED ORDER — ONDANSETRON HCL 4 MG/2ML IJ SOLN: 4.0000 mg | Freq: Four times a day (QID) | INTRAMUSCULAR | Status: DC | PRN

## 2022-01-06 MED ORDER — MIDAZOLAM HCL 2 MG/2ML IJ SOLN
INTRAMUSCULAR | Status: DC | PRN
  Administered 2022-01-06 (×2): 1 mg via INTRAVENOUS

## 2022-01-06 MED ORDER — LABETALOL HCL 5 MG/ML IV SOLN: 10.0000 mg | INTRAVENOUS | Status: AC | PRN

## 2022-01-06 MED ORDER — LIDOCAINE HCL 1 % IJ SOLN
INTRAMUSCULAR | Status: AC
  Filled 2022-01-06: qty 20

## 2022-01-06 MED ORDER — AMIODARONE HCL 200 MG PO TABS
200.0000 mg | ORAL_TABLET | Freq: Two times a day (BID) | ORAL | Status: DC
  Administered 2022-01-06 – 2022-01-08 (×5): 200 mg via ORAL
  Filled 2022-01-06 (×5): qty 1

## 2022-01-06 MED ORDER — SODIUM CHLORIDE 0.9 % WEIGHT BASED INFUSION
3.0000 mL/kg/h | INTRAVENOUS | Status: DC
  Administered 2022-01-06: 3 mL/kg/h via INTRAVENOUS

## 2022-01-06 MED ORDER — AMIODARONE HCL 200 MG PO TABS: 200.0000 mg | ORAL_TABLET | Freq: Every day | ORAL | Status: DC

## 2022-01-06 SURGICAL SUPPLY — 12 items
CATH 5FR JL3.5 JR4 ANG PIG MP (CATHETERS) IMPLANT
DEVICE RAD TR BAND REGULAR (VASCULAR PRODUCTS) IMPLANT
DRAPE BRACHIAL (DRAPES) IMPLANT
GLIDESHEATH SLEND SS 6F .021 (SHEATH) IMPLANT
GUIDEWIRE INQWIRE 1.5J.035X260 (WIRE) IMPLANT
INQWIRE 1.5J .035X260CM (WIRE) ×1
KIT SYRINGE INJ CVI SPIKEX1 (MISCELLANEOUS) IMPLANT
PACK CARDIAC CATH (CUSTOM PROCEDURE TRAY) ×1 IMPLANT
PROTECTION STATION PRESSURIZED (MISCELLANEOUS) ×1
SET ATX SIMPLICITY (MISCELLANEOUS) IMPLANT
STATION PROTECTION PRESSURIZED (MISCELLANEOUS) IMPLANT
WIRE HITORQ VERSACORE ST 145CM (WIRE) IMPLANT

## 2022-01-06 NOTE — Progress Notes (Signed)
PROGRESS NOTE    Andres [name]  ZOX:[id number] DOB: 1965-11-14 DOA: 01/05/2022 PCP: [place], [facility]    Brief Narrative:  Andres CollegeJoel A [name] is a 56 y.o. male with medical history significant  for CAD status post PCI with stent angioplasty in LAD in 2013, A-fib on anticoagulation, psoriasis, GERD, insomnia, nicotine dependence, who was recently discharged from the hospital after an admission for A-fib with a rapid ventricular rate.  He was discharged home in stable condition on amiodarone, metoprolol and Eliquis and return to the emergency room in the early hours of the morning for evaluation of shortness of breath and chest pain. Patient presented to the ER via private vehicle for evaluation of sudden onset shortness of breath associated with midsternal chest tightness.  He denied having any radiation of the tightness, no nausea, no vomiting, no diaphoresis, no palpitations, no dizziness, no cough, no fever, no chills, no changes in his bowel habits, no leg swelling, no blurred vision no focal deficit. Patient was hypoxic upon arrival to the ER, he had increased work of breathing and room air pulse oximetry of 86% requiring oxygen supplementation at 5 L to maintain pulse oximetry greater than 92%. Chest x-ray was suggestive of acute CHF and his BNP was elevated He received Lasix 40 mg IV in the ER with improvement in his symptoms and will be admitted to the hospital for further evaluation.    Consultants:  cardiology  Procedures: cath  Antimicrobials:      Subjective: Felt sob earlier, s/p cath. C/o burning on urination   Objective: Vitals:   01/06/22 1300 01/06/22 1400 01/06/22 1500 01/06/22 1549  BP: (!) 151/95 (!) 163/113 (!) 156/96 (!) 153/91  Pulse: 90 91 84 96  Resp: 19 19 14 16   Temp:    97.7 F (36.5 C)  TempSrc:    Oral  SpO2: 92% 95% 97% 96%  Weight:      Height:        Intake/Output Summary (Last 24 hours) at 01/06/2022 1632 Last data filed at  01/06/2022 1505 Gross per 24 hour  Intake 390 ml  Output 2350 ml  Net -1960 ml   Filed Weights   01/05/22 0347 01/06/22 0713  Weight: 113.4 kg 114.3 kg    Examination: Calm, NAD Crackles , no wheezing Reg s1/s2 no gallop Soft benign +bs  Mild edema b/l Aaoxox3  Mood and affect appropriate in current setting     Data Reviewed: I have personally reviewed following labs and imaging studies  CBC: Recent Labs  Lab 01/02/22 2337 01/05/22 0400 01/06/22 0532  WBC 9.7 18.7* 11.9*  HGB 15.2 15.9 13.7  HCT 44.0 45.3 39.9  MCV 87.0 86.0 86.0  PLT 235 297 251   Basic Metabolic Panel: Recent Labs  Lab 01/02/22 2337 01/05/22 0400 01/05/22 2013 01/06/22 0532  NA 138 138 138 137  K 3.5 3.7 3.9 3.8  CL 105 109 102 103  CO2 23 21* 27 26  GLUCOSE 171* 101* 104* 114*  BUN 16 16 20  23*  CREATININE 1.13 0.99 1.14 1.17  CALCIUM 9.1 8.7* 9.1 8.7*  MG  --   --  2.2  --    GFR: Estimated Creatinine Clearance: 90.9 mL/min (by C-G formula based on SCr of 1.17 mg/dL). Liver Function Tests: No results for input(s): "AST", "ALT", "ALKPHOS", "BILITOT", "PROT", "ALBUMIN" in the last 168 hours. No results for input(s): "LIPASE", "AMYLASE" in the last 168 hours. No results for input(s): "AMMONIA" in the last 168  hours. Coagulation Profile: No results for input(s): "INR", "PROTIME" in the last 168 hours. Cardiac Enzymes: No results for input(s): "CKTOTAL", "CKMB", "CKMBINDEX", "TROPONINI" in the last 168 hours. BNP (last 3 results) No results for input(s): "PROBNP" in the last 8760 hours. HbA1C: No results for input(s): "HGBA1C" in the last 72 hours. CBG: No results for input(s): "GLUCAP" in the last 168 hours. Lipid Profile: No results for input(s): "CHOL", "HDL", "LDLCALC", "TRIG", "CHOLHDL", "LDLDIRECT" in the last 72 hours. Thyroid Function Tests: Recent Labs    01/05/22 0400  TSH 8.919*  FREET4 0.74   Anemia Panel: No results for input(s): "VITAMINB12", "FOLATE",  "FERRITIN", "TIBC", "IRON", "RETICCTPCT" in the last 72 hours. Sepsis Labs: No results for input(s): "PROCALCITON", "LATICACIDVEN" in the last 168 hours.  Recent Results (from the past 240 hour(s))  Blood culture (single)     Status: None (Preliminary result)   Collection Time: 01/02/22 12:05 AM   Specimen: BLOOD LEFT HAND  Result Value Ref Range Status   Specimen Description BLOOD LEFT HAND  Final   Special Requests   Final    BOTTLES DRAWN AEROBIC AND ANAEROBIC Blood Culture adequate volume   Culture   Final    NO GROWTH 3 DAYS Performed at [facility], [address], [place], [place] [address]    Report Status PENDING  Incomplete  Resp Panel by RT-PCR (Flu A&B, Covid) Anterior Nasal Swab     Status: None   Collection Time: 01/05/22  4:04 AM   Specimen: Anterior Nasal Swab  Result Value Ref Range Status   SARS Coronavirus 2 by RT PCR NEGATIVE NEGATIVE Final    Comment: (NOTE) SARS-CoV-2 target nucleic acids are NOT DETECTED.  The SARS-CoV-2 RNA is generally detectable in upper respiratory specimens during the acute phase of infection. The lowest concentration of SARS-CoV-2 viral copies this assay can detect is 138 copies/mL. A negative result does not preclude SARS-Cov-2 infection and should not be used as the sole basis for treatment or other patient management decisions. A negative result may occur with  improper specimen collection/handling, submission of specimen other than nasopharyngeal swab, presence of viral mutation(s) within the areas targeted by this assay, and inadequate number of viral copies(<138 copies/mL). A negative result must be combined with clinical observations, patient history, and epidemiological information. The expected result is Negative.  Fact Sheet for Patients:  BloggerCourse.com  Fact Sheet for Healthcare Providers:  SeriousBroker.it  This test is no t yet approved or cleared by  the [place] FDA and  has been authorized for detection and/or diagnosis of SARS-CoV-2 by FDA under an Emergency Use Authorization (EUA). This EUA will remain  in effect (meaning this test can be used) for the duration of the COVID-19 declaration under Section 564(b)(1) of the Act, 21 U.S.C.section 360bbb-3(b)(1), unless the authorization is terminated  or revoked sooner.       Influenza A by PCR NEGATIVE NEGATIVE Final   Influenza B by PCR NEGATIVE NEGATIVE Final    Comment: (NOTE) The Xpert Xpress SARS-CoV-2/FLU/RSV plus assay is intended as an aid in the diagnosis of influenza from Nasopharyngeal swab specimens and should not be used as a sole basis for treatment. Nasal washings and aspirates are unacceptable for Xpert Xpress SARS-CoV-2/FLU/RSV testing.  Fact Sheet for Patients: BloggerCourse.com  Fact Sheet for Healthcare Providers: SeriousBroker.it  This test is not yet approved or cleared by the [place] FDA and has been authorized for detection and/or diagnosis of SARS-CoV-2 by FDA under an Emergency Use  Authorization (EUA). This EUA will remain in effect (meaning this test can be used) for the duration of the COVID-19 declaration under Section 564(b)(1) of the Act, 21 U.S.C. section 360bbb-3(b)(1), unless the authorization is terminated or revoked.  Performed at [facility], [address], [place], [place] [address]          Radiology Studies: CARDIAC CATHETERIZATION  Result Date: 01/06/2022   Prox RCA lesion is 20% stenosed.   Mid RCA lesion is 20% stenosed.   Dist RCA lesion is 40% stenosed.   RPAV lesion is 40% stenosed.   Prox Cx lesion is 50% stenosed.   2nd Mrg lesion is 50% stenosed.   1st Mrg lesion is 30% stenosed.   Prox LAD lesion is 50% stenosed.   1st Diag lesion is 60% stenosed.   Previously placed Prox LAD to Mid LAD stent of unknown type is  widely patent.   The left  ventricular systolic function is normal.   The left ventricular ejection fraction is 50-55% by visual estimate. 1.  Diffuse moderate nonobstructive coronary artery disease with patent stent proximal/mid LAD, 50% stenosis proximal LAD, 50% stenosis proximal left circumflex, 40% distal RCA 2.  Normal left ventricular function with estimated LVEF 50-55% Recommendations 1.  Medical therapy 2.  Resume Eliquis 01/07/2022   CT Angio Chest PE W/Cm &/Or Wo Cm  Result Date: 01/05/2022 CLINICAL DATA:  56 year old male with suspected pulmonary embolism. EXAM: CT ANGIOGRAPHY CHEST WITH CONTRAST TECHNIQUE: Multidetector CT imaging of the chest was performed using the standard protocol during bolus administration of intravenous contrast. Multiplanar CT image reconstructions and MIPs were obtained to evaluate the vascular anatomy. RADIATION DOSE REDUCTION: This exam was performed according to the departmental dose-optimization program which includes automated exposure control, adjustment of the mA and/or kV according to patient size and/or use of iterative reconstruction technique. CONTRAST:  61mL OMNIPAQUE IOHEXOL 350 MG/ML SOLN COMPARISON:  No priors. FINDINGS: Cardiovascular: There are no filling defects within the pulmonary arterial tree to suggest pulmonary embolism. Heart size is mildly enlarged. There is no significant pericardial fluid, thickening or pericardial calcification. There is aortic atherosclerosis, as well as atherosclerosis of the great vessels of the mediastinum and the coronary arteries, including calcified atherosclerotic plaque in the left main, left anterior descending, left circumflex and right coronary arteries. Mediastinum/Nodes: Multiple prominent borderline enlarged and mildly enlarged mediastinal and hilar lymph nodes are noted, with the largest mediastinal lymph node measuring up to 2 cm in short axis in the subcarinal nodal station and 1.5 cm in the prevascular nodal station. Esophagus is  unremarkable in appearance. No axillary lymphadenopathy. Lungs/Pleura: Diffuse ground-glass attenuation and widespread interlobular septal thickening is noted throughout the lungs bilaterally, suggesting a background of interstitial pulmonary edema. No confluent consolidative airspace disease. No pleural effusions. No definite suspicious appearing pulmonary nodules or masses are noted. Mild centrilobular and paraseptal emphysema predominantly in the lung apices. Upper Abdomen: Aortic atherosclerosis. Musculoskeletal: Healing nondisplaced fracture of the lateral aspect of the left sixth rib. There are no aggressive appearing lytic or blastic lesions noted in the visualized portions of the skeleton. Review of the MIP images confirms the above findings. IMPRESSION: 1. Cardiomegaly with evidence of interstitial pulmonary edema; imaging findings suggesting underlying congestive heart failure. 2. Multiple prominent borderline and mildly enlarged mediastinal and bilateral hilar lymph nodes. This is nonspecific in the setting of congestive heart failure, but follow-up contrast enhanced chest CT is recommended in 1 month after resolution of the patient's acute illness to re-evaluate  these findings and ensure regression. Lymphoproliferative disease or other malignancy is not excluded. 3. Aortic atherosclerosis, in addition to left main and three-vessel coronary artery disease. Please note that although the presence of coronary artery calcium documents the presence of coronary artery disease, the severity of this disease and any potential stenosis cannot be assessed on this non-gated CT examination. Assessment for potential risk factor modification, dietary therapy or pharmacologic therapy may be warranted, if clinically indicated. 4. Healing nondisplaced fracture of the lateral aspect of the left sixth rib. Aortic Atherosclerosis (ICD10-I70.0). Electronically Signed   By: Trudie Reed M.D.   On: 01/05/2022 05:14   DG  Chest 2 View  Result Date: 01/05/2022 CLINICAL DATA:  56 year old male with history of chest pain and shortness of breath. EXAM: CHEST - 2 VIEW COMPARISON:  Chest x-ray 01/02/2022. FINDINGS: There is cephalization of the pulmonary vasculature and slight indistinctness of the interstitial markings suggestive of mild pulmonary edema. No definite pleural effusions. No pneumothorax. Heart size is mildly enlarged. Upper mediastinal contours are within normal limits. IMPRESSION: 1. The appearance of the chest is most suggestive of congestive heart failure, as above. Electronically Signed   By: Trudie Reed M.D.   On: 01/05/2022 05:00        Scheduled Meds:  amiodarone  200 mg Oral BID   Followed by   Melene Muller ON 01/11/2022] amiodarone  200 mg Oral Daily   [START ON 01/07/2022] apixaban  5 mg Oral BID   aspirin       aspirin EC  81 mg Oral Daily   cyanocobalamin  1,000 mcg Oral Daily   FLUoxetine  10 mg Oral Daily   furosemide  40 mg Intravenous Daily   furosemide  40 mg Intravenous BID   metoprolol tartrate  50 mg Oral BID   sodium chloride flush  3 mL Intravenous Q12H   sodium chloride flush  3 mL Intravenous Q12H   sodium chloride flush  3 mL Intravenous Q12H   traZODone  100 mg Oral QHS   Continuous Infusions:  sodium chloride     sodium chloride     sodium chloride     cefTRIAXone (ROCEPHIN)  IV      Assessment & Plan:   Principal Problem:   CHF (congestive heart failure) (HCC) Active Problems:   CAD S/P percutaneous coronary angioplasty   Acute respiratory failure with hypoxia (HCC)   Tobacco use disorder   Elevated troponin   Atrial fibrillation, persistent (HCC)   Leukocytosis   Dysuria   CHF (congestive heart failure) (HCC) Acute CHF of unclear etiology 10/16 likely diastolic due to recent A-fib RVR Cath with normal EF BNP was elevated Echo pending Status post cath Cardiology following Lasix was given I/o Daily wt   Dysuria Complaining of dysuria  earlier when urinating Check UA and urine culture Start Rocephin after UA and urine culture obtained, discussed with nursing   Acute respiratory failure with hypoxia (HCC) Secondary to acute CHF Patient presented to the ER for evaluation of shortness of breath with increased work of breathing, use of accessory muscles and hypoxia with room air pulse oximetry of 86% and. He is currently on 5 L of oxygen with improvement in pulse oximetry to greater than 92% We will attempt to wean patient off oxygen as tolerated once acute illness is improved or resolved 10/16 received lasix   CAD S/P percutaneous coronary angioplasty History of coronary artery disease status post stent angioplasty CT angiogram of the chest shows aortic atherosclerosis,  in addition to left main and three-vessel coronary artery disease. Continue aspirin and metoprolol Consult cardiology   Tobacco use disorder Smoking cessation has been discussed with patient in detail He declines a nicotine transdermal patch at this time   Leukocytosis Unclear etiology and no evidence of an infectious process at this time. CT angiogram shows multiple prominent borderline and mildly enlarged mediastinal and bilateral hilar lymph nodes. This is nonspecific in the setting of congestive heart failure, but follow-up contrast enhanced chest CT is recommended in 1 month after resolution of the patient's acute illness to re-evaluate these findings and ensure regression And/16 WBC down   Atrial fibrillation, persistent (HCC) Continue amiodarone and metoprolol for rate control Continue apixaban as primary prophylaxis for an acute stroke   Elevated troponin In a patient with known coronary artery disease status post stent angioplasty We will cycle cardiac enzymes Continue aspirin and metoprolol Follow-up results of 2D echocardiogram to assess LVEF and rule out regional wall motion abnormality Status post cath   DVT prophylaxis:  Eliquis Code Status: Full Family Communication: None at bedside Disposition Plan: Home Status is: Inpatient Remains inpatient appropriate because: IV treatment        LOS: 1 day   Time spent: 35 minutes    Nolberto Hanlon, MD [facility] Pager [phone]  If 7PM-7AM, please contact night-coverage 01/06/2022, 4:32 PM

## 2022-01-06 NOTE — Progress Notes (Signed)
Brief cardiology note  Patient shortness of breath is very concerning with preserved left ventricular function Recommend discontinuing amiodarone in favor of a different antiarrhythmic if necessary Recommend advancing metoprolol for rate control continue Eliquis Consider pulmonary evaluation for patient's recent shortness of breath Consider lipid management with Repatha or Praluent since the patient states been unable to tolerate statins

## 2022-01-06 NOTE — Progress Notes (Signed)
Patient arrived to room 260 in NAD, VS stable and patient free from pain. Patient oriented to room anc call bell in reach. Patient's wife at bedside.

## 2022-01-06 NOTE — Progress Notes (Signed)
Nutrition Brief Note  RD pulled to chart secondary to CHF.   Wt Readings from Last 15 Encounters:  01/06/22 114.3 kg  01/02/22 114.3 kg   Pt with medical history significant  for CAD status post PCI with stent angioplasty in LAD in 2013, A-fib on anticoagulation, psoriasis, GERD, insomnia, nicotine dependence, who was recently discharged from the hospital after an admission for A-fib with a rapid ventricular rate.  He returned for evaluation of shortness of breath and chest pain.  Pt admitted with CHF exacerbation.   Rd provided "Low Sodium Nutrition Therapy" handout from [facility] Nutrition Care Manual"; attached to AVS/ discharge summary.   Body mass index is 34.18 kg/m. Patient meets criteria for obesity class, I based on current BMI. Reviewed wt hx. Per CareEverywhere, wt was 250# on 12/19/20.  Current diet order is 2 gram sodium, patient is consuming approximately n/a% of meals at this time. Labs and medications reviewed.   No nutrition interventions warranted at this time. If nutrition issues arise, please consult RD.   Loistine Chance, RD, LDN, [place] Registered Dietitian II Certified Diabetes Care and Education Specialist Please refer to [facility] for RD and/or RD on-call/weekend/after hours pager

## 2022-01-06 NOTE — Discharge Instructions (Signed)

## 2022-01-07 ENCOUNTER — Inpatient Hospital Stay
Admit: 2022-01-07 | Discharge: 2022-01-07 | Disposition: A | Discharge status: Home / Self Care | Payer: BC Managed Care – PPO | Attending: Internal Medicine | Admitting: Internal Medicine

## 2022-01-07 DIAGNOSIS — I2510 Atherosclerotic heart disease of native coronary artery without angina pectoris: Secondary | ICD-10-CM | POA: Diagnosis not present

## 2022-01-07 DIAGNOSIS — J9601 Acute respiratory failure with hypoxia: Secondary | ICD-10-CM | POA: Diagnosis not present

## 2022-01-07 DIAGNOSIS — I4819 Other persistent atrial fibrillation: Secondary | ICD-10-CM | POA: Diagnosis not present

## 2022-01-07 DIAGNOSIS — I509 Heart failure, unspecified: Secondary | ICD-10-CM | POA: Diagnosis not present

## 2022-01-07 LAB — BASIC METABOLIC PANEL
Anion gap: 10 (ref 5–15)
BUN: 27 mg/dL — ABNORMAL HIGH (ref 6–20)
CO2: 23 mmol/L (ref 22–32)
Calcium: 8.5 mg/dL — ABNORMAL LOW (ref 8.9–10.3)
Chloride: 104 mmol/L (ref 98–111)
Creatinine, Ser: 1.19 mg/dL (ref 0.61–1.24)
GFR, Estimated: >60 mL/min (ref >60)
Glucose, Bld: 106 mg/dL — ABNORMAL HIGH (ref 70–99)
Potassium: 3.5 mmol/L (ref 3.5–5.1)
Sodium: 137 mmol/L (ref 135–145)

## 2022-01-07 LAB — ECHOCARDIOGRAM COMPLETE
AR max vel: 2.75 cm2
AV Area VTI: 2.55 cm2
AV Area mean vel: 2.55 cm2
AV Mean grad: 3 mmHg
AV Peak grad: 5.9 mmHg
Ao pk vel: 1.21 m/s
Area-P 1/2: 2.86 cm2
Height: 72 in
S' Lateral: 2.7 cm
Weight: 4032 oz

## 2022-01-07 LAB — URINE CULTURE: Culture: NO GROWTH

## 2022-01-07 LAB — HEMOGLOBIN A1C
Hgb A1c MFr Bld: 5.2 % (ref 4.8–5.6)
Mean Plasma Glucose: 102.54 mg/dL

## 2022-01-07 MED ORDER — FUROSEMIDE 10 MG/ML IJ SOLN
40.0000 mg | Freq: Every day | INTRAMUSCULAR | Status: AC
  Administered 2022-01-07: 40 mg via INTRAVENOUS
  Filled 2022-01-07 (×2): qty 4

## 2022-01-07 MED ORDER — PERFLUTREN LIPID MICROSPHERE
1.0000 mL | INTRAVENOUS | Status: AC | PRN
  Administered 2022-01-07: 3 mL via INTRAVENOUS

## 2022-01-07 MED ORDER — FUROSEMIDE 20 MG PO TABS
20.0000 mg | ORAL_TABLET | Freq: Every day | ORAL | Status: DC
  Administered 2022-01-08: 20 mg via ORAL
  Filled 2022-01-07: qty 1

## 2022-01-07 MED ORDER — METOPROLOL TARTRATE 50 MG PO TABS
50.0000 mg | ORAL_TABLET | Freq: Two times a day (BID) | ORAL | Status: DC
  Administered 2022-01-07: 50 mg via ORAL
  Filled 2022-01-07: qty 1

## 2022-01-07 MED ORDER — METOPROLOL TARTRATE 25 MG PO TABS
25.0000 mg | ORAL_TABLET | Freq: Once | ORAL | Status: AC
  Administered 2022-01-07: 25 mg via ORAL
  Filled 2022-01-07: qty 1

## 2022-01-07 MED ORDER — ATORVASTATIN CALCIUM 20 MG PO TABS
40.0000 mg | ORAL_TABLET | Freq: Every evening | ORAL | Status: DC
  Administered 2022-01-07: 40 mg via ORAL
  Filled 2022-01-07: qty 2

## 2022-01-07 MED ORDER — POTASSIUM CHLORIDE CRYS ER 20 MEQ PO TBCR
40.0000 meq | EXTENDED_RELEASE_TABLET | Freq: Once | ORAL | Status: AC
  Administered 2022-01-07: 40 meq via ORAL
  Filled 2022-01-07: qty 2

## 2022-01-07 MED ORDER — METOPROLOL TARTRATE 25 MG PO TABS: 25.0000 mg | ORAL_TABLET | Freq: Three times a day (TID) | ORAL | Status: DC

## 2022-01-07 MED ORDER — METOPROLOL TARTRATE 25 MG PO TABS
25.0000 mg | ORAL_TABLET | Freq: Two times a day (BID) | ORAL | Status: DC
  Administered 2022-01-07: 25 mg via ORAL
  Filled 2022-01-07: qty 1

## 2022-01-07 NOTE — Progress Notes (Signed)
*  PRELIMINARY RESULTS* Echocardiogram 2D Echocardiogram has been performed.  Andres [name] 01/07/2022, 11:00 AM

## 2022-01-07 NOTE — Progress Notes (Addendum)
[facility] CLINIC CARDIOLOGY CONSULT NOTE       Patient ID: Andres [name] MRN: [id number] DOB/AGE: 1965/11/09 56 y.o.  Admit date: 01/05/2022 Referring Physician [place] Primary Physician Dr. Juanetta Gosling Primary Cardiologist Dr. Lady Gary  Reason for Consultation AoCHF  HPI: Andres [name] is a 56yoM with a PMH of CAD s/p PCI with DES to LAD 2013, paroxysmal atrial fibrillation on Eliquis, GERD, tobacco use who initially presented to [facility] ED 01/02/2022 with chest discomfort and was found to be in new onset atrial fibrillation with RVR, was treated and discharged from the ED after several hours.  He represented to [facility] ED the early morning hours of the following day with shortness of breath and chest discomfort.  He underwent left heart cath and coronary angiography on 10/16 for further evaluation of his chest pain, which revealed diffuse nonobstructive CAD with a patent proximal/mid LAD stent, 50% stenosis proximal LAD, 50% stenosis proximal LCx, 40% distal RCA, LV gram with normal LVEF estimated 50-55% for which medical therapy was recommended.  Interval history: -Felt short of breath and remained with an oxygen requirement of 3 L yesterday afternoon, has diuresed well with net -4 L since admission. -He remains in atrial fibrillation, period of RVR with rates in the 110s to 120 this morning. -Feels well, denies chest pain, feels some "congestion" in his upper chest, but denies shortness of breath or worsening peripheral edema.  Review of systems complete and found to be negative unless listed above     Past Medical History:  Diagnosis Date   A-fib (HCC)    CHF (congestive heart failure) (HCC)    Coronary artery disease    History of kidney stones     Past Surgical History:  Procedure Laterality Date   LEFT HEART CATH AND CORONARY ANGIOGRAPHY N/A 01/06/2022   Procedure: LEFT HEART CATH AND CORONARY ANGIOGRAPHY possible PCI and stent;  Surgeon: Marcina Millard, MD;  Location: [facility]  INVASIVE CV LAB;  Service: Cardiovascular;  Laterality: N/A;    Medications Prior to Admission  Medication Sig Dispense Refill Last Dose   amiodarone (PACERONE) 200 MG tablet Take 1 tablet (200 mg total) by mouth 2 (two) times daily. 60 tablet 0 01/04/2022   apixaban (ELIQUIS) 5 MG TABS tablet Take 1 tablet (5 mg total) by mouth 2 (two) times daily. 60 tablet 0 01/04/2022   aspirin EC 81 MG tablet Take 1 tablet (81 mg total) by mouth daily. Swallow whole. 30 tablet 12 01/04/2022   Cyanocobalamin 1000 MCG TBCR Take 1 tablet by mouth daily.   01/04/2022   diltiazem (CARDIZEM) 30 MG tablet Take 1 tablet (30 mg total) by mouth 3 (three) times daily as needed for up to 14 days (If HR > 100). 42 tablet 0 01/04/2022   metoprolol tartrate (LOPRESSOR) 50 MG tablet Take 1 tablet (50 mg total) by mouth 2 (two) times daily. 60 tablet 0 01/04/2022   FLUoxetine (PROZAC) 10 MG capsule Take 10 mg by mouth daily. (Patient not taking: Reported on 01/05/2022)   Not Taking   HYDROcodone-acetaminophen (NORCO/VICODIN) 5-325 MG tablet Take one tablet at night for pain; may take up to every 6 hours as needed for pain if not working or driving (Patient not taking: Reported on 01/05/2022)   Not Taking   nitroGLYCERIN (NITROSTAT) 0.4 MG SL tablet 1 TABLET UNDER TONGUE AT ONSET OF CHEST PAIN. REPEAT IN 5 MIN IF NOT RESOLVED, MAX 3 DOSES. 911 IF NEEDED.      traZODone (DESYREL) 100 MG tablet  Take 1 tablet by mouth at bedtime. (Patient not taking: Reported on 01/05/2022)   Not Taking   Social History   Socioeconomic History   Marital status: Married    Spouse name: Not on file   Number of children: Not on file   Years of education: Not on file   Highest education level: Not on file  Occupational History   Not on file  Tobacco Use   Smoking status: Every Day    Packs/day: 1.00    Types: Cigarettes   Smokeless tobacco: Not on file  Substance and Sexual Activity   Alcohol use: Not Currently   Drug use: Never    Sexual activity: Not on file  Other Topics Concern   Not on file  Social History Narrative   Not on file   Social Determinants of Health   Financial Resource Strain: Not on file  Food Insecurity: No Food Insecurity (01/06/2022)   Hunger Vital Sign    Worried About Running Out of Food in the Last Year: Never true    Ran Out of Food in the Last Year: Never true  Transportation Needs: No Transportation Needs (01/06/2022)   PRAPARE - Administrator, Civil ServiceTransportation    Lack of Transportation (Medical): No    Lack of Transportation (Non-Medical): No  Physical Activity: Not on file  Stress: Not on file  Social Connections: Not on file  Intimate Partner Violence: Not At Risk (01/06/2022)   Humiliation, Afraid, Rape, and Kick questionnaire    Fear of Current or Ex-Partner: No    Emotionally Abused: No    Physically Abused: No    Sexually Abused: No    History reviewed. No pertinent family history.   Vitals:   01/06/22 2035 01/06/22 2039 01/07/22 0332 01/07/22 0722  BP:  100/67 128/83 124/87  Pulse:  88 87 93  Resp:  18  18  Temp:  98 F (36.7 C) 97.6 F (36.4 C) (!) 97.5 F (36.4 C)  TempSrc:   Oral   SpO2: 97% 98% 98% 97%  Weight:      Height:        PHYSICAL EXAM General: Pleasant middle-age Caucasian male, well nourished, in no acute distress. HEENT:  Normocephalic and atraumatic. Neck:  No JVD.  Lungs: Normal respiratory effort on room air.  Decreased breath sounds without appreciable crackles or wheezes Heart: Irregularly irregular with controlled rate. Normal S1 and S2 without gallops or murmurs.  Abdomen: Non-distended appearing with excess adiposity.  Msk: Normal strength and tone for age. Extremities: Warm and well perfused. No clubbing, cyanosis.  No peripheral edema.  Arteriotomy sites on right and left wrists are with mild tenderness to palpation, trace edema on his right hand, remains neurovascularly intact and 2+ right radial pulse.  Gauze and Tegaderms in place bilaterally  without active bleeding. Neuro: Alert and oriented X 3. Psych:  Answers questions appropriately.   Labs: Basic Metabolic Panel: Recent Labs    01/05/22 2013 01/06/22 0532 01/07/22 0342  NA 138 137 137  K 3.9 3.8 3.5  CL 102 103 104  CO2 27 26 23   GLUCOSE 104* 114* 106*  BUN 20 23* 27*  CREATININE 1.14 1.17 1.19  CALCIUM 9.1 8.7* 8.5*  MG 2.2  --   --    Liver Function Tests: No results for input(s): "AST", "ALT", "ALKPHOS", "BILITOT", "PROT", "ALBUMIN" in the last 72 hours. No results for input(s): "LIPASE", "AMYLASE" in the last 72 hours. CBC: Recent Labs    01/05/22 0400 01/06/22 0532  WBC 18.7* 11.9*  HGB 15.9 13.7  HCT 45.3 39.9  MCV 86.0 86.0  PLT 297 251   Cardiac Enzymes: Recent Labs    01/05/22 0400 01/05/22 0528  TROPONINIHS 228* 297*   BNP: Recent Labs    01/05/22 0400  BNP 858.3*   D-Dimer: No results for input(s): "DDIMER" in the last 72 hours. Hemoglobin A1C: No results for input(s): "HGBA1C" in the last 72 hours. Fasting Lipid Panel: No results for input(s): "CHOL", "HDL", "LDLCALC", "TRIG", "CHOLHDL", "LDLDIRECT" in the last 72 hours. Thyroid Function Tests: Recent Labs    01/05/22 0400  TSH 8.919*   Anemia Panel: No results for input(s): "VITAMINB12", "FOLATE", "FERRITIN", "TIBC", "IRON", "RETICCTPCT" in the last 72 hours.   Radiology: CARDIAC CATHETERIZATION  Result Date: 01/06/2022   Prox RCA lesion is 20% stenosed.   Mid RCA lesion is 20% stenosed.   Dist RCA lesion is 40% stenosed.   RPAV lesion is 40% stenosed.   Prox Cx lesion is 50% stenosed.   2nd Mrg lesion is 50% stenosed.   1st Mrg lesion is 30% stenosed.   Prox LAD lesion is 50% stenosed.   1st Diag lesion is 60% stenosed.   Previously placed Prox LAD to Mid LAD stent of unknown type is  widely patent.   The left ventricular systolic function is normal.   The left ventricular ejection fraction is 50-55% by visual estimate. 1.  Diffuse moderate nonobstructive coronary artery  disease with patent stent proximal/mid LAD, 50% stenosis proximal LAD, 50% stenosis proximal left circumflex, 40% distal RCA 2.  Normal left ventricular function with estimated LVEF 50-55% Recommendations 1.  Medical therapy 2.  Resume Eliquis 01/07/2022   CT Angio Chest PE W/Cm &/Or Wo Cm  Result Date: 01/05/2022 CLINICAL DATA:  56 year old male with suspected pulmonary embolism. EXAM: CT ANGIOGRAPHY CHEST WITH CONTRAST TECHNIQUE: Multidetector CT imaging of the chest was performed using the standard protocol during bolus administration of intravenous contrast. Multiplanar CT image reconstructions and MIPs were obtained to evaluate the vascular anatomy. RADIATION DOSE REDUCTION: This exam was performed according to the departmental dose-optimization program which includes automated exposure control, adjustment of the mA and/or kV according to patient size and/or use of iterative reconstruction technique. CONTRAST:  52mL OMNIPAQUE IOHEXOL 350 MG/ML SOLN COMPARISON:  No priors. FINDINGS: Cardiovascular: There are no filling defects within the pulmonary arterial tree to suggest pulmonary embolism. Heart size is mildly enlarged. There is no significant pericardial fluid, thickening or pericardial calcification. There is aortic atherosclerosis, as well as atherosclerosis of the great vessels of the mediastinum and the coronary arteries, including calcified atherosclerotic plaque in the left main, left anterior descending, left circumflex and right coronary arteries. Mediastinum/Nodes: Multiple prominent borderline enlarged and mildly enlarged mediastinal and hilar lymph nodes are noted, with the largest mediastinal lymph node measuring up to 2 cm in short axis in the subcarinal nodal station and 1.5 cm in the prevascular nodal station. Esophagus is unremarkable in appearance. No axillary lymphadenopathy. Lungs/Pleura: Diffuse ground-glass attenuation and widespread interlobular septal thickening is noted throughout  the lungs bilaterally, suggesting a background of interstitial pulmonary edema. No confluent consolidative airspace disease. No pleural effusions. No definite suspicious appearing pulmonary nodules or masses are noted. Mild centrilobular and paraseptal emphysema predominantly in the lung apices. Upper Abdomen: Aortic atherosclerosis. Musculoskeletal: Healing nondisplaced fracture of the lateral aspect of the left sixth rib. There are no aggressive appearing lytic or blastic lesions noted in the visualized portions of the skeleton. Review of the  MIP images confirms the above findings. IMPRESSION: 1. Cardiomegaly with evidence of interstitial pulmonary edema; imaging findings suggesting underlying congestive heart failure. 2. Multiple prominent borderline and mildly enlarged mediastinal and bilateral hilar lymph nodes. This is nonspecific in the setting of congestive heart failure, but follow-up contrast enhanced chest CT is recommended in 1 month after resolution of the patient's acute illness to re-evaluate these findings and ensure regression. Lymphoproliferative disease or other malignancy is not excluded. 3. Aortic atherosclerosis, in addition to left main and three-vessel coronary artery disease. Please note that although the presence of coronary artery calcium documents the presence of coronary artery disease, the severity of this disease and any potential stenosis cannot be assessed on this non-gated CT examination. Assessment for potential risk factor modification, dietary therapy or pharmacologic therapy may be warranted, if clinically indicated. 4. Healing nondisplaced fracture of the lateral aspect of the left sixth rib. Aortic Atherosclerosis (ICD10-I70.0). Electronically Signed   By: Trudie Reed M.D.   On: 01/05/2022 05:14   DG Chest 2 View  Result Date: 01/05/2022 CLINICAL DATA:  55 year old male with history of chest pain and shortness of breath. EXAM: CHEST - 2 VIEW COMPARISON:  Chest x-ray  01/02/2022. FINDINGS: There is cephalization of the pulmonary vasculature and slight indistinctness of the interstitial markings suggestive of mild pulmonary edema. No definite pleural effusions. No pneumothorax. Heart size is mildly enlarged. Upper mediastinal contours are within normal limits. IMPRESSION: 1. The appearance of the chest is most suggestive of congestive heart failure, as above. Electronically Signed   By: Trudie Reed M.D.   On: 01/05/2022 05:00   DG Chest Port 1 View  Result Date: 01/03/2022 CLINICAL DATA:  Chest pain EXAM: PORTABLE CHEST 1 VIEW COMPARISON:  01/24/2020 FINDINGS: Lungs are clear.  No pleural effusion or pneumothorax. The heart is normal in size. IMPRESSION: No evidence of acute cardiopulmonary disease. Electronically Signed   By: Charline Bills M.D.   On: 01/03/2022 00:02    ECHO 07/2013 greater than 55%  TELEMETRY reviewed by me (LT) 01/07/2022 : Atrial fibrillation rate 85 to 90s, period of RVR around 730 at 115-125  EKG reviewed by me: Atrial flutter with variable conduction rate 88  Data reviewed by me (LT) 01/07/2022: ED note, admission H&P, hospitalist progress note, prior echo, CBC, BMP, troponins, BNP, vitals, telemetry  Principal Problem:   CHF (congestive heart failure) (HCC) Active Problems:   CAD S/P percutaneous coronary angioplasty   Tobacco use disorder   Elevated troponin   Acute respiratory failure with hypoxia (HCC)   Atrial fibrillation, persistent (HCC)   Leukocytosis   Dysuria    ASSESSMENT AND PLAN:  Andres [name] is a 56yoM with a PMH of CAD s/p PCI with DES to LAD 2013, paroxysmal atrial fibrillation on Eliquis, GERD, tobacco use who initially presented to [facility] ED 01/02/2022 with chest discomfort and was found to be in new onset atrial fibrillation with RVR, was treated and discharged from the ED after several hours.  He represented to [facility] ED the early morning hours of the following day with shortness of breath and chest  discomfort.  He underwent left heart cath and coronary angiography on 10/16 for further evaluation of his chest pain, which revealed diffuse nonobstructive CAD with a patent proximal/mid LAD stent, 50% stenosis proximal LAD, 50% stenosis proximal LCx, 40% distal RCA, LV gram with normal LVEF estimated 50-55% for which medical therapy was recommended.  #CAD with chest pain #Elevated troponins Troponin this admission with a flat  trend at [phone], [facility] 10/16 revealed diffuse nonobstructive CAD for which medical therapy was recommended.  Likely elevated in the setting of demand/supply mismatch from tachycardia and volume overload and not ACS. -Continue aspirin 81 mg daily -He has a statin allergy listed, but when discussed with patient, he does not ever remember being on a statin (Lipitor, Crestor), he is agreeable to try atorvastatin 40 mg once daily -May still need Praluent or Repatha for optimal management, to be done on an outpatient basis.  #Paroxysmal atrial fibrillation with RVR Mostly rate controlled in the 90s to low 100s.  Suspect most of his chest discomfort is related to periods of RVR causing his decompensation/fluid retention. -Continue amiodarone load with 200 mg twice daily x5 days, then 200 mg daily thereafter -Continue metoprolol tartrate 25 mg twice daily for rate control -Resume Eliquis for stroke risk reduction today.  CHA2DS2-VASc (HTN, CAD).  After 3 weeks of appropriate anticoagulation, can consider cardioversion if he remains in atrial fibrillation.  #Acute on chronic HFpEF BNP on admission elevated at 800, has diuresed well since admission with net -4 L on 10/17. -Appears euvolemic on exam 10/17, is comfortable on room air. -Will discharge on Lasix 20 mg once daily -Discussed salt and fluid restriction with patient -Ordered echocardiogram complete, to be done prior to discharge today.  #Tobacco abuse Discussed in detail complete smoking cessation, he is motivated to  quit.  He is okay for discharge today from a cardiac standpoint, pending echocardiogram prior to discharge.  Follow-up with Dr. Clayborn Bigness in 1 to 2 weeks.  This patient's plan of care was discussed and created with Dr. Nehemiah Massed and he is in agreement.  Signed: Tristan Schroeder , PA-C 01/07/2022, 8:05 AM [facility] Cardiology

## 2022-01-07 NOTE — Consult Note (Addendum)
   Heart Failure Nurse Navigator Note  HFpEF 65 to 70%.  Mild left ventricular diastolic parameters.  Right ventricular systolic function was normal.  Left atrium mildly dilated.  Moderate mitral regurgitation.  He presented to the emergency room with complaints of shortness of breath and chest pain.  Chest x-ray revealed mild pulmonary edema and cardiomegaly.  BNP was 853.  Comorbidities:  Coronary artery disease with stenting to the LAD Atrial fibrillation GERD Continued tobacco abuse  Medications:  Amiodarone 200 mg 2 times a day Apixaban 5 mg 2 times a day aspirin 81 mg daily Metoprolol tartrate 25 mg once  Future medications: Atorvastatin 40 mg in the evening Furosemide 20 mg orally once a day Metoprolol tartrate 50 mg 2 times a day  Labs:  Hemoglobin A1c 5.2, sodium 137, potassium 3.5, chloride 104, CO2 23, BUN 27, creatinine 1.19, estimated GFR greater than 60. Weight not documented Blood pressure 112/83 Intake 120 mL Output 1950 mL   Initial meeting with patient he was lying in bed in no acute distress and wife who was at the bedside.  Discussed heart failure, patient states that his nurse Crystal last night had started his heart failure education and felt that she had filled and a lot of the blanks.  Went over the 2 types of heart failure and what it means.  Patient states that he has not used salt at the table for many many years.  Discussed salt and sodium content in foods.  And what to avoid.  He states that they have a garden at home and raise a lot of their own foods, very rarely do they eat in restaurants and he does not like fast foods.  He states that he does like to get a salad when he is out, discussed rather than putting the dressing on the salad directly to have the dressing  on the side and to dip his food into it as he would get last dressing that way.  Also discussed the importance of fluid restriction.  He was given the living with heart failure  teaching booklet, zone magnet, information on low-sodium and heart failure along with weight chart.  They had no further questions but we will continue to follow along.    Discussed reporting changes in heart failure symptoms such as increased weight, PND, orthopnea, shortness of breath on exertion and lower extremity edema etc. voiced understanding.  Pricilla Riffle RN [facility]

## 2022-01-07 NOTE — Progress Notes (Signed)
PROGRESS NOTE    Andres [name]  GNF:[id number] DOB: Apr 03, 1965 DOA: 01/05/2022 PCP: [facility]    Brief Narrative:  Andres [name] is a 56 y.o. male with medical history significant  for CAD status post PCI with stent angioplasty in LAD in 2013, A-fib on anticoagulation, psoriasis, GERD, insomnia, nicotine dependence, who was recently discharged from the hospital after an admission for A-fib with a rapid ventricular rate.  He was discharged home in stable condition on amiodarone, metoprolol and Eliquis and return to the emergency room in the early hours of the morning for evaluation of shortness of breath and chest pain. Patient presented to the ER via private vehicle for evaluation of sudden onset shortness of breath associated with midsternal chest tightness.  He denied having any radiation of the tightness, no nausea, no vomiting, no diaphoresis, no palpitations, no dizziness, no cough, no fever, no chills, no changes in his bowel habits, no leg swelling, no blurred vision no focal deficit. Patient was hypoxic upon arrival to the ER, he had increased work of breathing and room air pulse oximetry of 86% requiring oxygen supplementation at 5 L to maintain pulse oximetry greater than 92%. Chest x-ray was suggestive of acute CHF and his BNP was elevated He received Lasix 40 mg IV in the ER with improvement in his symptoms and will be admitted to the hospital for further evaluation.  10/17 HR variable , in 100's, 110's. Feels better. Tele with 5beat wide complex tachy, cardiology made aware  Consultants:  cardiology  Procedures: cath1.  Diffuse moderate nonobstructive coronary artery disease with patent stent proximal/mid LAD, 50% stenosis proximal LAD, 50% stenosis proximal left circumflex, 40% distal RCA 2.  Normal left ventricular function with estimated LVEF 50-55%  Antimicrobials:      Subjective: Less sob, no cp or dizziness at rest. Has not  ambulated.    Objective: Vitals:   01/06/22 2035 01/06/22 2039 01/07/22 0332 01/07/22 0722  BP:  100/67 128/83 124/87  Pulse:  88 87 93  Resp:  18  18  Temp:  98 F (36.7 C) 97.6 F (36.4 C) (!) 97.5 F (36.4 C)  TempSrc:   Oral   SpO2: 97% 98% 98% 97%  Weight:      Height:        Intake/Output Summary (Last 24 hours) at 01/07/2022 0839 Last data filed at 01/07/2022 0335 Gross per 24 hour  Intake 120 ml  Output 1925 ml  Net -1805 ml   Filed Weights   01/05/22 0347 01/06/22 0713  Weight: 113.4 kg 114.3 kg    Examination: Calm, NAD Decrease bs,no wheezing Reg s1/s2 no gallop Soft benign +bs Trace le edema Aaoxox3  Mood and affect appropriate in current setting     Data Reviewed: I have personally reviewed following labs and imaging studies  CBC: Recent Labs  Lab 01/02/22 2337 01/05/22 0400 01/06/22 0532  WBC 9.7 18.7* 11.9*  HGB 15.2 15.9 13.7  HCT 44.0 45.3 39.9  MCV 87.0 86.0 86.0  PLT 235 297 846   Basic Metabolic Panel: Recent Labs  Lab 01/02/22 2337 01/05/22 0400 01/05/22 2013 01/06/22 0532 01/07/22 0342  NA 138 138 138 137 137  K 3.5 3.7 3.9 3.8 3.5  CL 105 109 102 103 104  CO2 23 21* 27 26 23   GLUCOSE 171* 101* 104* 114* 106*  BUN 16 16 20  23* 27*  CREATININE 1.13 0.99 1.14 1.17 1.19  CALCIUM 9.1 8.7* 9.1 8.7* 8.5*  MG  --   --  2.2  --   --    GFR: Estimated Creatinine Clearance: 89.4 mL/min (by C-G formula based on SCr of 1.19 mg/dL). Liver Function Tests: No results for input(s): "AST", "ALT", "ALKPHOS", "BILITOT", "PROT", "ALBUMIN" in the last 168 hours. No results for input(s): "LIPASE", "AMYLASE" in the last 168 hours. No results for input(s): "AMMONIA" in the last 168 hours. Coagulation Profile: No results for input(s): "INR", "PROTIME" in the last 168 hours. Cardiac Enzymes: No results for input(s): "CKTOTAL", "CKMB", "CKMBINDEX", "TROPONINI" in the last 168 hours. BNP (last 3 results) No results for input(s): "PROBNP"  in the last 8760 hours. HbA1C: No results for input(s): "HGBA1C" in the last 72 hours. CBG: No results for input(s): "GLUCAP" in the last 168 hours. Lipid Profile: No results for input(s): "CHOL", "HDL", "LDLCALC", "TRIG", "CHOLHDL", "LDLDIRECT" in the last 72 hours. Thyroid Function Tests: Recent Labs    01/05/22 0400  TSH 8.919*  FREET4 0.74   Anemia Panel: No results for input(s): "VITAMINB12", "FOLATE", "FERRITIN", "TIBC", "IRON", "RETICCTPCT" in the last 72 hours. Sepsis Labs: No results for input(s): "PROCALCITON", "LATICACIDVEN" in the last 168 hours.  Recent Results (from the past 240 hour(s))  Blood culture (single)     Status: None (Preliminary result)   Collection Time: 01/02/22 12:05 AM   Specimen: BLOOD LEFT HAND  Result Value Ref Range Status   Specimen Description BLOOD LEFT HAND  Final   Special Requests   Final    BOTTLES DRAWN AEROBIC AND ANAEROBIC Blood Culture adequate volume   Culture   Final    NO GROWTH 3 DAYS Performed at [facility], [address] [place], [place] [address]    Report Status PENDING  Incomplete  Resp Panel by RT-PCR (Flu A&B, Covid) Anterior Nasal Swab     Status: None   Collection Time: 01/05/22  4:04 AM   Specimen: Anterior Nasal Swab  Result Value Ref Range Status   SARS Coronavirus 2 by RT PCR NEGATIVE NEGATIVE Final    Comment: (NOTE) SARS-CoV-2 target nucleic acids are NOT DETECTED.  The SARS-CoV-2 RNA is generally detectable in upper respiratory specimens during the acute phase of infection. The lowest concentration of SARS-CoV-2 viral copies this assay can detect is 138 copies/mL. A negative result does not preclude SARS-Cov-2 infection and should not be used as the sole basis for treatment or other patient management decisions. A negative result may occur with  improper specimen collection/handling, submission of specimen other than nasopharyngeal swab, presence of viral mutation(s) within the areas  targeted by this assay, and inadequate number of viral copies(<138 copies/mL). A negative result must be combined with clinical observations, patient history, and epidemiological information. The expected result is Negative.  Fact Sheet for Patients:  BloggerCourse.com  Fact Sheet for Healthcare Providers:  SeriousBroker.it  This test is no t yet approved or cleared by the [place] FDA and  has been authorized for detection and/or diagnosis of SARS-CoV-2 by FDA under an Emergency Use Authorization (EUA). This EUA will remain  in effect (meaning this test can be used) for the duration of the COVID-19 declaration under Section 564(b)(1) of the Act, 21 U.S.C.section 360bbb-3(b)(1), unless the authorization is terminated  or revoked sooner.       Influenza A by PCR NEGATIVE NEGATIVE Final   Influenza B by PCR NEGATIVE NEGATIVE Final    Comment: (NOTE) The Xpert Xpress SARS-CoV-2/FLU/RSV plus assay is intended as an aid in the diagnosis of influenza from Nasopharyngeal swab specimens and should not  be used as a sole basis for treatment. Nasal washings and aspirates are unacceptable for Xpert Xpress SARS-CoV-2/FLU/RSV testing.  Fact Sheet for Patients: BloggerCourse.com  Fact Sheet for Healthcare Providers: SeriousBroker.it  This test is not yet approved or cleared by the [place] FDA and has been authorized for detection and/or diagnosis of SARS-CoV-2 by FDA under an Emergency Use Authorization (EUA). This EUA will remain in effect (meaning this test can be used) for the duration of the COVID-19 declaration under Section 564(b)(1) of the Act, 21 U.S.C. section 360bbb-3(b)(1), unless the authorization is terminated or revoked.  Performed at [facility], [address] [place], [place] [address]          Radiology Studies: CARDIAC  CATHETERIZATION  Result Date: 01/06/2022   Prox RCA lesion is 20% stenosed.   Mid RCA lesion is 20% stenosed.   Dist RCA lesion is 40% stenosed.   RPAV lesion is 40% stenosed.   Prox Cx lesion is 50% stenosed.   2nd Mrg lesion is 50% stenosed.   1st Mrg lesion is 30% stenosed.   Prox LAD lesion is 50% stenosed.   1st Diag lesion is 60% stenosed.   Previously placed Prox LAD to Mid LAD stent of unknown type is  widely patent.   The left ventricular systolic function is normal.   The left ventricular ejection fraction is 50-55% by visual estimate. 1.  Diffuse moderate nonobstructive coronary artery disease with patent stent proximal/mid LAD, 50% stenosis proximal LAD, 50% stenosis proximal left circumflex, 40% distal RCA 2.  Normal left ventricular function with estimated LVEF 50-55% Recommendations 1.  Medical therapy 2.  Resume Eliquis 01/07/2022        Scheduled Meds:  amiodarone  200 mg Oral BID   Followed by   Melene Muller ON 01/11/2022] amiodarone  200 mg Oral Daily   apixaban  5 mg Oral BID   aspirin EC  81 mg Oral Daily   cyanocobalamin  1,000 mcg Oral Daily   FLUoxetine  10 mg Oral Daily   furosemide  40 mg Intravenous Daily   furosemide  40 mg Intravenous BID   metoprolol tartrate  25 mg Oral TID   sodium chloride flush  3 mL Intravenous Q12H   sodium chloride flush  3 mL Intravenous Q12H   sodium chloride flush  3 mL Intravenous Q12H   traZODone  100 mg Oral QHS   Continuous Infusions:  sodium chloride     sodium chloride     cefTRIAXone (ROCEPHIN)  IV 1 g (01/06/22 1736)    Assessment & Plan:   Principal Problem:   CHF (congestive heart failure) (HCC) Active Problems:   CAD S/P percutaneous coronary angioplasty   Acute respiratory failure with hypoxia (HCC)   Tobacco use disorder   Elevated troponin   Atrial fibrillation, persistent (HCC)   Leukocytosis   Dysuria   CHF (congestive heart failure) (HCC) Acute CHF of unclear etiology 10/16 likely diastolic due to  recent A-fib RVR Cath with normal EF BNP was elevated Status post cath- nonobstructive dz. See above 10/17 echo pending Cards following 5beat wct Still need to continue iv lasix I/o Daily wt    Dysuria Complaining of dysuria earlier when urinating Check UA and urine culture 10/17 urine culture pending Continue Rocephin   Acute respiratory failure with hypoxia (HCC) Secondary to acute CHF Patient presented to the ER for evaluation of shortness of breath with increased work of breathing, use of accessory muscles and hypoxia with room  air pulse oximetry of 86% and. He is currently on 5 L of oxygen with improvement in pulse oximetry to greater than 92% We will attempt to wean patient off oxygen as tolerated once acute illness is improved or resolved 10/17 continue Lasix we will ambulate patient to see what his ambulating O2 sat on room air with    CAD S/P percutaneous coronary angioplasty History of coronary artery disease status post stent angioplasty CT angiogram of the chest shows aortic atherosclerosis, in addition to left main and three-vessel coronary artery disease. Continue aspirin and metoprolol Status post cath report above   Tobacco use disorder Smoking cessation has been discussed with patient in detail He declines a nicotine transdermal patch at this time   Leukocytosis Unclear etiology and no evidence of an infectious process at this time. CT angiogram shows multiple prominent borderline and mildly enlarged mediastinal and bilateral hilar lymph nodes. This is nonspecific in the setting of congestive heart failure, but follow-up contrast enhanced chest CT is recommended in 1 month after resolution of the patient's acute illness to re-evaluate these findings and ensure regression Wbc down   Atrial fibrillation, persistent (HCC) Continue amiodarone and metoprolol for rate control Continue apixaban as primary prophylaxis for an acute stroke 10/17 HR still not well  control, and will push him into chf. D/w cardiology.    Elevated troponin In a patient with known coronary artery disease status post stent angioplasty We will cycle cardiac enzymes Continue aspirin and metoprolol Follow-up results of 2D echocardiogram to assess LVEF and rule out regional wall motion abnormality Status post cath   DVT prophylaxis: Eliquis Code Status: Full Family Communication: None at bedside Disposition Plan: Home Status is: Inpatient Remains inpatient appropriate because: IV treatment, needs better HR control. Echo pending.        LOS: 2 days   Time spent: 35 minutes    Lynn Ito, MD [facility] Pager [phone]  If 7PM-7AM, please contact night-coverage 01/07/2022, 8:39 AM

## 2022-01-08 DIAGNOSIS — R0602 Shortness of breath: Secondary | ICD-10-CM | POA: Diagnosis not present

## 2022-01-08 DIAGNOSIS — I4819 Other persistent atrial fibrillation: Secondary | ICD-10-CM | POA: Diagnosis not present

## 2022-01-08 DIAGNOSIS — R0902 Hypoxemia: Secondary | ICD-10-CM

## 2022-01-08 DIAGNOSIS — R079 Chest pain, unspecified: Secondary | ICD-10-CM

## 2022-01-08 DIAGNOSIS — F17200 Nicotine dependence, unspecified, uncomplicated: Secondary | ICD-10-CM

## 2022-01-08 DIAGNOSIS — R7989 Other specified abnormal findings of blood chemistry: Secondary | ICD-10-CM

## 2022-01-08 DIAGNOSIS — I2510 Atherosclerotic heart disease of native coronary artery without angina pectoris: Secondary | ICD-10-CM | POA: Diagnosis not present

## 2022-01-08 DIAGNOSIS — I509 Heart failure, unspecified: Secondary | ICD-10-CM | POA: Diagnosis not present

## 2022-01-08 LAB — MAGNESIUM: Magnesium: 2.1 mg/dL (ref 1.7–2.4)

## 2022-01-08 LAB — CULTURE, BLOOD (SINGLE)
Culture: NO GROWTH
Special Requests: ADEQUATE

## 2022-01-08 LAB — POTASSIUM: Potassium: 3.6 mmol/L (ref 3.5–5.1)

## 2022-01-08 LAB — LIPOPROTEIN A (LPA): Lipoprotein (a): 19.7 nmol/L (ref <75.0)

## 2022-01-08 MED ORDER — ATORVASTATIN CALCIUM 40 MG PO TABS: 40.0000 mg | ORAL_TABLET | Freq: Every evening | ORAL | 2 refills | Status: DC

## 2022-01-08 MED ORDER — METOPROLOL TARTRATE 50 MG PO TABS
75.0000 mg | ORAL_TABLET | Freq: Two times a day (BID) | ORAL | Status: DC
  Administered 2022-01-08: 75 mg via ORAL
  Filled 2022-01-08: qty 1

## 2022-01-08 MED ORDER — POTASSIUM CHLORIDE CRYS ER 20 MEQ PO TBCR
40.0000 meq | EXTENDED_RELEASE_TABLET | Freq: Once | ORAL | Status: AC
  Administered 2022-01-08: 40 meq via ORAL
  Filled 2022-01-08: qty 2

## 2022-01-08 MED ORDER — METOPROLOL TARTRATE 75 MG PO TABS: 75.0000 mg | ORAL_TABLET | Freq: Two times a day (BID) | ORAL | 1 refills | Status: DC

## 2022-01-08 MED ORDER — AMIODARONE HCL 200 MG PO TABS: 200.0000 mg | ORAL_TABLET | Freq: Two times a day (BID) | ORAL | 0 refills | Status: DC

## 2022-01-08 MED ORDER — FUROSEMIDE 20 MG PO TABS: 20.0000 mg | ORAL_TABLET | Freq: Every day | ORAL | 1 refills | Status: DC

## 2022-01-08 NOTE — Progress Notes (Signed)
   Heart Failure Nurse Navigator Note  Met with patient and his wife who was at the bedside.  Answered their questions that they had about the fluid restriction and diet.  Also answered their questions about the amiodarone and metoprolol that he is taking.  Supplied patient with scale for weighing himself, weekly med box and a  for bringing his medications to his doctors appointments.  He had no further questions.  Pricilla Riffle RN [facility]

## 2022-01-08 NOTE — Progress Notes (Signed)
[facility] CLINIC CARDIOLOGY CONSULT NOTE       Patient ID: Andres [name] MRN: [id number] DOB/AGE: [birth year] 56 y.o.  Admit date: 01/05/2022 Referring Physician [place] Primary Physician Dr. Juanetta Gosling Primary Cardiologist Dr. Lady Gary  Reason for Consultation AoCHF  HPI: Andres [name] is a 56yoM with a PMH of CAD s/p PCI with DES to LAD 2013, paroxysmal atrial fibrillation on Eliquis, GERD, tobacco use who initially presented to [facility] ED 01/02/2022 with chest discomfort and was found to be in new onset atrial fibrillation with RVR, was treated and discharged from the ED after several hours.  He represented to [facility] ED the early morning hours of the following day with shortness of breath and chest discomfort.  He underwent left heart cath and coronary angiography on 10/16 for further evaluation of his chest pain, which revealed diffuse nonobstructive CAD with a patent proximal/mid LAD stent, 50% stenosis proximal LAD, 50% stenosis proximal LCx, 40% distal RCA, LV gram with normal LVEF estimated 50-55% for which medical therapy was recommended.  Interval history: -feels good today, on room air, denies chest pain or shortness of breath -ambulated with the patient 1 lap around the floor and he did well without palpitations or shortness of breath  -echo resulted with preserved LVEF, moderate MR   Review of systems complete and found to be negative unless listed above     Past Medical History:  Diagnosis Date   A-fib (HCC)    CHF (congestive heart failure) (HCC)    Coronary artery disease    History of kidney stones     Past Surgical History:  Procedure Laterality Date   LEFT HEART CATH AND CORONARY ANGIOGRAPHY N/A 01/06/2022   Procedure: LEFT HEART CATH AND CORONARY ANGIOGRAPHY possible PCI and stent;  Surgeon: Marcina Millard, MD;  Location: [facility] INVASIVE CV LAB;  Service: Cardiovascular;  Laterality: N/A;    Medications Prior to Admission  Medication Sig Dispense Refill Last Dose    amiodarone (PACERONE) 200 MG tablet Take 1 tablet (200 mg total) by mouth 2 (two) times daily. 60 tablet 0 01/04/2022   apixaban (ELIQUIS) 5 MG TABS tablet Take 1 tablet (5 mg total) by mouth 2 (two) times daily. 60 tablet 0 01/04/2022   aspirin EC 81 MG tablet Take 1 tablet (81 mg total) by mouth daily. Swallow whole. 30 tablet 12 01/04/2022   Cyanocobalamin 1000 MCG TBCR Take 1 tablet by mouth daily.   01/04/2022   diltiazem (CARDIZEM) 30 MG tablet Take 1 tablet (30 mg total) by mouth 3 (three) times daily as needed for up to 14 days (If HR > 100). 42 tablet 0 01/04/2022   metoprolol tartrate (LOPRESSOR) 50 MG tablet Take 1 tablet (50 mg total) by mouth 2 (two) times daily. 60 tablet 0 01/04/2022   FLUoxetine (PROZAC) 10 MG capsule Take 10 mg by mouth daily. (Patient not taking: Reported on 01/05/2022)   Not Taking   HYDROcodone-acetaminophen (NORCO/VICODIN) 5-325 MG tablet Take one tablet at night for pain; may take up to every 6 hours as needed for pain if not working or driving (Patient not taking: Reported on 01/05/2022)   Not Taking   nitroGLYCERIN (NITROSTAT) 0.4 MG SL tablet 1 TABLET UNDER TONGUE AT ONSET OF CHEST PAIN. REPEAT IN 5 MIN IF NOT RESOLVED, MAX 3 DOSES. 911 IF NEEDED.      traZODone (DESYREL) 100 MG tablet Take 1 tablet by mouth at bedtime. (Patient not taking: Reported on 01/05/2022)   Not Taking   Social History  Socioeconomic History   Marital status: Married    Spouse name: Not on file   Number of children: Not on file   Years of education: Not on file   Highest education level: Not on file  Occupational History   Not on file  Tobacco Use   Smoking status: Every Day    Packs/day: 1.00    Types: Cigarettes   Smokeless tobacco: Not on file  Substance and Sexual Activity   Alcohol use: Not Currently   Drug use: Never   Sexual activity: Not on file  Other Topics Concern   Not on file  Social History Narrative   Not on file   Social Determinants of Health    Financial Resource Strain: Not on file  Food Insecurity: No Food Insecurity (01/06/2022)   Hunger Vital Sign    Worried About Running Out of Food in the Last Year: Never true    Ran Out of Food in the Last Year: Never true  Transportation Needs: No Transportation Needs (01/06/2022)   PRAPARE - Administrator, Civil ServiceTransportation    Lack of Transportation (Medical): No    Lack of Transportation (Non-Medical): No  Physical Activity: Not on file  Stress: Not on file  Social Connections: Not on file  Intimate Partner Violence: Not At Risk (01/06/2022)   Humiliation, Afraid, Rape, and Kick questionnaire    Fear of Current or Ex-Partner: No    Emotionally Abused: No    Physically Abused: No    Sexually Abused: No    History reviewed. No pertinent family history.   Vitals:   01/07/22 2349 01/08/22 0525 01/08/22 0541 01/08/22 0818  BP: (!) 123/108 109/87  113/76  Pulse: 88 76  95  Resp: 18 18  18   Temp: 98.7 F (37.1 C) (!) 97.5 F (36.4 C)  (!) 97.5 F (36.4 C)  TempSrc:      SpO2: 98% 98%  95%  Weight:   112.7 kg   Height:        PHYSICAL EXAM General: Pleasant middle-age Caucasian male, well nourished, in no acute distress.  Sitting upright in hospital bed, wife at bedside HEENT:  Normocephalic and atraumatic. Neck:  No JVD.  Lungs: Normal respiratory effort on room air.  Decreased breath sounds without appreciable crackles or wheezes Heart: Irregularly irregular rhythm with controlled rate.  Normal S1 and S2 without gallops or murmurs.  Abdomen: Non-distended appearing with excess adiposity.  Msk: Normal strength and tone for age. Extremities: Warm and well perfused. No clubbing, cyanosis.  No peripheral edema.  Arteriotomy sites on right and left wrists are with mild tenderness to palpation, trace edema on his right hand, remains neurovascularly intact and 2+ right radial pulse.   Neuro: Alert and oriented X 3. Psych:  Answers questions appropriately.   Labs: Basic Metabolic  Panel: Recent Labs    01/05/22 2013 01/06/22 0532 01/07/22 0342  NA 138 137 137  K 3.9 3.8 3.5  CL 102 103 104  CO2 27 26 23   GLUCOSE 104* 114* 106*  BUN 20 23* 27*  CREATININE 1.14 1.17 1.19  CALCIUM 9.1 8.7* 8.5*  MG 2.2  --   --     Liver Function Tests: No results for input(s): "AST", "ALT", "ALKPHOS", "BILITOT", "PROT", "ALBUMIN" in the last 72 hours. No results for input(s): "LIPASE", "AMYLASE" in the last 72 hours. CBC: Recent Labs    01/06/22 0532  WBC 11.9*  HGB 13.7  HCT 39.9  MCV 86.0  PLT 251  Cardiac Enzymes: No results for input(s): "CKTOTAL", "CKMB", "CKMBINDEX", "TROPONINIHS" in the last 72 hours.  BNP: No results for input(s): "BNP" in the last 72 hours.  D-Dimer: No results for input(s): "DDIMER" in the last 72 hours. Hemoglobin A1C: Recent Labs    01/07/22 0342  HGBA1C 5.2   Fasting Lipid Panel: No results for input(s): "CHOL", "HDL", "LDLCALC", "TRIG", "CHOLHDL", "LDLDIRECT" in the last 72 hours. Thyroid Function Tests: No results for input(s): "TSH", "T4TOTAL", "T3FREE", "THYROIDAB" in the last 72 hours.  Invalid input(s): "FREET3"  Anemia Panel: No results for input(s): "VITAMINB12", "FOLATE", "FERRITIN", "TIBC", "IRON", "RETICCTPCT" in the last 72 hours.   Radiology: ECHOCARDIOGRAM COMPLETE  Result Date: 01/07/2022    ECHOCARDIOGRAM REPORT   Patient Name:   Andres [name] Date of Exam: 01/07/2022 Medical Rec #:  [id number]       Height:       72.0 in Accession #:    [id number]      Weight:       252.0 lb Date of Birth:  1966-01-11       BSA:          2.350 m Patient Age:    42 years        BP:           124/87 mmHg Patient Gender: M               HR:           71 bpm. Exam Location:  [facility] Procedure: 2D Echo, Color Doppler, Cardiac Doppler and Intracardiac            Opacification Agent Indications:     I50.31 congestive heart failure-Acute Diastolic  History:         Patient has no prior history of Echocardiogram examinations.                   CAD; Arrythmias:Atrial Fibrillation.  Sonographer:     Charmayne Sheer Referring Phys:  Devine Diagnosing Phys: Serafina Royals MD  Sonographer Comments: Suboptimal apical window. IMPRESSIONS  1. Left ventricular ejection fraction, by estimation, is 65 to 70%. The left ventricle has normal function. The left ventricle has no regional wall motion abnormalities. Left ventricular diastolic parameters were normal.  2. Right ventricular systolic function is normal. The right ventricular size is normal.  3. Left atrial size was mildly dilated.  4. The mitral valve is normal in structure. Moderate mitral valve regurgitation.  5. The aortic valve is normal in structure. Aortic valve regurgitation is trivial. FINDINGS  Left Ventricle: Left ventricular ejection fraction, by estimation, is 65 to 70%. The left ventricle has normal function. The left ventricle has no regional wall motion abnormalities. Definity contrast agent was given IV to delineate the left ventricular  endocardial borders. The left ventricular internal cavity size was normal in size. There is no left ventricular hypertrophy. Left ventricular diastolic parameters were normal. Right Ventricle: The right ventricular size is normal. No increase in right ventricular wall thickness. Right ventricular systolic function is normal. Left Atrium: Left atrial size was mildly dilated. Right Atrium: Right atrial size was normal in size. Pericardium: There is no evidence of pericardial effusion. Mitral Valve: The mitral valve is normal in structure. Moderate mitral valve regurgitation. Tricuspid Valve: The tricuspid valve is normal in structure. Tricuspid valve regurgitation is mild. Aortic Valve: The aortic valve is normal in structure. Aortic valve regurgitation is trivial. Aortic valve mean gradient measures 3.0 mmHg. Aortic valve  peak gradient measures 5.9 mmHg. Aortic valve area, by VTI measures 2.55 cm. Pulmonic Valve: The pulmonic valve was  normal in structure. Pulmonic valve regurgitation is trivial. Aorta: The aortic root and ascending aorta are structurally normal, with no evidence of dilitation. IAS/Shunts: No atrial level shunt detected by color flow Doppler.  LEFT VENTRICLE PLAX 2D LVIDd:         5.10 cm LVIDs:         2.70 cm LV PW:         1.10 cm LV IVS:        1.20 cm LVOT diam:     2.30 cm LV SV:         52 LV SV Index:   22 LVOT Area:     4.15 cm  RIGHT VENTRICLE RV Basal diam:  4.00 cm LEFT ATRIUM             Index        RIGHT ATRIUM           Index LA diam:        5.10 cm 2.17 cm/m   RA Area:     19.90 cm LA Vol (A2C):   49.8 ml 21.19 ml/m  RA Volume:   53.90 ml  22.93 ml/m LA Vol (A4C):   58.7 ml 24.97 ml/m LA Biplane Vol: 54.7 ml 23.27 ml/m  AORTIC VALVE                    PULMONIC VALVE AV Area (Vmax):    2.75 cm     PV Vmax:          0.83 m/s AV Area (Vmean):   2.55 cm     PV Vmean:         57.700 cm/s AV Area (VTI):     2.55 cm     PV VTI:           0.138 m AV Vmax:           121.00 cm/s  PV Peak grad:     2.8 mmHg AV Vmean:          84.100 cm/s  PV Mean grad:     2.0 mmHg AV VTI:            0.202 m      PR End Diast Vel: 13.25 msec AV Peak Grad:      5.9 mmHg AV Mean Grad:      3.0 mmHg LVOT Vmax:         80.00 cm/s LVOT Vmean:        51.700 cm/s LVOT VTI:          0.124 m LVOT/AV VTI ratio: 0.61  AORTA Ao Root diam: 3.10 cm MITRAL VALVE MV Area (PHT): 2.86 cm     SHUNTS MV Decel Time: 265 msec     Systemic VTI:  0.12 m MV E velocity: 176.00 cm/s  Systemic Diam: 2.30 cm Arnoldo Hooker MD Electronically signed by Arnoldo Hooker MD Signature Date/Time: 01/07/2022/12:21:08 PM    Final    CARDIAC CATHETERIZATION  Result Date: 01/06/2022   Prox RCA lesion is 20% stenosed.   Mid RCA lesion is 20% stenosed.   Dist RCA lesion is 40% stenosed.   RPAV lesion is 40% stenosed.   Prox Cx lesion is 50% stenosed.   2nd Mrg lesion is 50% stenosed.   1st Mrg lesion is 30% stenosed.   Prox LAD lesion is  50% stenosed.   1st Diag  lesion is 60% stenosed.   Previously placed Prox LAD to Mid LAD stent of unknown type is  widely patent.   The left ventricular systolic function is normal.   The left ventricular ejection fraction is 50-55% by visual estimate. 1.  Diffuse moderate nonobstructive coronary artery disease with patent stent proximal/mid LAD, 50% stenosis proximal LAD, 50% stenosis proximal left circumflex, 40% distal RCA 2.  Normal left ventricular function with estimated LVEF 50-55% Recommendations 1.  Medical therapy 2.  Resume Eliquis 01/07/2022   CT Angio Chest PE W/Cm &/Or Wo Cm  Result Date: 01/05/2022 CLINICAL DATA:  56 year old male with suspected pulmonary embolism. EXAM: CT ANGIOGRAPHY CHEST WITH CONTRAST TECHNIQUE: Multidetector CT imaging of the chest was performed using the standard protocol during bolus administration of intravenous contrast. Multiplanar CT image reconstructions and MIPs were obtained to evaluate the vascular anatomy. RADIATION DOSE REDUCTION: This exam was performed according to the departmental dose-optimization program which includes automated exposure control, adjustment of the mA and/or kV according to patient size and/or use of iterative reconstruction technique. CONTRAST:  75mL OMNIPAQUE IOHEXOL 350 MG/ML SOLN COMPARISON:  No priors. FINDINGS: Cardiovascular: There are no filling defects within the pulmonary arterial tree to suggest pulmonary embolism. Heart size is mildly enlarged. There is no significant pericardial fluid, thickening or pericardial calcification. There is aortic atherosclerosis, as well as atherosclerosis of the great vessels of the mediastinum and the coronary arteries, including calcified atherosclerotic plaque in the left main, left anterior descending, left circumflex and right coronary arteries. Mediastinum/Nodes: Multiple prominent borderline enlarged and mildly enlarged mediastinal and hilar lymph nodes are noted, with the largest mediastinal lymph node measuring up  to 2 cm in short axis in the subcarinal nodal station and 1.5 cm in the prevascular nodal station. Esophagus is unremarkable in appearance. No axillary lymphadenopathy. Lungs/Pleura: Diffuse ground-glass attenuation and widespread interlobular septal thickening is noted throughout the lungs bilaterally, suggesting a background of interstitial pulmonary edema. No confluent consolidative airspace disease. No pleural effusions. No definite suspicious appearing pulmonary nodules or masses are noted. Mild centrilobular and paraseptal emphysema predominantly in the lung apices. Upper Abdomen: Aortic atherosclerosis. Musculoskeletal: Healing nondisplaced fracture of the lateral aspect of the left sixth rib. There are no aggressive appearing lytic or blastic lesions noted in the visualized portions of the skeleton. Review of the MIP images confirms the above findings. IMPRESSION: 1. Cardiomegaly with evidence of interstitial pulmonary edema; imaging findings suggesting underlying congestive heart failure. 2. Multiple prominent borderline and mildly enlarged mediastinal and bilateral hilar lymph nodes. This is nonspecific in the setting of congestive heart failure, but follow-up contrast enhanced chest CT is recommended in 1 month after resolution of the patient's acute illness to re-evaluate these findings and ensure regression. Lymphoproliferative disease or other malignancy is not excluded. 3. Aortic atherosclerosis, in addition to left main and three-vessel coronary artery disease. Please note that although the presence of coronary artery calcium documents the presence of coronary artery disease, the severity of this disease and any potential stenosis cannot be assessed on this non-gated CT examination. Assessment for potential risk factor modification, dietary therapy or pharmacologic therapy may be warranted, if clinically indicated. 4. Healing nondisplaced fracture of the lateral aspect of the left sixth rib. Aortic  Atherosclerosis (ICD10-I70.0). Electronically Signed   By: Trudie Reed M.D.   On: 01/05/2022 05:14   DG Chest 2 View  Result Date: 01/05/2022 CLINICAL DATA:  56 year old male with history of chest pain and  shortness of breath. EXAM: CHEST - 2 VIEW COMPARISON:  Chest x-ray 01/02/2022. FINDINGS: There is cephalization of the pulmonary vasculature and slight indistinctness of the interstitial markings suggestive of mild pulmonary edema. No definite pleural effusions. No pneumothorax. Heart size is mildly enlarged. Upper mediastinal contours are within normal limits. IMPRESSION: 1. The appearance of the chest is most suggestive of congestive heart failure, as above. Electronically Signed   By: Trudie Reed M.D.   On: 01/05/2022 05:00   DG Chest Port 1 View  Result Date: 01/03/2022 CLINICAL DATA:  Chest pain EXAM: PORTABLE CHEST 1 VIEW COMPARISON:  01/24/2020 FINDINGS: Lungs are clear.  No pleural effusion or pneumothorax. The heart is normal in size. IMPRESSION: No evidence of acute cardiopulmonary disease. Electronically Signed   By: Charline Bills M.D.   On: 01/03/2022 00:02    ECHO 07/2013 greater than 55%  TELEMETRY reviewed by me (LT) 01/08/2022 : Atrial fibrillation rate low 100s - 110s and paroxysms to the 120s-130s  EKG reviewed by me: Atrial flutter with variable conduction rate 88, repeat 10/18 atrial flutter with variable conduction, rate 88, QTc 471  Data reviewed by me (LT) 01/08/2022: Hospitalist progress note, ordered magnesium and potassium, reviewed vitals, telemetry EKG  Principal Problem:   CHF (congestive heart failure) (HCC) Active Problems:   CAD S/P percutaneous coronary angioplasty   Tobacco use disorder   Elevated troponin   Acute respiratory failure with hypoxia (HCC)   Atrial fibrillation, persistent (HCC)   Leukocytosis   Dysuria    ASSESSMENT AND PLAN:  Andres [name] is a 56yoM with a PMH of CAD s/p PCI with DES to LAD 2013, paroxysmal atrial  fibrillation on Eliquis, GERD, tobacco use who initially presented to [facility] ED 01/02/2022 with chest discomfort and was found to be in new onset atrial fibrillation with RVR, was treated and discharged from the ED after several hours.  He represented to [facility] ED the early morning hours of the following day with shortness of breath and chest discomfort.  He underwent left heart cath and coronary angiography on 10/16 for further evaluation of his chest pain, which revealed diffuse nonobstructive CAD with a patent proximal/mid LAD stent, 50% stenosis proximal LAD, 50% stenosis proximal LCx, 40% distal RCA, LV gram with normal LVEF estimated 50-55% for which medical therapy was recommended.  #CAD with chest pain #Elevated troponins Troponin this admission with a flat trend at 228-295, [facility] 10/16 revealed diffuse nonobstructive CAD for which medical therapy was recommended.  Likely elevated in the setting of demand/supply mismatch from tachycardia and volume overload and not ACS. -Continue aspirin 81 mg daily -He has a statin allergy listed, but when discussed with patient, he does not ever remember being on a statin (Lipitor, Crestor), he is agreeable to try atorvastatin 40 mg once daily -May still need Praluent or Repatha for optimal management, to be done on an outpatient basis.  #Paroxysmal atrial fibrillation with RVR Mostly rate controlled in the low 100s, paroxysms to the 110s-120s with activity although asymptomatic.  Suspect most of his chest discomfort is related to periods of RVR causing his decompensation/fluid retention. -Continue amiodarone load with 200 mg twice daily x5 days, then 200 mg daily thereafter -increase metoprolol tartrate to  twice daily for rate control -check Mg and K and replete as necessary -Rx PRN diltiazem  q8h to take if HR sustains >130 x 15 minutes -Resume Eliquis for stroke risk reduction today.  CHA2DS2-VASc 2 (HTN, CAD).  After 3 weeks of appropriate  anticoagulation, consider cardioversion if he remains in atrial fibrillation.  #Acute on chronic HFpEF BNP on admission elevated at 800, has diuresed well since admission with net -4 L on 10/17. -Appears euvolemic on exam 10/18, is comfortable on room air. -Will discharge on Lasix 20 mg once daily -Discussed salt and fluid restriction with patient -echocardiogram complete, resulted with preserved LVEF 65%, moderate MR  #Tobacco abuse Discussed in detail complete smoking cessation, he is motivated to quit.  He is okay for discharge today from a cardiac standpoint.  Follow-up with Dr. Juliann Pares in 1 to 2 weeks.  This patient's plan of care was discussed and created with Dr. Gwen Pounds and he is in agreement.  Signed: Rebeca Allegra , PA-C 01/08/2022, 9:20 AM [facility] Cardiology

## 2022-01-08 NOTE — Discharge Summary (Signed)
Physician Discharge Summary   Patient: Andres [name] MRN: [id number] DOB: 1965-11-13  Admit date:     01/05/2022  Discharge date: 01/08/22  Discharge Physician: Lorella Nimrod   PCP: [facility]   Recommendations at discharge:  Please obtain CBC and BMP in 1 week Follow-up with cardiology within a week Follow-up with primary care provider.  Discharge Diagnoses: Principal Problem:   CHF (congestive heart failure) (HCC) Active Problems:   CAD S/P percutaneous coronary angioplasty   Acute respiratory failure with hypoxia (HCC)   Tobacco use disorder   Elevated troponin   Atrial fibrillation, persistent ([place])   Leukocytosis   Dysuria   Hospital Course: Taken from prior notes.  Andres [name] is a 56 y.o. male with medical history significant  for CAD status post PCI with stent angioplasty in LAD in 2013, A-fib on anticoagulation, psoriasis, GERD, insomnia, nicotine dependence, who was recently discharged from the hospital after an admission for A-fib with a rapid ventricular rate.  He was discharged home in stable condition on amiodarone, metoprolol and Eliquis and return to the emergency room in the early hours of the morning for evaluation of shortness of breath and chest pain. On arrival to ED, patient was hypoxic with increased work of breathing and pulse oximetry of 86% requiring up to 5 L of oxygen. Chest x-ray suggestive of pulmonary vascular congestion and BNP and troponin was elevated.  Patient was started on Lasix.  Patient underwent left heart catheterization on 01/06/2022 which shows Diffuse moderate nonobstructive coronary artery disease with patent stent proximal/mid LAD, 50% stenosis proximal LAD, 50% stenosis proximal left circumflex, 40% distal RCA.  Widely patent previously placed stent in LAD.  EF was normal. Cardiology is recommending medical therapy. Echocardiogram done on 01/07/2022 was without any significant abnormality.  10/18: Patient with  short runs of NSVT and intermittent A-fib with RVR, rate remained between 100-110 most of the time overnight.  Cardiology is increasing the dose of metoprolol to 75 mg twice daily.  IV Lasix was converted to p.o. at 20 mg daily. Urine cultures negative-discontinue antibiotics.  Patient remained stable with improved heart rate with this increase in dose of metoprolol.  Home Cardizem was discontinued.  He is being discharged on 20 mg of Lasix and statin as new medications.  He will be taking 75 mg of metoprolol twice daily instead of 50. Cardiology is recommending 200 mg of amiodarone until Friday and starting from Saturday once daily.  Patient will continue on current medications and need to have a close follow-up with his providers for further recommendations.    Assessment and Plan: * CHF (congestive heart failure) ([place]) Acute CHF of unclear etiology Patient presented for sudden onset shortness of breath and was hypoxic with room air pulse oximetry of 86% requiring oxygen supplementation Chest x-ray showed findings suggestive of CHF and BNP was elevated. Place patient on Lasix 40 mg IV daily Obtain 2D echocardiogram to assess LVEF Continue metoprolol We will consult cardiology  Acute respiratory failure with hypoxia ([place]) Secondary to acute CHF Patient presented to the ER for evaluation of shortness of breath with increased work of breathing, use of accessory muscles and hypoxia with room air pulse oximetry of 86% and. He is currently on 5 L of oxygen with improvement in pulse oximetry to greater than 92% We will attempt to wean patient off oxygen as tolerated once acute illness is improved or resolved  CAD S/P percutaneous coronary angioplasty History of coronary artery disease status post  stent angioplasty CT angiogram of the chest shows aortic atherosclerosis, in addition to left main and three-vessel coronary artery disease. Continue aspirin and metoprolol Consult  cardiology  Tobacco use disorder Smoking cessation has been discussed with patient in detail He declines a nicotine transdermal patch at this time  Leukocytosis Unclear etiology and no evidence of an infectious process at this time. CT angiogram shows multiple prominent borderline and mildly enlarged mediastinal and bilateral hilar lymph nodes. This is nonspecific in the setting of congestive heart failure, but follow-up contrast enhanced chest CT is recommended in 1 month after resolution of the patient's acute illness to re-evaluate these findings and ensure regression  Atrial fibrillation, persistent (HCC) Continue amiodarone and metoprolol for rate control Continue apixaban as primary prophylaxis for an acute stroke  Elevated troponin In a patient with known coronary artery disease status post stent angioplasty We will cycle cardiac enzymes Continue aspirin and metoprolol Follow-up results of 2D echocardiogram to assess LVEF and rule out regional wall motion abnormality   Consultants: Cardiology Procedures performed: Left heart catheterization Disposition: Home Diet recommendation:  Discharge Diet Orders (From admission, onward)     Start     Ordered   01/08/22 0000  Diet - low sodium heart healthy        01/08/22 1230           Cardiac diet DISCHARGE MEDICATION: Allergies as of 01/08/2022       Reactions   Statins Other (See Comments)   Myalgias, cannot remember which statin he took in the past but willing to try atorvastatin 12/2021        Medication List     STOP taking these medications    diltiazem 30 MG tablet Commonly known as: Cardizem   FLUoxetine 10 MG capsule Commonly known as: PROZAC   HYDROcodone-acetaminophen 5-325 MG tablet Commonly known as: NORCO/VICODIN   traZODone 100 MG tablet Commonly known as: DESYREL       TAKE these medications    amiodarone 200 MG tablet Commonly known as: Pacerone Take 1 tablet (200 mg total) by  mouth 2 (two) times daily. Until Friday, then start taking once daily What changed: additional instructions   apixaban 5 MG Tabs tablet Commonly known as: ELIQUIS Take 1 tablet (5 mg total) by mouth 2 (two) times daily.   aspirin EC 81 MG tablet Take 1 tablet (81 mg total) by mouth daily. Swallow whole.   atorvastatin 40 MG tablet Commonly known as: LIPITOR Take 1 tablet (40 mg total) by mouth every evening.   Cyanocobalamin 1000 MCG Tbcr Take 1 tablet by mouth daily.   furosemide 20 MG tablet Commonly known as: LASIX Take 1 tablet (20 mg total) by mouth daily. Start taking on: January 09, 2022   Metoprolol Tartrate 75 MG Tabs Take 75 mg by mouth 2 (two) times daily. What changed:  medication strength how much to take   nitroGLYCERIN 0.4 MG SL tablet Commonly known as: NITROSTAT 1 TABLET UNDER TONGUE AT ONSET OF CHEST PAIN. REPEAT IN 5 MIN IF NOT RESOLVED, MAX 3 DOSES. 911 IF NEEDED.        Follow-up Information     Alwyn Pea, MD. Go in 1 week(s).   Specialties: Cardiology, Internal Medicine Contact information: [address] [place] [address] [phone]                Discharge Exam: Ceasar Mons Weights   01/05/22 0347 01/06/22 0713 01/08/22 0541  Weight: 113.4 kg 114.3 kg  112.7 kg   General.     In no acute distress. Pulmonary.  Lungs clear bilaterally, normal respiratory effort. CV.  Irregularly irregular, no JVD, rub or murmur. Abdomen.  Soft, nontender, nondistended, BS positive. CNS.  Alert and oriented .  No focal neurologic deficit. Extremities.  No edema, no cyanosis, pulses intact and symmetrical. Psychiatry.  Judgment and insight appears normal.   Condition at discharge: stable  The results of significant diagnostics from this hospitalization (including imaging, microbiology, ancillary and laboratory) are listed below for reference.   Imaging Studies: ECHOCARDIOGRAM COMPLETE  Result Date: 01/07/2022    ECHOCARDIOGRAM  REPORT   Patient Name:   Andres [name] Date of Exam: 01/07/2022 Medical Rec #:  [id number]       Height:       72.0 in Accession #:    [id number]      Weight:       252.0 lb Date of Birth:  08-02-1965       BSA:          2.350 m Patient Age:    56 years        BP:           124/87 mmHg Patient Gender: M               HR:           71 bpm. Exam Location:  [facility] Procedure: 2D Echo, Color Doppler, Cardiac Doppler and Intracardiac            Opacification Agent Indications:     I50.31 congestive heart failure-Acute Diastolic  History:         Patient has no prior history of Echocardiogram examinations.                  CAD; Arrythmias:Atrial Fibrillation.  Sonographer:     Humphrey Rolls Referring Phys:  [id number] AGBATA Diagnosing Phys: Arnoldo Hooker MD  Sonographer Comments: Suboptimal apical window. IMPRESSIONS  1. Left ventricular ejection fraction, by estimation, is 65 to 70%. The left ventricle has normal function. The left ventricle has no regional wall motion abnormalities. Left ventricular diastolic parameters were normal.  2. Right ventricular systolic function is normal. The right ventricular size is normal.  3. Left atrial size was mildly dilated.  4. The mitral valve is normal in structure. Moderate mitral valve regurgitation.  5. The aortic valve is normal in structure. Aortic valve regurgitation is trivial. FINDINGS  Left Ventricle: Left ventricular ejection fraction, by estimation, is 65 to 70%. The left ventricle has normal function. The left ventricle has no regional wall motion abnormalities. Definity contrast agent was given IV to delineate the left ventricular  endocardial borders. The left ventricular internal cavity size was normal in size. There is no left ventricular hypertrophy. Left ventricular diastolic parameters were normal. Right Ventricle: The right ventricular size is normal. No increase in right ventricular wall thickness. Right ventricular systolic function is normal. Left Atrium:  Left atrial size was mildly dilated. Right Atrium: Right atrial size was normal in size. Pericardium: There is no evidence of pericardial effusion. Mitral Valve: The mitral valve is normal in structure. Moderate mitral valve regurgitation. Tricuspid Valve: The tricuspid valve is normal in structure. Tricuspid valve regurgitation is mild. Aortic Valve: The aortic valve is normal in structure. Aortic valve regurgitation is trivial. Aortic valve mean gradient measures 3.0 mmHg. Aortic valve peak gradient measures 5.9 mmHg. Aortic valve area, by VTI measures 2.55 cm. Pulmonic Valve: The  pulmonic valve was normal in structure. Pulmonic valve regurgitation is trivial. Aorta: The aortic root and ascending aorta are structurally normal, with no evidence of dilitation. IAS/Shunts: No atrial level shunt detected by color flow Doppler.  LEFT VENTRICLE PLAX 2D LVIDd:         5.10 cm LVIDs:         2.70 cm LV PW:         1.10 cm LV IVS:        1.20 cm LVOT diam:     2.30 cm LV SV:         52 LV SV Index:   22 LVOT Area:     4.15 cm  RIGHT VENTRICLE RV Basal diam:  4.00 cm LEFT ATRIUM             Index        RIGHT ATRIUM           Index LA diam:        5.10 cm 2.17 cm/m   RA Area:     19.90 cm LA Vol (A2C):   49.8 ml 21.19 ml/m  RA Volume:   53.90 ml  22.93 ml/m LA Vol (A4C):   58.7 ml 24.97 ml/m LA Biplane Vol: 54.7 ml 23.27 ml/m  AORTIC VALVE                    PULMONIC VALVE AV Area (Vmax):    2.75 cm     PV Vmax:          0.83 m/s AV Area (Vmean):   2.55 cm     PV Vmean:         57.700 cm/s AV Area (VTI):     2.55 cm     PV VTI:           0.138 m AV Vmax:           121.00 cm/s  PV Peak grad:     2.8 mmHg AV Vmean:          84.100 cm/s  PV Mean grad:     2.0 mmHg AV VTI:            0.202 m      PR End Diast Vel: 13.25 msec AV Peak Grad:      5.9 mmHg AV Mean Grad:      3.0 mmHg LVOT Vmax:         80.00 cm/s LVOT Vmean:        51.700 cm/s LVOT VTI:          0.124 m LVOT/AV VTI ratio: 0.61  AORTA Ao Root diam: 3.10  cm MITRAL VALVE MV Area (PHT): 2.86 cm     SHUNTS MV Decel Time: 265 msec     Systemic VTI:  0.12 m MV E velocity: 176.00 cm/s  Systemic Diam: 2.30 cm Arnoldo HookerBruce Kowalski MD Electronically signed by Arnoldo HookerBruce Kowalski MD Signature Date/Time: 01/07/2022/12:21:08 PM    Final    CARDIAC CATHETERIZATION  Result Date: 01/06/2022   Prox RCA lesion is 20% stenosed.   Mid RCA lesion is 20% stenosed.   Dist RCA lesion is 40% stenosed.   RPAV lesion is 40% stenosed.   Prox Cx lesion is 50% stenosed.   2nd Mrg lesion is 50% stenosed.   1st Mrg lesion is 30% stenosed.   Prox LAD lesion is 50% stenosed.   1st Diag lesion is 60% stenosed.   Previously placed Prox LAD  to Mid LAD stent of unknown type is  widely patent.   The left ventricular systolic function is normal.   The left ventricular ejection fraction is 50-55% by visual estimate. 1.  Diffuse moderate nonobstructive coronary artery disease with patent stent proximal/mid LAD, 50% stenosis proximal LAD, 50% stenosis proximal left circumflex, 40% distal RCA 2.  Normal left ventricular function with estimated LVEF 50-55% Recommendations 1.  Medical therapy 2.  Resume Eliquis 01/07/2022   CT Angio Chest PE W/Cm &/Or Wo Cm  Result Date: 01/05/2022 CLINICAL DATA:  56 year old male with suspected pulmonary embolism. EXAM: CT ANGIOGRAPHY CHEST WITH CONTRAST TECHNIQUE: Multidetector CT imaging of the chest was performed using the standard protocol during bolus administration of intravenous contrast. Multiplanar CT image reconstructions and MIPs were obtained to evaluate the vascular anatomy. RADIATION DOSE REDUCTION: This exam was performed according to the departmental dose-optimization program which includes automated exposure control, adjustment of the mA and/or kV according to patient size and/or use of iterative reconstruction technique. CONTRAST:  19mL OMNIPAQUE IOHEXOL 350 MG/ML SOLN COMPARISON:  No priors. FINDINGS: Cardiovascular: There are no filling defects within the  pulmonary arterial tree to suggest pulmonary embolism. Heart size is mildly enlarged. There is no significant pericardial fluid, thickening or pericardial calcification. There is aortic atherosclerosis, as well as atherosclerosis of the great vessels of the mediastinum and the coronary arteries, including calcified atherosclerotic plaque in the left main, left anterior descending, left circumflex and right coronary arteries. Mediastinum/Nodes: Multiple prominent borderline enlarged and mildly enlarged mediastinal and hilar lymph nodes are noted, with the largest mediastinal lymph node measuring up to 2 cm in short axis in the subcarinal nodal station and 1.5 cm in the prevascular nodal station. Esophagus is unremarkable in appearance. No axillary lymphadenopathy. Lungs/Pleura: Diffuse ground-glass attenuation and widespread interlobular septal thickening is noted throughout the lungs bilaterally, suggesting a background of interstitial pulmonary edema. No confluent consolidative airspace disease. No pleural effusions. No definite suspicious appearing pulmonary nodules or masses are noted. Mild centrilobular and paraseptal emphysema predominantly in the lung apices. Upper Abdomen: Aortic atherosclerosis. Musculoskeletal: Healing nondisplaced fracture of the lateral aspect of the left sixth rib. There are no aggressive appearing lytic or blastic lesions noted in the visualized portions of the skeleton. Review of the MIP images confirms the above findings. IMPRESSION: 1. Cardiomegaly with evidence of interstitial pulmonary edema; imaging findings suggesting underlying congestive heart failure. 2. Multiple prominent borderline and mildly enlarged mediastinal and bilateral hilar lymph nodes. This is nonspecific in the setting of congestive heart failure, but follow-up contrast enhanced chest CT is recommended in 1 month after resolution of the patient's acute illness to re-evaluate these findings and ensure regression.  Lymphoproliferative disease or other malignancy is not excluded. 3. Aortic atherosclerosis, in addition to left main and three-vessel coronary artery disease. Please note that although the presence of coronary artery calcium documents the presence of coronary artery disease, the severity of this disease and any potential stenosis cannot be assessed on this non-gated CT examination. Assessment for potential risk factor modification, dietary therapy or pharmacologic therapy may be warranted, if clinically indicated. 4. Healing nondisplaced fracture of the lateral aspect of the left sixth rib. Aortic Atherosclerosis (ICD10-I70.0). Electronically Signed   By: Trudie Reed M.D.   On: 01/05/2022 05:14   DG Chest 2 View  Result Date: 01/05/2022 CLINICAL DATA:  56 year old male with history of chest pain and shortness of breath. EXAM: CHEST - 2 VIEW COMPARISON:  Chest x-ray 01/02/2022. FINDINGS: There is cephalization  of the pulmonary vasculature and slight indistinctness of the interstitial markings suggestive of mild pulmonary edema. No definite pleural effusions. No pneumothorax. Heart size is mildly enlarged. Upper mediastinal contours are within normal limits. IMPRESSION: 1. The appearance of the chest is most suggestive of congestive heart failure, as above. Electronically Signed   By: Trudie Reed M.D.   On: 01/05/2022 05:00   DG Chest Port 1 View  Result Date: 01/03/2022 CLINICAL DATA:  Chest pain EXAM: PORTABLE CHEST 1 VIEW COMPARISON:  01/24/2020 FINDINGS: Lungs are clear.  No pleural effusion or pneumothorax. The heart is normal in size. IMPRESSION: No evidence of acute cardiopulmonary disease. Electronically Signed   By: Charline Bills M.D.   On: 01/03/2022 00:02    Microbiology: Results for orders placed or performed during the hospital encounter of 01/05/22  Resp Panel by RT-PCR (Flu A&B, Covid) Anterior Nasal Swab     Status: None   Collection Time: 01/05/22  4:04 AM   Specimen:  Anterior Nasal Swab  Result Value Ref Range Status   SARS Coronavirus 2 by RT PCR NEGATIVE NEGATIVE Final    Comment: (NOTE) SARS-CoV-2 target nucleic acids are NOT DETECTED.  The SARS-CoV-2 RNA is generally detectable in upper respiratory specimens during the acute phase of infection. The lowest concentration of SARS-CoV-2 viral copies this assay can detect is 138 copies/mL. A negative result does not preclude SARS-Cov-2 infection and should not be used as the sole basis for treatment or other patient management decisions. A negative result may occur with  improper specimen collection/handling, submission of specimen other than nasopharyngeal swab, presence of viral mutation(s) within the areas targeted by this assay, and inadequate number of viral copies(<138 copies/mL). A negative result must be combined with clinical observations, patient history, and epidemiological information. The expected result is Negative.  Fact Sheet for Patients:  BloggerCourse.com  Fact Sheet for Healthcare Providers:  SeriousBroker.it  This test is no t yet approved or cleared by the [place] FDA and  has been authorized for detection and/or diagnosis of SARS-CoV-2 by FDA under an Emergency Use Authorization (EUA). This EUA will remain  in effect (meaning this test can be used) for the duration of the COVID-19 declaration under Section 564(b)(1) of the Act, 21 U.S.C.section 360bbb-3(b)(1), unless the authorization is terminated  or revoked sooner.       Influenza A by PCR NEGATIVE NEGATIVE Final   Influenza B by PCR NEGATIVE NEGATIVE Final    Comment: (NOTE) The Xpert Xpress SARS-CoV-2/FLU/RSV plus assay is intended as an aid in the diagnosis of influenza from Nasopharyngeal swab specimens and should not be used as a sole basis for treatment. Nasal washings and aspirates are unacceptable for Xpert Xpress SARS-CoV-2/FLU/RSV testing.  Fact  Sheet for Patients: BloggerCourse.com  Fact Sheet for Healthcare Providers: SeriousBroker.it  This test is not yet approved or cleared by the [place] FDA and has been authorized for detection and/or diagnosis of SARS-CoV-2 by FDA under an Emergency Use Authorization (EUA). This EUA will remain in effect (meaning this test can be used) for the duration of the COVID-19 declaration under Section 564(b)(1) of the Act, 21 U.S.C. section 360bbb-3(b)(1), unless the authorization is terminated or revoked.  Performed at [facility], [address], [place], [place] [address]   Urine Culture     Status: None   Collection Time: 01/06/22  4:50 PM   Specimen: Urine, Clean Catch  Result Value Ref Range Status   Specimen Description   Final  URINE, CLEAN CATCH Performed at [facility], [address], [place], [place] [address]    Special Requests   Final    NONE Performed at [facility], [address] [place], [place] [address]    Culture   Final    NO GROWTH Performed at [facility], [address], [place], [place] [address]    Report Status 01/07/2022 FINAL  Final    Labs: CBC: Recent Labs  Lab 01/02/22 2337 01/05/22 0400 01/06/22 0532  WBC 9.7 18.7* 11.9*  HGB 15.2 15.9 13.7  HCT 44.0 45.3 39.9  MCV 87.0 86.0 86.0  PLT 235 297 251   Basic Metabolic Panel: Recent Labs  Lab 01/02/22 2337 01/05/22 0400 01/05/22 2013 01/06/22 0532 01/07/22 0342 01/08/22 0902  NA 138 138 138 137 137  --   K 3.5 3.7 3.9 3.8 3.5 3.6  CL 105 109 102 103 104  --   CO2 23 21* 27 26 23   --   GLUCOSE 171* 101* 104* 114* 106*  --   BUN 16 16 20  23* 27*  --   CREATININE 1.13 0.99 1.14 1.17 1.19  --   CALCIUM 9.1 8.7* 9.1 8.7* 8.5*  --   MG  --   --  2.2  --   --  2.1   Liver Function Tests: No results for input(s): "AST", "ALT", "ALKPHOS", "BILITOT", "PROT", "ALBUMIN" in the last 168  hours. CBG: No results for input(s): "GLUCAP" in the last 168 hours.  Discharge time spent: greater than 30 minutes.  This record has been created using . Errors have been sought and corrected,but may not always be located. Such creation errors do not reflect on the standard of care.   Signed: , MD [facility] 01/08/2022

## 2022-01-08 NOTE — TOC CM/SW Note (Signed)
  Transition of Care [facility]) Screening Note   Patient Details  Name: Andres [name] Date of Birth: October 20, 1965   Transition of Care [facility]) CM/SW Contact:    Candie Chroman, LCSW Phone Number: 01/08/2022, 9:39 AM    Transition of Care Department [facility]) has reviewed patient and no TOC needs have been identified at this time. We will continue to monitor patient advancement through interdisciplinary progression rounds. If new patient transition needs arise, please place a TOC consult.

## 2022-01-08 NOTE — Hospital Course (Addendum)
Taken from prior notes.  Andres [name] is a 57 y.o. male with medical history significant  for CAD status post PCI with stent angioplasty in LAD in 2013, A-fib on anticoagulation, psoriasis, GERD, insomnia, nicotine dependence, who was recently discharged from the hospital after an admission for A-fib with a rapid ventricular rate.  He was discharged home in stable condition on amiodarone, metoprolol and Eliquis and return to the emergency room in the early hours of the morning for evaluation of shortness of breath and chest pain. On arrival to ED, patient was hypoxic with increased work of breathing and pulse oximetry of 86% requiring up to 5 L of oxygen. Chest x-ray suggestive of pulmonary vascular congestion and BNP and troponin was elevated.  Patient was started on Lasix.  Patient underwent left heart catheterization on 01/06/2022 which shows Diffuse moderate nonobstructive coronary artery disease with patent stent proximal/mid LAD, 50% stenosis proximal LAD, 50% stenosis proximal left circumflex, 40% distal RCA.  Widely patent previously placed stent in LAD.  EF was normal. Cardiology is recommending medical therapy. Echocardiogram done on 01/07/2022 was without any significant abnormality.  10/18: Patient with short runs of NSVT and intermittent A-fib with RVR, rate remained between 100-110 most of the time overnight.  Cardiology is increasing the dose of metoprolol to 75 mg twice daily.  IV Lasix was converted to p.o. at 20 mg daily. Urine cultures negative-discontinue antibiotics.  Patient remained stable with improved heart rate with this increase in dose of metoprolol.  Home Cardizem was discontinued.  He is being discharged on 20 mg of Lasix and statin as new medications.  He will be taking 75 mg of metoprolol twice daily instead of 50. Cardiology is recommending 200 mg of amiodarone until Friday and starting from Saturday once daily.  Patient will continue on current medications and  need to have a close follow-up with his providers for further recommendations.

## 2022-01-09 ENCOUNTER — Other Ambulatory Visit: Payer: Self-pay

## 2022-01-09 ENCOUNTER — Encounter: Payer: Self-pay | Admitting: Emergency Medicine

## 2022-01-09 ENCOUNTER — Emergency Department: Payer: BC Managed Care – PPO

## 2022-01-09 DIAGNOSIS — R0602 Shortness of breath: Secondary | ICD-10-CM | POA: Insufficient documentation

## 2022-01-09 DIAGNOSIS — I509 Heart failure, unspecified: Secondary | ICD-10-CM | POA: Insufficient documentation

## 2022-01-09 DIAGNOSIS — I2510 Atherosclerotic heart disease of native coronary artery without angina pectoris: Secondary | ICD-10-CM | POA: Insufficient documentation

## 2022-01-09 DIAGNOSIS — Z7901 Long term (current) use of anticoagulants: Secondary | ICD-10-CM | POA: Insufficient documentation

## 2022-01-09 DIAGNOSIS — I4891 Unspecified atrial fibrillation: Secondary | ICD-10-CM | POA: Insufficient documentation

## 2022-01-09 LAB — CBC
HCT: 43.1 % (ref 39.0–52.0)
Hemoglobin: 14.6 g/dL (ref 13.0–17.0)
MCH: 29.2 pg (ref 26.0–34.0)
MCHC: 33.9 g/dL (ref 30.0–36.0)
MCV: 86.2 fL (ref 80.0–100.0)
Platelets: 367 10*3/uL (ref 150–400)
RBC: 5 MIL/uL (ref 4.22–5.81)
RDW: 13.2 % (ref 11.5–15.5)
WBC: 13.5 10*3/uL — ABNORMAL HIGH (ref 4.0–10.5)
nRBC: 0 % (ref 0.0–0.2)

## 2022-01-09 NOTE — ED Triage Notes (Signed)
Pt to triage via w/c with no distress noted; reports d/c yesterday for afib; cont to have mid CP & [facility]

## 2022-01-10 ENCOUNTER — Other Ambulatory Visit: Payer: Self-pay

## 2022-01-10 ENCOUNTER — Observation Stay
Admission: EM | Admit: 2022-01-10 | Discharge: 2022-01-10 | Discharge status: Against Medical Advice | Payer: BC Managed Care – PPO | Source: Home / Self Care | Attending: Emergency Medicine | Admitting: Emergency Medicine

## 2022-01-10 ENCOUNTER — Emergency Department: Payer: BC Managed Care – PPO

## 2022-01-10 ENCOUNTER — Encounter: Payer: Self-pay | Admitting: Internal Medicine

## 2022-01-10 ENCOUNTER — Inpatient Hospital Stay (HOSPITAL_COMMUNITY)
Admission: EM | Admit: 2022-01-10 | Discharge: 2022-01-13 | Disposition: A | Discharge status: Home / Self Care | Payer: BC Managed Care – PPO | Source: Home / Self Care | Attending: Internal Medicine | Admitting: Internal Medicine

## 2022-01-10 DIAGNOSIS — I509 Heart failure, unspecified: Secondary | ICD-10-CM

## 2022-01-10 DIAGNOSIS — I2510 Atherosclerotic heart disease of native coronary artery without angina pectoris: Secondary | ICD-10-CM

## 2022-01-10 DIAGNOSIS — I4891 Unspecified atrial fibrillation: Secondary | ICD-10-CM | POA: Diagnosis present

## 2022-01-10 DIAGNOSIS — J9601 Acute respiratory failure with hypoxia: Secondary | ICD-10-CM | POA: Diagnosis present

## 2022-01-10 LAB — BRAIN NATRIURETIC PEPTIDE
B Natriuretic Peptide: 1269.6 pg/mL — ABNORMAL HIGH (ref 0.0–100.0)
B Natriuretic Peptide: 1214.8 pg/mL — ABNORMAL HIGH (ref 0.0–100.0)

## 2022-01-10 LAB — BASIC METABOLIC PANEL
Anion gap: 8 (ref 5–15)
BUN: 23 mg/dL — ABNORMAL HIGH (ref 6–20)
CO2: 20 mmol/L — ABNORMAL LOW (ref 22–32)
Calcium: 9.4 mg/dL (ref 8.9–10.3)
Chloride: 109 mmol/L (ref 98–111)
Creatinine, Ser: 1.31 mg/dL — ABNORMAL HIGH (ref 0.61–1.24)
GFR, Estimated: >60 mL/min (ref >60)
Glucose, Bld: 116 mg/dL — ABNORMAL HIGH (ref 70–99)
Potassium: 4.1 mmol/L (ref 3.5–5.1)
Sodium: 137 mmol/L (ref 135–145)

## 2022-01-10 LAB — TROPONIN I (HIGH SENSITIVITY)
Troponin I (High Sensitivity): 20 ng/L — ABNORMAL HIGH (ref <18)
Troponin I (High Sensitivity): 21 ng/L — ABNORMAL HIGH (ref <18)
Troponin I (High Sensitivity): 24 ng/L — ABNORMAL HIGH (ref <18)
Troponin I (High Sensitivity): 24 ng/L — ABNORMAL HIGH (ref <18)

## 2022-01-10 LAB — COMPREHENSIVE METABOLIC PANEL
ALT: 45 U/L — ABNORMAL HIGH (ref 0–44)
AST: 24 U/L (ref 15–41)
Albumin: 3.7 g/dL (ref 3.5–5.0)
Alkaline Phosphatase: 56 U/L (ref 38–126)
Anion gap: 10 (ref 5–15)
BUN: 27 mg/dL — ABNORMAL HIGH (ref 6–20)
CO2: 24 mmol/L (ref 22–32)
Calcium: 9.1 mg/dL (ref 8.9–10.3)
Chloride: 104 mmol/L (ref 98–111)
Creatinine, Ser: 1.45 mg/dL — ABNORMAL HIGH (ref 0.61–1.24)
GFR, Estimated: 56 mL/min — ABNORMAL LOW (ref >60)
Glucose, Bld: 132 mg/dL — ABNORMAL HIGH (ref 70–99)
Potassium: 4.1 mmol/L (ref 3.5–5.1)
Sodium: 138 mmol/L (ref 135–145)
Total Bilirubin: 1.3 mg/dL — ABNORMAL HIGH (ref 0.3–1.2)
Total Protein: 7.5 g/dL (ref 6.5–8.1)

## 2022-01-10 LAB — CBC WITH DIFFERENTIAL/PLATELET
Abs Immature Granulocytes: 0.19 10*3/uL — ABNORMAL HIGH (ref 0.00–0.07)
Basophils Absolute: 0.1 10*3/uL (ref 0.0–0.1)
Basophils Relative: 1 %
Eosinophils Absolute: 0.4 10*3/uL (ref 0.0–0.5)
Eosinophils Relative: 3 %
HCT: 42.9 % (ref 39.0–52.0)
Hemoglobin: 14.9 g/dL (ref 13.0–17.0)
Immature Granulocytes: 1 %
Lymphocytes Relative: 15 %
Lymphs Abs: 2.3 10*3/uL (ref 0.7–4.0)
MCH: 30.2 pg (ref 26.0–34.0)
MCHC: 34.7 g/dL (ref 30.0–36.0)
MCV: 87 fL (ref 80.0–100.0)
Monocytes Absolute: 0.8 10*3/uL (ref 0.1–1.0)
Monocytes Relative: 5 %
Neutro Abs: 11.5 10*3/uL — ABNORMAL HIGH (ref 1.7–7.7)
Neutrophils Relative %: 75 %
Platelets: 397 10*3/uL (ref 150–400)
RBC: 4.93 MIL/uL (ref 4.22–5.81)
RDW: 13.3 % (ref 11.5–15.5)
WBC: 15.4 10*3/uL — ABNORMAL HIGH (ref 4.0–10.5)
nRBC: 0 % (ref 0.0–0.2)

## 2022-01-10 LAB — T4, FREE: Free T4: 0.96 ng/dL (ref 0.61–1.12)

## 2022-01-10 LAB — D-DIMER, QUANTITATIVE: D-Dimer, Quant: 1.41 ug/mL-FEU — ABNORMAL HIGH (ref 0.00–0.50)

## 2022-01-10 LAB — TSH: TSH: 12.758 u[IU]/mL — ABNORMAL HIGH (ref 0.350–4.500)

## 2022-01-10 LAB — RESP PANEL BY RT-PCR (FLU A&B, COVID) ARPGX2
Influenza A by PCR: NEGATIVE
Influenza B by PCR: NEGATIVE
SARS Coronavirus 2 by RT PCR: NEGATIVE

## 2022-01-10 MED ORDER — BUMETANIDE 0.25 MG/ML IJ SOLN
2.0000 mg | Freq: Two times a day (BID) | INTRAMUSCULAR | Status: DC
  Administered 2022-01-10: 2 mg via INTRAVENOUS
  Filled 2022-01-10 (×2): qty 8

## 2022-01-10 MED ORDER — FUROSEMIDE 10 MG/ML IJ SOLN
60.0000 mg | Freq: Once | INTRAMUSCULAR | Status: AC
  Administered 2022-01-10: 60 mg via INTRAVENOUS
  Filled 2022-01-10: qty 8

## 2022-01-10 MED ORDER — GUAIFENESIN 100 MG/5ML PO LIQD: 5.0000 mL | ORAL | Status: DC | PRN

## 2022-01-10 MED ORDER — TRAZODONE HCL 50 MG PO TABS
50.0000 mg | ORAL_TABLET | Freq: Every evening | ORAL | Status: DC | PRN
  Administered 2022-01-11 – 2022-01-12 (×2): 50 mg via ORAL
  Filled 2022-01-10 (×3): qty 1

## 2022-01-10 MED ORDER — FUROSEMIDE 10 MG/ML IJ SOLN
40.0000 mg | Freq: Two times a day (BID) | INTRAMUSCULAR | Status: DC
  Administered 2022-01-10: 40 mg via INTRAVENOUS
  Filled 2022-01-10: qty 4

## 2022-01-10 MED ORDER — METOPROLOL TARTRATE 5 MG/5ML IV SOLN
5.0000 mg | Freq: Once | INTRAVENOUS | Status: AC
  Administered 2022-01-10: 5 mg via INTRAVENOUS
  Filled 2022-01-10: qty 5

## 2022-01-10 MED ORDER — METOPROLOL TARTRATE 50 MG PO TABS
75.0000 mg | ORAL_TABLET | Freq: Two times a day (BID) | ORAL | Status: DC
  Administered 2022-01-10 – 2022-01-13 (×7): 75 mg via ORAL
  Filled 2022-01-10 (×6): qty 2
  Filled 2022-01-10: qty 1.5

## 2022-01-10 MED ORDER — HYDRALAZINE HCL 20 MG/ML IJ SOLN: 10.0000 mg | INTRAMUSCULAR | Status: DC | PRN

## 2022-01-10 MED ORDER — ATORVASTATIN CALCIUM 20 MG PO TABS
40.0000 mg | ORAL_TABLET | Freq: Every evening | ORAL | Status: DC
  Administered 2022-01-10 – 2022-01-12 (×3): 40 mg via ORAL
  Filled 2022-01-10 (×3): qty 2

## 2022-01-10 MED ORDER — ONDANSETRON HCL 4 MG/2ML IJ SOLN: 4.0000 mg | Freq: Four times a day (QID) | INTRAMUSCULAR | Status: DC | PRN

## 2022-01-10 MED ORDER — AMIODARONE HCL 200 MG PO TABS
200.0000 mg | ORAL_TABLET | Freq: Two times a day (BID) | ORAL | Status: DC
  Administered 2022-01-10 – 2022-01-13 (×7): 200 mg via ORAL
  Filled 2022-01-10 (×7): qty 1

## 2022-01-10 MED ORDER — VITAMIN B-12 1000 MCG PO TABS
1000.0000 ug | ORAL_TABLET | Freq: Every day | ORAL | Status: DC
  Administered 2022-01-12 – 2022-01-13 (×2): 1000 ug via ORAL
  Filled 2022-01-10 (×4): qty 1

## 2022-01-10 MED ORDER — METOPROLOL TARTRATE 5 MG/5ML IV SOLN: 5.0000 mg | INTRAVENOUS | Status: DC | PRN

## 2022-01-10 MED ORDER — METOPROLOL TARTRATE 50 MG PO TABS
50.0000 mg | ORAL_TABLET | Freq: Once | ORAL | Status: AC
  Administered 2022-01-10: 50 mg via ORAL
  Filled 2022-01-10: qty 1

## 2022-01-10 MED ORDER — SENNOSIDES-DOCUSATE SODIUM 8.6-50 MG PO TABS: 1.0000 | ORAL_TABLET | Freq: Every evening | ORAL | Status: DC | PRN

## 2022-01-10 MED ORDER — APIXABAN 5 MG PO TABS
5.0000 mg | ORAL_TABLET | Freq: Two times a day (BID) | ORAL | Status: DC
  Administered 2022-01-10 – 2022-01-13 (×7): 5 mg via ORAL
  Filled 2022-01-10 (×7): qty 1

## 2022-01-10 MED ORDER — IPRATROPIUM-ALBUTEROL 0.5-2.5 (3) MG/3ML IN SOLN: 3.0000 mL | RESPIRATORY_TRACT | Status: DC | PRN

## 2022-01-10 MED ORDER — DILTIAZEM HCL-DEXTROSE 125-5 MG/125ML-% IV SOLN (PREMIX)
5.0000 mg/h | INTRAVENOUS | Status: DC
  Administered 2022-01-10: 7.5 mg/h via INTRAVENOUS
  Administered 2022-01-10: 5 mg/h via INTRAVENOUS
  Filled 2022-01-10: qty 125

## 2022-01-10 MED ORDER — ALPRAZOLAM 0.5 MG PO TABS
0.5000 mg | ORAL_TABLET | Freq: Three times a day (TID) | ORAL | Status: DC | PRN
  Administered 2022-01-10: 0.5 mg via ORAL
  Filled 2022-01-10: qty 1

## 2022-01-10 MED ORDER — ASPIRIN 81 MG PO TBEC
81.0000 mg | DELAYED_RELEASE_TABLET | Freq: Every day | ORAL | Status: DC
  Administered 2022-01-10 – 2022-01-13 (×4): 81 mg via ORAL
  Filled 2022-01-10 (×4): qty 1

## 2022-01-10 MED ORDER — ACETAMINOPHEN 325 MG PO TABS: 650.0000 mg | ORAL_TABLET | Freq: Four times a day (QID) | ORAL | Status: DC | PRN

## 2022-01-10 NOTE — ED Notes (Signed)
Attempted IV x2, unsuccessful.  

## 2022-01-10 NOTE — Consult Note (Signed)
Andres [name] is a 56 y.o. male  [id number]  Primary Cardiologist: Dr. Lady Gary Reason for Consultation: atrial fibrillation with RVR  HPI: Andres [name] is a 56 y.o. male with past medical history significant for atrial fibrillation on Eliquis, CAD status post PCI, GERD, cirrhosis, nicotine dependence, insomnia who presented to the hospital on 01/10/22 with complaints of shortness of breath and atrial fibrillation with RVR.  Patient was recently hospitalized with atrial fibrillation with RVR and CHF exacerbation.  He underwent cardiac catheterization on 01/06/22 showing nonobstructive CAD and patent previous stents.  Echocardiogram on 10/17 was overall unremarkable.  His metoprolol was increased, started on amiodarone and Eliquis and was discharged home.  He came back again last night with similar symptoms but ended up leaving AMA. He returns back this morning again with complaints of shortness of breath and found to be in atrial fibrillation with RVR with elevated BNP and CHF exacerbation.  Denies any fevers and chills.  Admits of orthopnea and increasing lower extremity swelling.     Review of Systems: denies current chest pain. Continues to be short of breath   Past Medical History:  Diagnosis Date   A-fib (HCC)    CHF (congestive heart failure) (HCC)    Coronary artery disease    History of kidney stones     (Not in a hospital admission)     amiodarone  200 mg Oral BID   apixaban  5 mg Oral BID   aspirin EC  81 mg Oral Daily   atorvastatin  40 mg Oral QPM   [START ON 01/11/2022] cyanocobalamin  1,000 mcg Oral Daily   furosemide  40 mg Intravenous Q12H   metoprolol tartrate  75 mg Oral BID    Infusions:  diltiazem (CARDIZEM) infusion 7.5 mg/hr (01/10/22 0926)    Allergies  Allergen Reactions   Statins Other (See Comments)    Myalgias, cannot remember which statin he took in the past but willing to try atorvastatin 12/2021    Social History   Socioeconomic  History   Marital status: Married    Spouse name: Not on file   Number of children: Not on file   Years of education: Not on file   Highest education level: Not on file  Occupational History   Not on file  Tobacco Use   Smoking status: Every Day    Packs/day: 1.00    Types: Cigarettes   Smokeless tobacco: Not on file  Vaping Use   Vaping Use: Never used  Substance and Sexual Activity   Alcohol use: Not Currently   Drug use: Never   Sexual activity: Not on file  Other Topics Concern   Not on file  Social History Narrative   Not on file   Social Determinants of Health   Financial Resource Strain: Not on file  Food Insecurity: No Food Insecurity (01/10/2022)   Hunger Vital Sign    Worried About Running Out of Food in the Last Year: Never true    Ran Out of Food in the Last Year: Never true  Transportation Needs: No Transportation Needs (01/10/2022)   PRAPARE - Administrator, Civil Service (Medical): No    Lack of Transportation (Non-Medical): No  Physical Activity: Not on file  Stress: Not on file  Social Connections: Not on file  Intimate Partner Violence: Not At Risk (01/10/2022)   Humiliation, Afraid, Rape, and Kick questionnaire    Fear of Current or Ex-Partner: No  Emotionally Abused: No    Physically Abused: No    Sexually Abused: No    History reviewed. No pertinent family history.  PHYSICAL EXAM: Vitals:   01/10/22 1330 01/10/22 1511  BP: (!) 101/92 120/88  Pulse: 79 94  Resp: 18   Temp:    SpO2: 93%      Intake/Output Summary (Last 24 hours) at 01/10/2022 1701 Last data filed at 01/10/2022 1538 Gross per 24 hour  Intake --  Output 700 ml  Net -700 ml    General:  Well appearing. No respiratory difficulty HEENT: normal Neck: supple. no JVD. Carotids 2+ bilat; no bruits. No lymphadenopathy or thryomegaly appreciated. Cor: PMI nondisplaced. Regular rate & rhythm. No rubs, gallops or murmurs. Lungs: clear Abdomen: soft,  nontender, nondistended. No hepatosplenomegaly. No bruits or masses. Good bowel sounds. Extremities: no cyanosis, clubbing, rash, edema Neuro: alert & oriented x 3, cranial nerves grossly intact. moves all 4 extremities w/o difficulty. Affect pleasant.  ECG: atrial fibrillation, HR 129 bpm  Results for orders placed or performed during the hospital encounter of 01/10/22 (from the past 24 hour(s))  Comprehensive metabolic panel     Status: Abnormal   Collection Time: 01/10/22  8:05 AM  Result Value Ref Range   Sodium 138 135 - 145 mmol/L   Potassium 4.1 3.5 - 5.1 mmol/L   Chloride 104 98 - 111 mmol/L   CO2 24 22 - 32 mmol/L   Glucose, Bld 132 (H) 70 - 99 mg/dL   BUN 27 (H) 6 - 20 mg/dL   Creatinine, Ser 1.45 (H) 0.61 - 1.24 mg/dL   Calcium 9.1 8.9 - 10.3 mg/dL   Total Protein 7.5 6.5 - 8.1 g/dL   Albumin 3.7 3.5 - 5.0 g/dL   AST 24 15 - 41 U/L   ALT 45 (H) 0 - 44 U/L   Alkaline Phosphatase 56 38 - 126 U/L   Total Bilirubin 1.3 (H) 0.3 - 1.2 mg/dL   GFR, Estimated 56 (L) >60 mL/min   Anion gap 10 5 - 15  CBC with Differential     Status: Abnormal   Collection Time: 01/10/22  8:05 AM  Result Value Ref Range   WBC 15.4 (H) 4.0 - 10.5 K/uL   RBC 4.93 4.22 - 5.81 MIL/uL   Hemoglobin 14.9 13.0 - 17.0 g/dL   HCT 42.9 39.0 - 52.0 %   MCV 87.0 80.0 - 100.0 fL   MCH 30.2 26.0 - 34.0 pg   MCHC 34.7 30.0 - 36.0 g/dL   RDW 13.3 11.5 - 15.5 %   Platelets 397 150 - 400 K/uL   nRBC 0.0 0.0 - 0.2 %   Neutrophils Relative % 75 %   Neutro Abs 11.5 (H) 1.7 - 7.7 K/uL   Lymphocytes Relative 15 %   Lymphs Abs 2.3 0.7 - 4.0 K/uL   Monocytes Relative 5 %   Monocytes Absolute 0.8 0.1 - 1.0 K/uL   Eosinophils Relative 3 %   Eosinophils Absolute 0.4 0.0 - 0.5 K/uL   Basophils Relative 1 %   Basophils Absolute 0.1 0.0 - 0.1 K/uL   Immature Granulocytes 1 %   Abs Immature Granulocytes 0.19 (H) 0.00 - 0.07 K/uL  Brain natriuretic peptide     Status: Abnormal   Collection Time: 01/10/22  8:05 AM   Result Value Ref Range   B Natriuretic Peptide 1,214.8 (H) 0.0 - 100.0 pg/mL  Troponin I (High Sensitivity)     Status: Abnormal   Collection Time:  01/10/22  8:05 AM  Result Value Ref Range   Troponin I (High Sensitivity) 24 (H) <18 ng/L  TSH     Status: Abnormal   Collection Time: 01/10/22 10:29 AM  Result Value Ref Range   TSH 12.758 (H) 0.350 - 4.500 uIU/mL  T4, free     Status: None   Collection Time: 01/10/22 10:29 AM  Result Value Ref Range   Free T4 0.96 0.61 - 1.12 ng/dL  Resp Panel by RT-PCR (Flu A&B, Covid) Anterior Nasal Swab     Status: None   Collection Time: 01/10/22 10:30 AM   Specimen: Anterior Nasal Swab  Result Value Ref Range   SARS Coronavirus 2 by RT PCR NEGATIVE NEGATIVE   Influenza A by PCR NEGATIVE NEGATIVE   Influenza B by PCR NEGATIVE NEGATIVE  Troponin I (High Sensitivity)     Status: Abnormal   Collection Time: 01/10/22 10:30 AM  Result Value Ref Range   Troponin I (High Sensitivity) 20 (H) <18 ng/L   DG Chest Port 1 View  Result Date: 01/10/2022 CLINICAL DATA:  Shortness breath EXAM: PORTABLE CHEST 1 VIEW COMPARISON:  Chest radiograph 01/09/2022 FINDINGS: No pleural effusion. No pneumothorax. Unchanged cardiac and mediastinal contours. Redemonstrated diffuse interstitial opacities, slightly increased in the bilateral upper lobes compared to prior exam. No displaced rib fractures. Visualized upper abdomen is unremarkable. IMPRESSION: Redemonstrated diffuse interstitial opacities, slightly increased in the bilateral upper lobes compared to prior exam. Findings are favored to represent pulmonary edema, but atypical infection is also a differential consideration Electronically Signed   By: Lorenza Cambridge M.D.   On: 01/10/2022 08:30   DG Chest 2 View  Result Date: 01/10/2022 CLINICAL DATA:  Chest pain EXAM: CHEST - 2 VIEW COMPARISON:  01/05/2022 FINDINGS: Heart is normal size. Peribronchial thickening. Diffuse interstitial prominence throughout the lungs. No  visible significant effusions. No acute bony abnormality. IMPRESSION: Peribronchial thickening with diffuse interstitial prominence could reflect interstitial edema or atypical infection. Electronically Signed   By: Charlett Nose M.D.   On: 01/10/2022 00:09     ASSESSMENT AND PLAN: Patient complains of shortness of breath. Echo 01/07/22 revealed EF 65-70%. Cardiac cath 01/06/22 revealed diffuse disease. Continue Lasix drip for CHF. Currently on Cardizem drip, amiodarone and metoprolol, continue for rate and rhythm control. Will continue to follow.   Museum/gallery conservator FNP-C

## 2022-01-10 NOTE — ED Provider Notes (Signed)
[facility] Provider Note    Event Date/Time   First MD Initiated Contact with Patient 01/10/22 [phone]     (approximate)   History   Chest Pain (Pt c/o chest pain, new onset afib.)   HPI  Andres [name] is a 56 y.o. male with history of recent onset of A-fib on Eliquis, CAD status post PCI, and possible CHF who presents with increased shortness of breath since he was discharged from the hospital a couple of hours ago.  The patient had been admitted last week and discharged on 10/18 for these acute issues.  He came to the hospital last night with shortness of breath and palpitations and was found to be in rapid atrial fibrillation.  He was planned for admission to the hospital service but left AMA early this morning because he was frustrated that his symptoms did not seem to be getting better despite treatment.  He now states he made a mistake by leaving.  I reviewed the past medical records including the discharge summary from 10/18.  The patient had a cath on 10/16 that showed diffuse moderate nonobstructive CAD with patent stents.   Physical Exam   Triage Vital Signs: ED Triage Vitals  Enc Vitals Group     BP 01/10/22 0811 118/82     Pulse Rate 01/10/22 0811 (!) 130     Resp 01/10/22 0826 16     Temp 01/10/22 0811 98.4 F (36.9 C)     Temp Source 01/10/22 0811 Oral     SpO2 01/10/22 0811 93 %     Weight 01/10/22 0812 263 lb 7.2 oz (119.5 kg)     Height 01/10/22 0812 6\' 4"  (1.93 m)     Head Circumference --      Peak Flow --      Pain Score 01/10/22 0812 0     Pain Loc --      Pain Edu? --      Excl. in GC? --     Most recent vital signs: Vitals:   01/10/22 0830 01/10/22 0900  BP: 120/73 117/74  Pulse: (!) 52 91  Resp: (!) 32 (!) 22  Temp:    SpO2: 93% 95%     General: Alert and oriented, comfortable appearing. CV:  Good peripheral perfusion.  Tachycardic, irregular rhythm. Resp:  Normal effort.  Lungs with faint rales. Abd:  No  distention.  Other:  No significant peripheral edema.   ED Results / Procedures / Treatments   Labs (all labs ordered are listed, but only abnormal results are displayed) Labs Reviewed  COMPREHENSIVE METABOLIC PANEL - Abnormal; Notable for the following components:      Result Value   Glucose, Bld 132 (*)    BUN 27 (*)    Creatinine, Ser 1.45 (*)    ALT 45 (*)    Total Bilirubin 1.3 (*)    GFR, Estimated 56 (*)    All other components within normal limits  CBC WITH DIFFERENTIAL/PLATELET - Abnormal; Notable for the following components:   WBC 15.4 (*)    Neutro Abs 11.5 (*)    Abs Immature Granulocytes 0.19 (*)    All other components within normal limits  BRAIN NATRIURETIC PEPTIDE - Abnormal; Notable for the following components:   B Natriuretic Peptide 1,214.8 (*)    All other components within normal limits  TROPONIN I (HIGH SENSITIVITY) - Abnormal; Notable for the following components:   Troponin I (High Sensitivity) 24 (*)  All other components within normal limits  RESP PANEL BY RT-PCR (FLU A&B, COVID) [id number]  TROPONIN I (HIGH SENSITIVITY)     EKG  ED ECG REPORT I, Arta Silence, the attending physician, personally viewed and interpreted this ECG.  Date: 01/10/2022 EKG Time: 0804 Rate: 129 Rhythm: Atrial fibrillation with RVR, PVCs QRS Axis: normal Intervals: normal ST/T Wave abnormalities: normal Narrative Interpretation: no evidence of acute ischemia    RADIOLOGY  Chest x-ray: I independently viewed and interpreted the images; there are bilateral interstitial opacities somewhat worsened from x-ray from last night  PROCEDURES:  Critical Care performed: Yes, see critical care procedure note(s)  .Critical Care  Performed by: Arta Silence, MD Authorized by: Arta Silence, MD   Critical care provider statement:    Critical care time (minutes):  30   Critical care was necessary to treat or prevent imminent or life-threatening  deterioration of the following conditions:  Cardiac failure   Critical care was time spent personally by me on the following activities:  Development of treatment plan with patient or surrogate, discussions with consultants, evaluation of patient's response to treatment, examination of patient, ordering and review of laboratory studies, ordering and review of radiographic studies, ordering and performing treatments and interventions, pulse oximetry, re-evaluation of patient's condition, review of old charts and obtaining history from patient or surrogate   Care discussed with: admitting provider      MEDICATIONS ORDERED IN ED: Medications  diltiazem (CARDIZEM) 125 mg in dextrose 5% 125 mL (1 mg/mL) infusion (7.5 mg/hr Intravenous New Bag/Given 01/10/22 0926)     IMPRESSION / MDM / [place] / ED COURSE  I reviewed the triage vital signs and the nursing notes.  56 year old male with PMH as noted above presents with persistent shortness of breath and palpitations after leaving AMA a few hours ago before a planned admission for atrial fibrillation and CHF.  EMS reported that the patient was hypotensive and was having runs of ventricular tachycardia.  However, on ED arrival the patient had atrial fibrillation with narrow complex on the monitor and EKG with occasional nonsustained runs of ventricular tachycardia of 3-10 beats, and a normal blood pressure.  He was placed on the ZOLL monitor but did not require shock.  Physical exam does not show significant edema.  His vital signs are otherwise stable.  Differential diagnosis includes, but is not limited to, atrial fibrillation, CHF; I do not suspect ACS given the very recent reassuring catheterization.  The patient is persistently in A-fib with a rate of 105-135 so I will put him on a Cardizem infusion, obtain a repeat chest x-ray and labs, and anticipate admission as originally planned this morning.  Patient's presentation is most  consistent with acute presentation with potential threat to life or bodily function.  The patient is on the cardiac monitor to evaluate for evidence of arrhythmia and/or significant heart rate changes.  ----------------------------------------- 10:39 AM on 01/10/2022 -----------------------------------------  Chest x-ray continues show evidence of edema.  BNP is elevated.  Troponin is minimally elevated.  The heart rate has come down to the 90s on the Cardizem drip.  I consulted Dr. Blaine Hamper from the hospitalist service; based on our discussion he agrees to admit the patient.  I also contacted cardiology NP [place].   FINAL CLINICAL IMPRESSION(S) / ED DIAGNOSES   Final diagnoses:  Atrial fibrillation with RVR (HCC)  Congestive heart failure, unspecified HF chronicity, unspecified heart failure type ([place])     Rx / DC Orders  ED Discharge Orders     None        Note:  This document was prepared using Dragon voice recognition software and may include unintentional dictation errors.    Dionne Bucy, MD 01/10/22 1040

## 2022-01-10 NOTE — ED Notes (Signed)
D/C'd Cardizem drip at this tome. Pt's HR maintaining in 80's with A-Fib. Will continue to assess need to restart medication per provider order.

## 2022-01-10 NOTE — ED Notes (Signed)
Pt given afternoon meds and tolerated well.

## 2022-01-10 NOTE — ED Triage Notes (Signed)
Pt arrived by EMS, was in ED earlier this AM for chest pain . Unable to stay, got home felt weak. EMS reports afib RVR 40-135 for HR. IV started NS bolus given. Pt arrives, MD at bedside, alert and oriented.

## 2022-01-10 NOTE — ED Notes (Signed)
Family requesting update from [facility]. He did call and gave plan of care. Bladder scan results 161 ML urine. Pt remains at bedside with  intermittent feelings of "stomach breathing" MD is aware and addressed during call.

## 2022-01-10 NOTE — ED Provider Notes (Signed)
[facility] Provider Note    Event Date/Time   First MD Initiated Contact with Patient 01/10/22 0400     (approximate)   History   Shortness of Breath   HPI  Andres [name] is a 56 y.o. male who presents to the ED for evaluation of Shortness of Breath   I reviewed medical DC summary from 10/18.  History of CAD s/p PCI.  A-fib on Eliquis.  Left heart cath on 10/16 diffuse moderate nonobstructive CAD.  Normal EF.  Started on 20mg  qd Lasix for CHF.  Patient presents to the ED for evaluation of continued shortness of breath and palpitations.  He reports intermittent palpitations and concerns for A-fib.  Reports increasing dyspnea, dyspnea on exertion and orthopnea.  No fevers.  No syncope.  Mild lower extremity edema.   Physical Exam   Triage Vital Signs: ED Triage Vitals  Enc Vitals Group     BP 01/09/22 2352 (!) 139/106     Pulse Rate 01/09/22 2352 (!) 108     Resp 01/09/22 2352 20     Temp 01/09/22 2352 (!) 97 F (36.1 C)     Temp Source 01/09/22 2352 Axillary     SpO2 01/09/22 2352 96 %     Weight 01/09/22 2338 248 lb (112.5 kg)     Height 01/09/22 2338 6' (1.829 m)     Head Circumference --      Peak Flow --      Pain Score 01/09/22 2338 6     Pain Loc --      Pain Edu? --      Excl. in GC? --     Most recent vital signs: Vitals:   01/10/22 0530 01/10/22 0600  BP: (!) 132/97 121/73  Pulse: 95 (!) 113  Resp: (!) 25 (!) 28  Temp:    SpO2: 94% 95%    General: Awake, no distress.  Looks systemically well, conversational full sentences. CV:  Good peripheral perfusion.  Tachycardic and irregular Resp:  Normal effort.  Mild tachypnea to the mid 20s.  No distress.  No wheezing. Abd:  No distention.  Soft and benign MSK:  No deformity noted.  Trace bilateral lower extremity edema.  No overlying skin changes. Neuro:  No focal deficits appreciated. Other:     ED Results / Procedures / Treatments   Labs (all labs ordered are listed,  but only abnormal results are displayed) Labs Reviewed  CBC - Abnormal; Notable for the following components:      Result Value   WBC 13.5 (*)    All other components within normal limits  BASIC METABOLIC PANEL - Abnormal; Notable for the following components:   CO2 20 (*)    Glucose, Bld 116 (*)    BUN 23 (*)    Creatinine, Ser 1.31 (*)    All other components within normal limits  BRAIN NATRIURETIC PEPTIDE - Abnormal; Notable for the following components:   B Natriuretic Peptide 1,269.6 (*)    All other components within normal limits  TROPONIN I (HIGH SENSITIVITY) - Abnormal; Notable for the following components:   Troponin I (High Sensitivity) 24 (*)    All other components within normal limits  TROPONIN I (HIGH SENSITIVITY) - Abnormal; Notable for the following components:   Troponin I (High Sensitivity) 21 (*)    All other components within normal limits    EKG A-fib with a rate of 106 bpm.  Normal axis.  No STEMI.  With pulmonary vascular congestion without pleural effusion  RADIOLOGY 2 view CXR interpreted by me  Official radiology report(s): DG Chest 2 View  Result Date: 01/10/2022 CLINICAL DATA:  Chest pain EXAM: CHEST - 2 VIEW COMPARISON:  01/05/2022 FINDINGS: Heart is normal size. Peribronchial thickening. Diffuse interstitial prominence throughout the lungs. No visible significant effusions. No acute bony abnormality. IMPRESSION: Peribronchial thickening with diffuse interstitial prominence could reflect interstitial edema or atypical infection. Electronically Signed   By: Charlett Nose M.D.   On: 01/10/2022 00:09    PROCEDURES and INTERVENTIONS:  .1-3 Lead EKG Interpretation  Performed by: Delton Prairie, MD Authorized by: Delton Prairie, MD     Interpretation: abnormal     ECG rate:  121   ECG rate assessment: tachycardic     Rhythm: atrial fibrillation     Ectopy: none     Conduction: normal   .Critical Care  Performed by: Delton Prairie, MD Authorized by:  Delton Prairie, MD   Critical care provider statement:    Critical care time (minutes):  30   Critical care time was exclusive of:  Separately billable procedures and treating other patients   Critical care was necessary to treat or prevent imminent or life-threatening deterioration of the following conditions:  Cardiac failure and circulatory failure   Critical care was time spent personally by me on the following activities:  Development of treatment plan with patient or surrogate, discussions with consultants, evaluation of patient's response to treatment, examination of patient, ordering and review of laboratory studies, ordering and review of radiographic studies, ordering and performing treatments and interventions, pulse oximetry, re-evaluation of patient's condition and review of old charts   Medications  furosemide (LASIX) injection 60 mg (60 mg Intravenous Given 01/10/22 0514)  metoprolol tartrate (LOPRESSOR) injection 5 mg (5 mg Intravenous Given 01/10/22 0514)  metoprolol tartrate (LOPRESSOR) tablet 50 mg (50 mg Oral Given 01/10/22 0513)     IMPRESSION / MDM / ASSESSMENT AND PLAN / ED COURSE  I reviewed the triage vital signs and the nursing notes.  Differential diagnosis includes, but is not limited to, ACS, PTX, PNA, muscle strain/spasm, PE, dissection  {Patient presents with symptoms of an acute illness or injury that is potentially life-threatening.  56 year old male presents to the ED with evidence of CHF exacerbation and A-fib with RVR, ultimately demanding discharge to go to a different facility.  He is tachycardic in A-fib with RVR but hemodynamically stable, no distress or indications for BiPAP.  CXR is congested without lobar infiltrate.  EKG with rapid A-fib.  His BNP is elevated and the highest it has been in the past few days.  I planned to initiate diuresis, rate control and admit to medicine.  But as below, around this time of the admission process he calls out and  demands discharge.  He looks well and has capacity to make this decision.  Acknowledging risks of doing this.  He ambulates out of the ED in no acute distress with his wife.  Clinical Course as of 01/10/22 1610  Fri Jan 10, 2022  0619 After consult with the hospitalist for admission, but prior to patient going upstairs, being seen by hospitalist or all the admission orders going in patient tells the nurse that he wants to leave and go to [facility].  I immediately go to the bedside and reassess the patient.  He is still in A-fib with RVR, normal blood pressure, sitting upright on the edge of the bed and looks systemically well.  He  says that he "needs a change" and is requesting to leave to go to [facility].  I urged him against this and educate him that this will cause a significant delay in his management.  I recommend that he stay here.  I tried asking questions to better understand why he wants to leave, but he is adamant that he "needs a change because I been here twice in the past couple weeks and you have not fixed to me."  He has capacity to make this decision and acknowledges the risks of leaving prior to finishing complete meds and continued A-fib with RVR, decompensation of cardiopulmonary status, disability and death.  I update the hospitalist of this. [DS]    Clinical Course User Index [DS] Vladimir Crofts, MD     FINAL CLINICAL IMPRESSION(S) / ED DIAGNOSES   Final diagnoses:  Atrial fibrillation with RVR ([place])     Rx / DC Orders   ED Discharge Orders     None        Note:  This document was prepared using Dragon voice recognition software and may include unintentional dictation errors.   Vladimir Crofts, MD 01/10/22 [id number]

## 2022-01-10 NOTE — ED Notes (Signed)
Pt continues to be addiment that he would like to leave AMA. Current heart rate is 122. IV removed at this time. AMA form signed by pt.

## 2022-01-10 NOTE — H&P (Addendum)
History and Physical    Andres [name] [id number] DOB: [birth year] DOA: 01/10/2022  PCP: [facility] Patient coming from: Home  Chief Complaint: Home  HPI: Andres [name] is a 56 y.o. male with medical history significant of atrial fibrillation on Eliquis, CAD status post PCI, GERD, cirrhosis, nicotine dependence, insomnia comes to the hospital with complaints of shortness of breath and atrial fibrillation with RVR.  Patient was recently hospitalized couple of times with atrial fibrillation with RVR and CHF exacerbation.  He went and underwent cardiac catheterization on 10/16 showing nonobstructive CAD and patent previous stents.  Echocardiogram on 10/17 was overall unremarkable.  His metoprolol was increased, started on amiodarone and Eliquis and was discharged home.  He came back again last night with similar symptoms but ended up leaving AMA. He returns back this morning again with complaints of shortness of breath and found to be in atrial fibrillation with RVR with elevated BNP and CHF exacerbation.  Denies any fevers and chills.  Admits of orthopnea and increasing lower extremity swelling.  In the ED today he has elevated BNP, chest x-ray showing congestion.  He was started on Cardizem drip and IV Lasix.  Cardiology team was consulted and medical team was requested to admit the patient.   Review of Systems: As per HPI otherwise 10 point review of systems negative.  Review of Systems Otherwise negative except as per HPI, including: General: Denies fever, chills, night sweats or unintended weight loss. Resp: Denies hemoptysis Cardiac: Denies chest pain, palpitations, orthopnea, paroxysmal nocturnal dyspnea. GI: Denies abdominal pain, nausea, vomiting, diarrhea or constipation GU: Denies dysuria, frequency, hesitancy or incontinence MS: Denies muscle aches, joint pain or swelling Neuro: Denies headache, neurologic deficits (focal weakness, numbness, tingling), abnormal  gait Psych: Denies anxiety, depression, SI/HI/AVH Skin: Denies new rashes or lesions ID: Denies sick contacts, exotic exposures, travel  Past Medical History:  Diagnosis Date   A-fib ([place])    CHF (congestive heart failure) ([place])    Coronary artery disease    History of kidney stones     Past Surgical History:  Procedure Laterality Date   LEFT HEART CATH AND CORONARY ANGIOGRAPHY N/A 01/06/2022   Procedure: LEFT HEART CATH AND CORONARY ANGIOGRAPHY possible PCI and stent;  Surgeon: Isaias Cowman, MD;  Location: [facility];  Service: Cardiovascular;  Laterality: N/A;    SOCIAL HISTORY:  reports that he has been smoking cigarettes. He has been smoking an average of 1 pack per day. He does not have any smokeless tobacco history on file. He reports that he does not currently use alcohol. He reports that he does not use drugs.  Allergies  Allergen Reactions   Statins Other (See Comments)    Myalgias, cannot remember which statin he took in the past but willing to try atorvastatin 12/2021    FAMILY HISTORY: History reviewed. No pertinent family history.   Prior to Admission medications   Medication Sig Start Date End Date Taking? Authorizing Provider  amiodarone (PACERONE) 200 MG tablet Take 1 tablet (200 mg total) by mouth 2 (two) times daily. Until Friday, then start taking once daily 01/08/22 02/07/22 Yes Lorella Nimrod, MD  apixaban (ELIQUIS) 5 MG TABS tablet Take 1 tablet (5 mg total) by mouth 2 (two) times daily. 01/04/22 02/03/22 Yes Nolberto Hanlon, MD  aspirin EC 81 MG tablet Take 1 tablet (81 mg total) by mouth daily. Swallow whole. 01/05/22  Yes Nolberto Hanlon, MD  atorvastatin (LIPITOR) 40 MG tablet Take 1 tablet (  40 mg total) by mouth every evening. 01/08/22  Yes Lorella Nimrod, MD  Cyanocobalamin 1000 MCG TBCR Take 1 tablet by mouth daily. 02/06/20  Yes [provider]  furosemide (LASIX) 20 MG tablet Take 1 tablet (20 mg total) by mouth daily. 01/09/22   Yes Lorella Nimrod, MD  metoprolol tartrate 75 MG TABS Take 75 mg by mouth 2 (two) times daily. 01/08/22  Yes Lorella Nimrod, MD  nitroGLYCERIN (NITROSTAT) 0.4 MG SL tablet 1 TABLET UNDER TONGUE AT ONSET OF CHEST PAIN. REPEAT IN 5 MIN IF NOT RESOLVED, MAX 3 DOSES. 911 IF NEEDED. 01/20/19   [provider]    Physical Exam: Vitals:   01/10/22 [id number] 01/10/22 0826 01/10/22 0830 01/10/22 0900  BP:  125/79 120/73 117/74  Pulse:  99 (!) 52 91  Resp:  16 (!) 32 (!) 22  Temp:      TempSrc:      SpO2:  94% 93% 95%  Weight: 119.5 kg     Height: 6\' 4"  (1.93 m)         Constitutional: NAD, calm, comfortable, 2 L nasal cannula Eyes: PERRL, lids and conjunctivae normal ENMT: Mucous membranes are moist. Posterior pharynx clear of any exudate or lesions.Normal dentition.  Neck: normal, supple, no masses, no thyromegaly Respiratory: diffuse diminished breath sounds Cardiovascular: Irregularly irregular heart rate, no murmurs / rubs / gallops.  2+ bilateral lower extremity pitting edema. 2+ pedal pulses. No carotid bruits.  Abdomen: no tenderness, no masses palpated. No hepatosplenomegaly. Bowel sounds positive.  Musculoskeletal: no clubbing / cyanosis. No joint deformity upper and lower extremities. Good ROM, no contractures. Normal muscle tone.  Skin: no rashes, lesions, ulcers. No induration Neurologic: CN 2-12 grossly intact. Sensation intact, DTR normal. Strength 5/5 in all 4.  Psychiatric: Normal judgment and insight. Alert and oriented x 3. Normal mood.     Labs on Admission: I have personally reviewed following labs and imaging studies  CBC: Recent Labs  Lab 01/05/22 0400 01/06/22 0532 01/09/22 2352 01/10/22 0805  WBC 18.7* 11.9* 13.5* 15.4*  NEUTROABS  --   --   --  11.5*  HGB 15.9 13.7 14.6 14.9  HCT 45.3 39.9 43.1 42.9  MCV 86.0 86.0 86.2 87.0  PLT 297 251 367 [id number]   Basic Metabolic Panel: Recent Labs  Lab 01/05/22 2013 01/06/22 0532 01/07/22 0342 01/08/22 0902  01/09/22 2352 01/10/22 0805  NA 138 137 137  --  137 138  K 3.9 3.8 3.5 3.6 4.1 4.1  CL 102 103 104  --  109 104  CO2 27 26 23   --  20* 24  GLUCOSE 104* 114* 106*  --  116* 132*  BUN 20 23* 27*  --  23* 27*  CREATININE 1.14 1.17 1.19  --  1.31* 1.45*  CALCIUM 9.1 8.7* 8.5*  --  9.4 9.1  MG 2.2  --   --  2.1  --   --    GFR: Estimated Creatinine Clearance: 79.4 mL/min (A) (by C-G formula based on SCr of 1.45 mg/dL (H)). Liver Function Tests: Recent Labs  Lab 01/10/22 0805  AST 24  ALT 45*  ALKPHOS 56  BILITOT 1.3*  PROT 7.5  ALBUMIN 3.7   No results for input(s): "LIPASE", "AMYLASE" in the last 168 hours. No results for input(s): "AMMONIA" in the last 168 hours. Coagulation Profile: No results for input(s): "INR", "PROTIME" in the last 168 hours. Cardiac Enzymes: No results for input(s): "CKTOTAL", "CKMB", "CKMBINDEX", "TROPONINI" in the last 168  hours. BNP (last 3 results) No results for input(s): "PROBNP" in the last 8760 hours. HbA1C: No results for input(s): "HGBA1C" in the last 72 hours. CBG: No results for input(s): "GLUCAP" in the last 168 hours. Lipid Profile: No results for input(s): "CHOL", "HDL", "LDLCALC", "TRIG", "CHOLHDL", "LDLDIRECT" in the last 72 hours. Thyroid Function Tests: No results for input(s): "TSH", "T4TOTAL", "FREET4", "T3FREE", "THYROIDAB" in the last 72 hours. Anemia Panel: No results for input(s): "VITAMINB12", "FOLATE", "FERRITIN", "TIBC", "IRON", "RETICCTPCT" in the last 72 hours. Urine analysis:    Component Value Date/Time   COLORURINE YELLOW (A) 01/06/2022 1650   APPEARANCEUR CLEAR (A) 01/06/2022 1650   APPEARANCEUR Clear 07/15/2013 0505   LABSPEC 1.014 01/06/2022 1650   LABSPEC 1.009 07/15/2013 0505   PHURINE 6.0 01/06/2022 1650   GLUCOSEU NEGATIVE 01/06/2022 1650   GLUCOSEU Negative 07/15/2013 0505   HGBUR LARGE (A) 01/06/2022 1650   BILIRUBINUR NEGATIVE 01/06/2022 1650   BILIRUBINUR Negative 07/15/2013 0505   KETONESUR  NEGATIVE 01/06/2022 1650   PROTEINUR NEGATIVE 01/06/2022 1650   NITRITE NEGATIVE 01/06/2022 1650   LEUKOCYTESUR NEGATIVE 01/06/2022 1650   LEUKOCYTESUR Negative 07/15/2013 0505   Sepsis Labs: !!!!!!!!!!!!!!!!!!!!!!!!!!!!!!!!!!!!!!!!!!!! @LABRCNTIP (procalcitonin:4,lacticidven:4) ) Recent Results (from the past 240 hour(s))  Blood culture (single)     Status: None   Collection Time: 01/02/22 12:05 AM   Specimen: BLOOD LEFT HAND  Result Value Ref Range Status   Specimen Description BLOOD LEFT HAND  Final   Special Requests   Final    BOTTLES DRAWN AEROBIC AND ANAEROBIC Blood Culture adequate volume   Culture   Final    NO GROWTH 5 DAYS Performed at [facility], [address] [place], [place] [address]    Report Status 01/08/2022 FINAL  Final  Resp Panel by RT-PCR (Flu A&B, Covid) Anterior Nasal Swab     Status: None   Collection Time: 01/05/22  4:04 AM   Specimen: Anterior Nasal Swab  Result Value Ref Range Status   SARS Coronavirus 2 by RT PCR NEGATIVE NEGATIVE Final    Comment: (NOTE) SARS-CoV-2 target nucleic acids are NOT DETECTED.  The SARS-CoV-2 RNA is generally detectable in upper respiratory specimens during the acute phase of infection. The lowest concentration of SARS-CoV-2 viral copies this assay can detect is 138 copies/mL. A negative result does not preclude SARS-Cov-2 infection and should not be used as the sole basis for treatment or other patient management decisions. A negative result may occur with  improper specimen collection/handling, submission of specimen other than nasopharyngeal swab, presence of viral mutation(s) within the areas targeted by this assay, and inadequate number of viral copies(<138 copies/mL). A negative result must be combined with clinical observations, patient history, and epidemiological information. The expected result is Negative.  Fact Sheet for Patients:  EntrepreneurPulse.com.au  Fact Sheet for  Healthcare Providers:  IncredibleEmployment.be  This test is no t yet approved or cleared by the [place] FDA and  has been authorized for detection and/or diagnosis of SARS-CoV-2 by FDA under an Emergency Use Authorization (EUA). This EUA will remain  in effect (meaning this test can be used) for the duration of the COVID-19 declaration under Section 564(b)(1) of the Act, 21 U.S.C.section 360bbb-3(b)(1), unless the authorization is terminated  or revoked sooner.       Influenza A by PCR NEGATIVE NEGATIVE Final   Influenza B by PCR NEGATIVE NEGATIVE Final    Comment: (NOTE) The Xpert Xpress SARS-CoV-2/FLU/RSV plus assay is intended as an aid in the diagnosis of  influenza from Nasopharyngeal swab specimens and should not be used as a sole basis for treatment. Nasal washings and aspirates are unacceptable for Xpert Xpress SARS-CoV-2/FLU/RSV testing.  Fact Sheet for Patients: EntrepreneurPulse.com.au  Fact Sheet for Healthcare Providers: IncredibleEmployment.be  This test is not yet approved or cleared by the [place] FDA and has been authorized for detection and/or diagnosis of SARS-CoV-2 by FDA under an Emergency Use Authorization (EUA). This EUA will remain in effect (meaning this test can be used) for the duration of the COVID-19 declaration under Section 564(b)(1) of the Act, 21 U.S.C. section 360bbb-3(b)(1), unless the authorization is terminated or revoked.  Performed at [facility], [address], [place], [place] [address]   Urine Culture     Status: None   Collection Time: 01/06/22  4:50 PM   Specimen: Urine, Clean Catch  Result Value Ref Range Status   Specimen Description   Final    URINE, CLEAN CATCH Performed at [facility], [address] [place], [place] [address]    Special Requests   Final    NONE Performed at [facility], [address], [place],  [place] [address]    Culture   Final    NO GROWTH Performed at [facility], [place] [address] [place], [place] [address]    Report Status 01/07/2022 FINAL  Final  Resp Panel by RT-PCR (Flu A&B, Covid) Anterior Nasal Swab     Status: None   Collection Time: 01/10/22 10:30 AM   Specimen: Anterior Nasal Swab  Result Value Ref Range Status   SARS Coronavirus 2 by RT PCR NEGATIVE NEGATIVE Final    Comment: (NOTE) SARS-CoV-2 target nucleic acids are NOT DETECTED.  The SARS-CoV-2 RNA is generally detectable in upper respiratory specimens during the acute phase of infection. The lowest concentration of SARS-CoV-2 viral copies this assay can detect is 138 copies/mL. A negative result does not preclude SARS-Cov-2 infection and should not be used as the sole basis for treatment or other patient management decisions. A negative result may occur with  improper specimen collection/handling, submission of specimen other than nasopharyngeal swab, presence of viral mutation(s) within the areas targeted by this assay, and inadequate number of viral copies(<138 copies/mL). A negative result must be combined with clinical observations, patient history, and epidemiological information. The expected result is Negative.  Fact Sheet for Patients:  EntrepreneurPulse.com.au  Fact Sheet for Healthcare Providers:  IncredibleEmployment.be  This test is no t yet approved or cleared by the [place] FDA and  has been authorized for detection and/or diagnosis of SARS-CoV-2 by FDA under an Emergency Use Authorization (EUA). This EUA will remain  in effect (meaning this test can be used) for the duration of the COVID-19 declaration under Section 564(b)(1) of the Act, 21 U.S.C.section 360bbb-3(b)(1), unless the authorization is terminated  or revoked sooner.       Influenza A by PCR NEGATIVE NEGATIVE Final   Influenza B by PCR NEGATIVE NEGATIVE Final    Comment:  (NOTE) The Xpert Xpress SARS-CoV-2/FLU/RSV plus assay is intended as an aid in the diagnosis of influenza from Nasopharyngeal swab specimens and should not be used as a sole basis for treatment. Nasal washings and aspirates are unacceptable for Xpert Xpress SARS-CoV-2/FLU/RSV testing.  Fact Sheet for Patients: EntrepreneurPulse.com.au  Fact Sheet for Healthcare Providers: IncredibleEmployment.be  This test is not yet approved or cleared by the [place] FDA and has been authorized for detection and/or diagnosis of SARS-CoV-2 by FDA under  an Emergency Use Authorization (EUA). This EUA will remain in effect (meaning this test can be used) for the duration of the COVID-19 declaration under Section 564(b)(1) of the Act, 21 U.S.C. section 360bbb-3(b)(1), unless the authorization is terminated or revoked.  Performed at [facility], [address], [place],  [address]      Radiological Exams on Admission: DG Chest [facility] 1 View  Result Date: 01/10/2022 CLINICAL DATA:  Shortness breath EXAM: PORTABLE CHEST 1 VIEW COMPARISON:  Chest radiograph 01/09/2022 FINDINGS: No pleural effusion. No pneumothorax. Unchanged cardiac and mediastinal contours. Redemonstrated diffuse interstitial opacities, slightly increased in the bilateral upper lobes compared to prior exam. No displaced rib fractures. Visualized upper abdomen is unremarkable. IMPRESSION: Redemonstrated diffuse interstitial opacities, slightly increased in the bilateral upper lobes compared to prior exam. Findings are favored to represent pulmonary edema, but atypical infection is also a differential consideration Electronically Signed   By: Marin Roberts M.D.   On: 01/10/2022 08:30   DG Chest 2 View  Result Date: 01/10/2022 CLINICAL DATA:  Chest pain EXAM: CHEST - 2 VIEW COMPARISON:  01/05/2022 FINDINGS: Heart is normal size. Peribronchial thickening. Diffuse interstitial prominence  throughout the lungs. No visible significant effusions. No acute bony abnormality. IMPRESSION: Peribronchial thickening with diffuse interstitial prominence could reflect interstitial edema or atypical infection. Electronically Signed   By: Rolm Baptise M.D.   On: 01/10/2022 00:09     All images have been reviewed by me personally.  EKG: Independently reviewed.  Atrial fibrillation  Assessment/Plan Principal Problem:   Atrial fibrillation with RVR (HCC) Active Problems:   CHF (congestive heart failure) (HCC)   CAD S/P percutaneous coronary angioplasty   Acute respiratory failure with hypoxia (HCC)   Atrial fibrillation with RVR - Admit patient to progressive unit.  Currently on Cardizem drip, resume home amiodarone, metoprolol 75 mg twice daily, Eliquis.  Recent TSH elevated, will check free T4.  Cardiology team has been consulted.  Wonder if patient would benefit from cardioversion versus ablation.  Will defer this to cardiology team.  Acute congestive heart failure with preserved ejection fraction.  Class IV Acute hypoxic respiratory distress - Currently on Lasix 40 mg IV twice daily.  Continue home metoprolol.  Recent echocardiogram showed preserved EF.  Monitor electrolytes, fluid restriction, strict input and output.  ADDENDUM AT 530PM POOR RESPONSE TO LASIX, WILL ORDER BUMEX 2MG  IV BID UNLESS IF CARDIOLOGY PLANS TO START LASIX DRIP.   CAD status post PCI - Continue aspirin, Eliquis, statin, beta-blocker.  Leukocytosis - Chronic.  No obvious evidence of infection   DVT prophylaxis: Eliquis Code Status: Full code Family Communication: Wife at bedside Consults called: Cardiology Admission status: Admit to progressive care  Status is: Inpatient Remains inpatient appropriate because: Currently atrial fibrillation with RVR along with CHF exacerbation   Time Spent: 65 minutes.  >50% of the time was devoted to discussing the patients care, assessment, plan and disposition with  other care givers along with counseling the patient about the risks and benefits of treatment.    Eldean Klatt Arsenio Loader MD [facility]  If 7PM-7AM, please contact night-coverage   01/10/2022, 12:09 PM

## 2022-01-10 NOTE — ED Notes (Signed)
Went to room, pt states "I think we're going to sign out and go to [facility]". This explained that we are doing everything we can but I will have to provider come speak with them.

## 2022-01-11 DIAGNOSIS — I4891 Unspecified atrial fibrillation: Secondary | ICD-10-CM | POA: Diagnosis not present

## 2022-01-11 LAB — CBC
HCT: 41 % (ref 39.0–52.0)
Hemoglobin: 14.3 g/dL (ref 13.0–17.0)
MCH: 30 pg (ref 26.0–34.0)
MCHC: 34.9 g/dL (ref 30.0–36.0)
MCV: 86 fL (ref 80.0–100.0)
Platelets: 310 10*3/uL (ref 150–400)
RBC: 4.77 MIL/uL (ref 4.22–5.81)
RDW: 13.1 % (ref 11.5–15.5)
WBC: 11.4 10*3/uL — ABNORMAL HIGH (ref 4.0–10.5)
nRBC: 0 % (ref 0.0–0.2)

## 2022-01-11 LAB — BASIC METABOLIC PANEL
Anion gap: 9 (ref 5–15)
BUN: 28 mg/dL — ABNORMAL HIGH (ref 6–20)
CO2: 25 mmol/L (ref 22–32)
Calcium: 8.8 mg/dL — ABNORMAL LOW (ref 8.9–10.3)
Chloride: 102 mmol/L (ref 98–111)
Creatinine, Ser: 1.37 mg/dL — ABNORMAL HIGH (ref 0.61–1.24)
GFR, Estimated: >60 mL/min (ref >60)
Glucose, Bld: 130 mg/dL — ABNORMAL HIGH (ref 70–99)
Potassium: 3.4 mmol/L — ABNORMAL LOW (ref 3.5–5.1)
Sodium: 136 mmol/L (ref 135–145)

## 2022-01-11 LAB — MAGNESIUM: Magnesium: 2.1 mg/dL (ref 1.7–2.4)

## 2022-01-11 MED ORDER — METOLAZONE 5 MG PO TABS
5.0000 mg | ORAL_TABLET | Freq: Every day | ORAL | Status: DC
  Administered 2022-01-12 – 2022-01-13 (×2): 5 mg via ORAL
  Filled 2022-01-11 (×2): qty 1

## 2022-01-11 MED ORDER — METOLAZONE 5 MG PO TABS
5.0000 mg | ORAL_TABLET | Freq: Once | ORAL | Status: AC
  Administered 2022-01-11: 5 mg via ORAL
  Filled 2022-01-11: qty 1

## 2022-01-11 MED ORDER — FUROSEMIDE 10 MG/ML IJ SOLN
80.0000 mg | Freq: Once | INTRAMUSCULAR | Status: AC
  Administered 2022-01-11: 80 mg via INTRAVENOUS
  Filled 2022-01-11: qty 8

## 2022-01-11 MED ORDER — POTASSIUM CHLORIDE CRYS ER 20 MEQ PO TBCR
40.0000 meq | EXTENDED_RELEASE_TABLET | Freq: Once | ORAL | Status: AC
  Administered 2022-01-11: 40 meq via ORAL
  Filled 2022-01-11: qty 2

## 2022-01-11 MED ORDER — FUROSEMIDE 10 MG/ML IJ SOLN
80.0000 mg | Freq: Every day | INTRAMUSCULAR | Status: DC
  Administered 2022-01-12: 80 mg via INTRAVENOUS
  Filled 2022-01-11 (×2): qty 8

## 2022-01-11 NOTE — ED Notes (Signed)
Pt up in bathroom brushing teeth. Pt requesting to go walk the halls. HR 85-105 irregular. This RN told pt would like pt to rest some and for HR to stay below 100

## 2022-01-11 NOTE — ED Notes (Signed)
Pt given coffee as requested.

## 2022-01-11 NOTE — Progress Notes (Signed)
  PROGRESS NOTE    Andres [name]  HTM:[id number] DOB: 05-Feb-1966 DOA: 01/10/2022 PCP: [facility]  [id number]  LOS: 1 day   Brief hospital course:   Assessment & Plan: Andres [name] is a 56 y.o. male with medical history significant of atrial fibrillation on Eliquis, CAD status post PCI, cirrhosis, nicotine dependence presented to the hospital with complaints of shortness of breath and atrial fibrillation with RVR.   Patient was recently hospitalized couple of times with atrial fibrillation with RVR and CHF exacerbation.  He went and underwent cardiac catheterization on 10/16 showing nonobstructive CAD and patent previous stents.  Echocardiogram on 10/17 was overall unremarkable.  His metoprolol was increased, started on amiodarone and Eliquis and was discharged home.     Atrial fibrillation with RVR - was started on dilt gtt, which was weaned off the same day.   --cardiology consulted --TSH elevated, but free T4 wnl. Plan: --cont amio and Lopressor --cont Eliquis --consider cardioversion    Acute congestive heart failure  --has normal systolic and diastolic function from last Echo on 01/07/22.  Heart failure 2/2 Afib w RVR.  CXR showed findings suggesting pulm edema. --reportedly did not respond well to IV lasix 40 mg, so IV lasix increased to 80 mg with added metolazone today with good urine output. Plan: --metolazone and IV lasix 80 mg daily --monitor Cr while diuresing   CAD status post PCI - Continue aspirin, statin, beta-blocker.   Leukocytosis - Chronic.  No obvious evidence of infection   DVT prophylaxis: [id number] Code Status: Full code  Family Communication:  Level of care: Progressive Dispo:   The patient is from: home Anticipated d/c is to: home Anticipated d/c date is: 1-2 days Patient currently is not medically ready to d/c due to: IV lasix, consider cardioversion   Subjective and Interval History:  HR better controlled this morning.   Pt reported feeling better.   Objective: Vitals:   01/11/22 1426 01/11/22 1429 01/11/22 1430 01/11/22 1700  BP: (!) 132/98   128/88  Pulse: 87 87 97 82  Resp: 17 14  16   Temp:   98.2 F (36.8 C) 98.7 F (37.1 C)  TempSrc:    Oral  SpO2: 93% 94%  95%  Weight:      Height:        Intake/Output Summary (Last 24 hours) at 01/11/2022 1803 Last data filed at 01/11/2022 1700 Gross per 24 hour  Intake 50 ml  Output 2850 ml  Net -2800 ml   Filed Weights   01/10/22 0812  Weight: 119.5 kg    Examination:   Constitutional: NAD, AAOx3 HEENT: conjunctivae and lids normal, EOMI CV: No cyanosis.   RESP: normal respiratory effort, on RA Neuro: II - XII grossly intact.   Psych: Normal mood and affect.  Appropriate judgement and reason   Data Reviewed: I have personally reviewed labs and imaging studies  Time spent: 50 minutes  Enzo Bi, MD [facility] If 7PM-7AM, please contact night-coverage 01/11/2022, 6:03 PM

## 2022-01-11 NOTE — Progress Notes (Signed)
SUBJECTIVE: Andres [name] is a 56 y.o. male with past medical history significant for atrial fibrillation on Eliquis, CAD status post PCI, GERD, cirrhosis, nicotine dependence, insomnia who presented to the hospital on 01/10/22 with complaints of shortness of breath and atrial fibrillation with RVR.  Patient was recently hospitalized with atrial fibrillation with RVR and CHF exacerbation.  He underwent cardiac catheterization on 01/06/22 showing nonobstructive CAD and patent previous stents.  Echocardiogram on 10/17 was overall unremarkable.  His metoprolol was increased, started on amiodarone and Eliquis and was discharged home.  He came back again 2 days ago with similar symptoms but ended up leaving AMA.  He returned back yesterday morning again with complaints of shortness of breath and found to be in atrial fibrillation with RVR with elevated BNP and CHF exacerbation.  Denies any chest pain, fevers and chills.  Admits of orthopnea and increasing lower extremity swelling.   Vitals:   01/11/22 0800 01/11/22 1015 01/11/22 1028 01/11/22 1035  BP: (!) 134/101 (!) 132/110    Pulse: 82 78 97   Resp: 11 20    Temp:    98.5 F (36.9 C)  TempSrc:      SpO2: 94% 99%    Weight:      Height:        Intake/Output Summary (Last 24 hours) at 01/11/2022 1106 Last data filed at 01/11/2022 0729 Gross per 24 hour  Intake 50 ml  Output 1300 ml  Net -1250 ml    LABS: Basic Metabolic Panel: Recent Labs    01/10/22 0805 01/11/22 0528  NA 138 136  K 4.1 3.4*  CL 104 102  CO2 24 25  GLUCOSE 132* 130*  BUN 27* 28*  CREATININE 1.45* 1.37*  CALCIUM 9.1 8.8*  MG  --  2.1   Liver Function Tests: Recent Labs    01/10/22 0805  AST 24  ALT 45*  ALKPHOS 56  BILITOT 1.3*  PROT 7.5  ALBUMIN 3.7   No results for input(s): "LIPASE", "AMYLASE" in the last 72 hours. CBC: Recent Labs    01/10/22 0805 01/11/22 0528  WBC 15.4* 11.4*  NEUTROABS 11.5*  --   HGB 14.9 14.3  HCT 42.9 41.0  MCV  87.0 86.0  PLT 397 310   Cardiac Enzymes: No results for input(s): "CKTOTAL", "CKMB", "CKMBINDEX", "TROPONINI" in the last 72 hours. BNP: Invalid input(s): "POCBNP" D-Dimer: Recent Labs    01/10/22 1655  DDIMER 1.41*   Hemoglobin A1C: No results for input(s): "HGBA1C" in the last 72 hours. Fasting Lipid Panel: No results for input(s): "CHOL", "HDL", "LDLCALC", "TRIG", "CHOLHDL", "LDLDIRECT" in the last 72 hours. Thyroid Function Tests: Recent Labs    01/10/22 1029  TSH 12.758*   Anemia Panel: No results for input(s): "VITAMINB12", "FOLATE", "FERRITIN", "TIBC", "IRON", "RETICCTPCT" in the last 72 hours.   PHYSICAL EXAM General: Well developed, well nourished, in no acute distress HEENT:  Normocephalic and atramatic Neck:  No JVD.  Lungs: Clear bilaterally to auscultation and percussion. Heart: HRRR . Normal S1 and S2 without gallops or murmurs.  Abdomen: Bowel sounds are positive, abdomen soft and non-tender  Msk:  Back normal, normal gait. Normal strength and tone for age. Extremities: No clubbing, cyanosis or edema.   Neuro: Alert and oriented X 3. Psych:  Good affect, responds appropriately  TELEMETRY: atrial fibrillation, HR 92-117   ASSESSMENT AND PLAN: Patient sitting in bedside chair, denies chest pain, shortness of breath. Continues to be atrial fibrillation. Will continue to follow.  Principal Problem:   Atrial fibrillation with RVR (HCC) Active Problems:   CAD S/P percutaneous coronary angioplasty   CHF (congestive heart failure) ([place])   Acute respiratory failure with hypoxia ([place])    Joselle Deeds, FNP-C 01/11/2022 11:06 AM

## 2022-01-11 NOTE — ED Notes (Signed)
Pt ambulatory to restroom

## 2022-01-12 DIAGNOSIS — I4891 Unspecified atrial fibrillation: Secondary | ICD-10-CM | POA: Diagnosis not present

## 2022-01-12 LAB — CBC
HCT: 41.8 % (ref 39.0–52.0)
Hemoglobin: 15.1 g/dL (ref 13.0–17.0)
MCH: 30.1 pg (ref 26.0–34.0)
MCHC: 36.1 g/dL — ABNORMAL HIGH (ref 30.0–36.0)
MCV: 83.4 fL (ref 80.0–100.0)
Platelets: 293 10*3/uL (ref 150–400)
RBC: 5.01 MIL/uL (ref 4.22–5.81)
RDW: 12.9 % (ref 11.5–15.5)
WBC: 11.7 10*3/uL — ABNORMAL HIGH (ref 4.0–10.5)
nRBC: 0 % (ref 0.0–0.2)

## 2022-01-12 LAB — BASIC METABOLIC PANEL
Anion gap: 10 (ref 5–15)
BUN: 30 mg/dL — ABNORMAL HIGH (ref 6–20)
CO2: 27 mmol/L (ref 22–32)
Calcium: 8.8 mg/dL — ABNORMAL LOW (ref 8.9–10.3)
Chloride: 97 mmol/L — ABNORMAL LOW (ref 98–111)
Creatinine, Ser: 1.32 mg/dL — ABNORMAL HIGH (ref 0.61–1.24)
GFR, Estimated: >60 mL/min (ref >60)
Glucose, Bld: 99 mg/dL (ref 70–99)
Potassium: 3.1 mmol/L — ABNORMAL LOW (ref 3.5–5.1)
Sodium: 134 mmol/L — ABNORMAL LOW (ref 135–145)

## 2022-01-12 LAB — MAGNESIUM: Magnesium: 2.1 mg/dL (ref 1.7–2.4)

## 2022-01-12 MED ORDER — POTASSIUM CHLORIDE CRYS ER 20 MEQ PO TBCR
40.0000 meq | EXTENDED_RELEASE_TABLET | ORAL | Status: AC
  Administered 2022-01-12 (×2): 40 meq via ORAL
  Filled 2022-01-12 (×2): qty 2

## 2022-01-12 NOTE — Progress Notes (Signed)
[facility]  - [place] [facility]   PATIENT NAME: Andres [name]    MR#:  [id number]  DATE OF BIRTH:  1965-10-29  SUBJECTIVE:   Sitting out in the chair. Came in with a fib with RVR . Currently heart rate in the 100's. Sats on room air stable. Diuresed well with IV Lasix.   VITALS:  Blood pressure 129/76, pulse (!) 44, temperature 97.9 F (36.6 C), temperature source Oral, resp. rate 18, height 6\' 4"  (1.93 m), weight 119.5 kg, SpO2 98 %.  PHYSICAL EXAMINATION:   GENERAL:  56 y.o.-year-old patient lying in the bed with no acute distress.  LUNGS: Normal breath sounds bilaterally, no wheezing CARDIOVASCULAR: S1, S2 normal. No murmurs, irregularly irregular   ABDOMEN: Soft, nontender, nondistended. Bowel sounds present.  EXTREMITIES: No  edema b/l.    NEUROLOGIC: nonfocal  patient is alert and awake SKIN: No obvious rash, lesion, or ulcer.   LABORATORY PANEL:  CBC Recent Labs  Lab 01/12/22 0507  WBC 11.7*  HGB 15.1  HCT 41.8  PLT 293    Chemistries  Recent Labs  Lab 01/10/22 0805 01/11/22 0528 01/12/22 0507  NA 138   < > 134*  K 4.1   < > 3.1*  CL 104   < > 97*  CO2 24   < > 27  GLUCOSE 132*   < > 99  BUN 27*   < > 30*  CREATININE 1.45*   < > 1.32*  CALCIUM 9.1   < > 8.8*  MG  --    < > 2.1  AST 24  --   --   ALT 45*  --   --   ALKPHOS 56  --   --   BILITOT 1.3*  --   --    < > = values in this interval not displayed.   Assessment and Plan  CASHIS RILL is a 56 y.o. male with medical history significant of atrial fibrillation on Eliquis, CAD status post PCI, cirrhosis, nicotine dependence presented to the hospital with complaints of shortness of breath and atrial fibrillation with RVR.    Patient was recently hospitalized couple of times with atrial fibrillation with RVR and CHF exacerbation.  He went and underwent cardiac catheterization on 10/16 showing nonobstructive CAD and patent previous stents.  Echocardiogram on 10/17 was overall  unremarkable.  His metoprolol was increased, started on amiodarone and Eliquis and was discharged home.     Atrial fibrillation with RVR - was started on dilt gtt, which was weaned off the same day.   --cardiology consulted--[facility] --TSH elevated, but free T4 wnl. --cont amio and Lopressor --cont Eliquis --consider cardioversion --will keep NPO after midnight to see if [facility] cardiology can do it tomorrow   Acute congestive heart failure  --has normal systolic and diastolic function from last Echo on 01/07/22.  Heart failure 2/2 Afib w RVR.  CXR showed findings suggesting pulm edema. --reportedly did not respond well to IV lasix 40 mg, so IV lasix increased to 80 mg with added metolazone today with good urine output. --metolazone and IV lasix 80 mg daily - UOP great --sats stable on RA   CAD status post PCI - Continue aspirin, statin, beta-blocker.   Leukocytosis - Chronic.  No obvious evidence of infection     DVT prophylaxis: 01/09/22 Code Status: Full code  Family Communication:  Level of care: Progressive Dispo:   The patient is from: home Anticipated d/c is to: home Anticipated d/c  date is: 1-2 days Patient currently is not medically ready to d/c due to: IV lasix, consider cardioversion         TOTAL TIME TAKING CARE OF THIS PATIENT: 35 minutes.  >50% time spent on counselling and coordination of care  Note: This dictation was prepared with Dragon dictation along with smaller phrase technology. Any transcriptional errors that result from this process are unintentional.  Fritzi Mandes M.D    [facility]   CC: Primary care physician; [facility]

## 2022-01-12 NOTE — Progress Notes (Signed)
SUBJECTIVE: Andres [name] is a 56 y.o. male with past medical history significant for atrial fibrillation on Eliquis, CAD status post PCI, GERD, cirrhosis, nicotine dependence, insomnia who presented to the hospital on 01/10/22 with complaints of shortness of breath and atrial fibrillation with RVR.  Patient was recently hospitalized with atrial fibrillation with RVR and CHF exacerbation.  He underwent cardiac catheterization on 01/06/22 showing nonobstructive CAD and patent previous stents.  Echocardiogram on 10/17 was overall unremarkable.  His metoprolol was increased, started on amiodarone and Eliquis and was discharged home.  He came back again 3 days ago with similar symptoms but ended up leaving AMA.   He returned back Friday morning again with complaints of shortness of breath and found to be in atrial fibrillation with RVR with elevated BNP and CHF exacerbation.     Vitals:   01/11/22 2027 01/11/22 2126 01/12/22 0754 01/12/22 1117  BP: 137/81 (!) 147/99 (!) 122/97 129/76  Pulse: (!) 111 65 (!) 59 (!) 44  Resp: 16 20 18 18   Temp:  (!) 97.5 F (36.4 C) 97.9 F (36.6 C)   TempSrc:  Oral Oral   SpO2: 92% 97% 99% 98%  Weight:      Height:        Intake/Output Summary (Last 24 hours) at 01/12/2022 1419 Last data filed at 01/12/2022 0900 Gross per 24 hour  Intake --  Output 4000 ml  Net -4000 ml    LABS: Basic Metabolic Panel: Recent Labs    01/11/22 0528 01/12/22 0507  NA 136 134*  K 3.4* 3.1*  CL 102 97*  CO2 25 27  GLUCOSE 130* 99  BUN 28* 30*  CREATININE 1.37* 1.32*  CALCIUM 8.8* 8.8*  MG 2.1 2.1   Liver Function Tests: Recent Labs    01/10/22 0805  AST 24  ALT 45*  ALKPHOS 56  BILITOT 1.3*  PROT 7.5  ALBUMIN 3.7   No results for input(s): "LIPASE", "AMYLASE" in the last 72 hours. CBC: Recent Labs    01/10/22 0805 01/11/22 0528 01/12/22 0507  WBC 15.4* 11.4* 11.7*  NEUTROABS 11.5*  --   --   HGB 14.9 14.3 15.1  HCT 42.9 41.0 41.8  MCV 87.0 86.0  83.4  PLT 397 310 293   Cardiac Enzymes: No results for input(s): "CKTOTAL", "CKMB", "CKMBINDEX", "TROPONINI" in the last 72 hours. BNP: Invalid input(s): "POCBNP" D-Dimer: Recent Labs    01/10/22 1655  DDIMER 1.41*   Hemoglobin A1C: No results for input(s): "HGBA1C" in the last 72 hours. Fasting Lipid Panel: No results for input(s): "CHOL", "HDL", "LDLCALC", "TRIG", "CHOLHDL", "LDLDIRECT" in the last 72 hours. Thyroid Function Tests: Recent Labs    01/10/22 1029  TSH 12.758*   Anemia Panel: No results for input(s): "VITAMINB12", "FOLATE", "FERRITIN", "TIBC", "IRON", "RETICCTPCT" in the last 72 hours.   PHYSICAL EXAM General: Well developed, well nourished, in no acute distress HEENT:  Normocephalic and atramatic Neck:  No JVD.  Lungs: Clear bilaterally to auscultation and percussion. Heart: HRRR . Normal S1 and S2 without gallops or murmurs.  Abdomen: Bowel sounds are positive, abdomen soft and non-tender  Msk:  Back normal, normal gait. Normal strength and tone for age. Extremities: No clubbing, cyanosis or edema.   Neuro: Alert and oriented X 3. Psych:  Good affect, responds appropriately  TELEMETRY: atrial fibrillation, 90's-110 bpm  ASSESSMENT AND PLAN: Patient feeling better. Denies chest pain. Shortness of breath and lower extremity edema improved. Patient diuresing well on IV Lasix. Metolazone added  yesterday. Patient NPO tonight for possible cardioversion tomorrow.   Principal Problem:   Atrial fibrillation with RVR (HCC) Active Problems:   CAD S/P percutaneous coronary angioplasty   CHF (congestive heart failure) ([place])   Acute respiratory failure with hypoxia ([place])    Lassie Demorest, FNP-C 01/12/2022 2:19 PM

## 2022-01-13 ENCOUNTER — Inpatient Hospital Stay: Payer: BC Managed Care – PPO | Admitting: Anesthesiology

## 2022-01-13 ENCOUNTER — Other Ambulatory Visit: Payer: Self-pay

## 2022-01-13 ENCOUNTER — Inpatient Hospital Stay
Admit: 2022-01-13 | Discharge: 2022-01-13 | Disposition: A | Discharge status: Home / Self Care | Payer: BC Managed Care – PPO | Attending: Internal Medicine | Admitting: Internal Medicine

## 2022-01-13 ENCOUNTER — Encounter
Admission: EM | Disposition: A | Discharge status: Home / Self Care | Payer: Self-pay | Source: Home / Self Care | Attending: Internal Medicine

## 2022-01-13 HISTORY — PX: TEE WITHOUT CARDIOVERSION: SHX5443

## 2022-01-13 HISTORY — PX: CARDIOVERSION: SHX1299

## 2022-01-13 LAB — PROTIME-INR
INR: 1.6 — ABNORMAL HIGH (ref 0.8–1.2)
Prothrombin Time: 18.8 seconds — ABNORMAL HIGH (ref 11.4–15.2)

## 2022-01-13 LAB — MAGNESIUM: Magnesium: 2 mg/dL (ref 1.7–2.4)

## 2022-01-13 LAB — BASIC METABOLIC PANEL
Anion gap: 13 (ref 5–15)
BUN: 30 mg/dL — ABNORMAL HIGH (ref 6–20)
CO2: 28 mmol/L (ref 22–32)
Calcium: 9.6 mg/dL (ref 8.9–10.3)
Chloride: 95 mmol/L — ABNORMAL LOW (ref 98–111)
Creatinine, Ser: 1.5 mg/dL — ABNORMAL HIGH (ref 0.61–1.24)
GFR, Estimated: 54 mL/min — ABNORMAL LOW (ref >60)
Glucose, Bld: 106 mg/dL — ABNORMAL HIGH (ref 70–99)
Potassium: 2.8 mmol/L — ABNORMAL LOW (ref 3.5–5.1)
Sodium: 136 mmol/L (ref 135–145)

## 2022-01-13 LAB — POTASSIUM: Potassium: 3.2 mmol/L — ABNORMAL LOW (ref 3.5–5.1)

## 2022-01-13 LAB — PHOSPHORUS: Phosphorus: 4.8 mg/dL — ABNORMAL HIGH (ref 2.5–4.6)

## 2022-01-13 SURGERY — ECHOCARDIOGRAM, TRANSESOPHAGEAL
Anesthesia: General

## 2022-01-13 MED ORDER — ENSURE ENLIVE PO LIQD: 237.0000 mL | Freq: Two times a day (BID) | ORAL | Status: DC

## 2022-01-13 MED ORDER — BUTAMBEN-TETRACAINE-BENZOCAINE 2-2-14 % EX AERO
INHALATION_SPRAY | CUTANEOUS | Status: AC
  Administered 2022-01-13: 3
  Filled 2022-01-13: qty 5

## 2022-01-13 MED ORDER — LIDOCAINE VISCOUS HCL 2 % MT SOLN
OROMUCOSAL | Status: AC
  Administered 2022-01-13: 15 mL
  Filled 2022-01-13: qty 15

## 2022-01-13 MED ORDER — PROPOFOL 10 MG/ML IV BOLUS
INTRAVENOUS | Status: AC
  Filled 2022-01-13: qty 40

## 2022-01-13 MED ORDER — METOPROLOL TARTRATE 25 MG PO TABS: 25.0000 mg | ORAL_TABLET | Freq: Two times a day (BID) | ORAL | Status: DC

## 2022-01-13 MED ORDER — POTASSIUM CHLORIDE CRYS ER 20 MEQ PO TBCR: 40.0000 meq | EXTENDED_RELEASE_TABLET | Freq: Every day | ORAL | 0 refills | Status: DC

## 2022-01-13 MED ORDER — FUROSEMIDE 20 MG PO TABS: 20.0000 mg | ORAL_TABLET | Freq: Every day | ORAL | Status: DC

## 2022-01-13 MED ORDER — SODIUM CHLORIDE 0.9 % IV SOLN: INTRAVENOUS | Status: DC

## 2022-01-13 MED ORDER — PROPOFOL 10 MG/ML IV BOLUS
INTRAVENOUS | Status: DC | PRN
  Administered 2022-01-13: 40 mg via INTRAVENOUS
  Administered 2022-01-13: 20 mg via INTRAVENOUS
  Administered 2022-01-13: 50 mg via INTRAVENOUS
  Administered 2022-01-13: 20 mg via INTRAVENOUS

## 2022-01-13 MED ORDER — ADULT MULTIVITAMIN W/MINERALS CH: 1.0000 | ORAL_TABLET | Freq: Every day | ORAL | Status: DC

## 2022-01-13 MED ORDER — PHENYLEPHRINE 80 MCG/ML (10ML) SYRINGE FOR IV PUSH (FOR BLOOD PRESSURE SUPPORT)
PREFILLED_SYRINGE | INTRAVENOUS | Status: AC
  Filled 2022-01-13: qty 20

## 2022-01-13 MED ORDER — POTASSIUM CHLORIDE CRYS ER 20 MEQ PO TBCR
40.0000 meq | EXTENDED_RELEASE_TABLET | ORAL | Status: DC
  Administered 2022-01-13 (×2): 40 meq via ORAL
  Filled 2022-01-13 (×2): qty 2

## 2022-01-13 MED ORDER — POTASSIUM CHLORIDE CRYS ER 20 MEQ PO TBCR: 20.0000 meq | EXTENDED_RELEASE_TABLET | Freq: Every day | ORAL | 0 refills | Status: DC

## 2022-01-13 NOTE — Anesthesia Preprocedure Evaluation (Signed)
Anesthesia Evaluation  Patient identified by MRN, date of birth, ID band Patient awake    Reviewed: Allergy & Precautions, NPO status , Patient's Chart, lab work & pertinent test results  Airway Mallampati: III  TM Distance: >3 FB Neck ROM: full    Dental  (+) Upper Dentures, Lower Dentures   Pulmonary neg pulmonary ROS, COPD, Current Smoker and Patient abstained from smoking.   Pulmonary exam normal  + decreased breath sounds      Cardiovascular Exercise Tolerance: Good + CAD, + Past MI, + Cardiac Stents and +CHF  negative cardio ROS Normal cardiovascular exam+ dysrhythmias Atrial Fibrillation  Rhythm:Irregular Rate:Tachycardia     Neuro/Psych negative neurological ROS  negative psych ROS   GI/Hepatic negative GI ROS, Neg liver ROS,,,  Endo/Other  negative endocrine ROS    Renal/GU negative Renal ROS  negative genitourinary   Musculoskeletal   Abdominal  (+) + obese  Peds negative pediatric ROS (+)  Hematology negative hematology ROS (+)   Anesthesia Other Findings Past Medical History: No date: A-fib (HCC) No date: CHF (congestive heart failure) (HCC) No date: Coronary artery disease No date: History of kidney stones  Past Surgical History: 01/06/2022: LEFT HEART CATH AND CORONARY ANGIOGRAPHY; N/A     Comment:  Procedure: LEFT HEART CATH AND CORONARY ANGIOGRAPHY               possible PCI and stent;  Surgeon: Isaias Cowman,               MD;  Location: [facility];  Service:               Cardiovascular;  Laterality: N/A;  BMI    Body Mass Index: 29.06 kg/m      Reproductive/Obstetrics negative OB ROS                             Anesthesia Physical Anesthesia Plan  ASA: 3  Anesthesia Plan: General   Post-op Pain Management:    Induction: Intravenous  PONV Risk Score and Plan: Propofol infusion and TIVA  Airway Management Planned: Natural Airway and  Nasal Cannula  Additional Equipment:   Intra-op Plan:   Post-operative Plan:   Informed Consent: I have reviewed the patients History and Physical, chart, labs and discussed the procedure including the risks, benefits and alternatives for the proposed anesthesia with the patient or authorized representative who has indicated his/her understanding and acceptance.     Dental Advisory Given  Plan Discussed with: CRNA and Surgeon  Anesthesia Plan Comments:        Anesthesia Quick Evaluation

## 2022-01-13 NOTE — Progress Notes (Signed)
Initial Nutrition Assessment  DOCUMENTATION CODES:   Obesity unspecified  INTERVENTION:   -Once diet is advanced, add:   -Ensure Enlive po BID, each supplement provides 350 kcal and 20 grams of protein -MVI with minerals daily -RD provided "Low Sodium Nutrition Therapy" handout from AND's Nutrition Care Manual; attached to AVS/ discharge summary  NUTRITION DIAGNOSIS:   Increased nutrient needs related to chronic illness (CHF) as evidenced by estimated needs.  GOAL:   Patient will meet greater than or equal to 90% of their needs  MONITOR:   PO intake, Supplement acceptance, Diet advancement  REASON FOR ASSESSMENT:   Rounds    ASSESSMENT:   Pt with medical history significant of atrial fibrillation on Eliquis, CAD status post PCI, GERD, cirrhosis, nicotine dependence, insomnia admitted with complaints of shortness of breath and atrial fibrillation with RVR.  Pt admitted with a-fib with RVR.   Reviewed I/O's: -1.5 L x 24 hours and -6 L since admission  UOP:1  L x 24 hours  Per cardiology notes, plan for TEE and cardioversion today.   Pt unavailable at time of visit. Pt out of room at time of visit. RD unable to obtain further nutrition-related history or complete nutrition-focused physical exam at this time.    Reviewed wt hx; pt has experienced a 3.6% wt loss over the past 3 weeks, which is not significant for time frame. Pt also with mild edema, which may be masking true weight loss as well as fat and muscle depletions.   Medications reviewed and include vitamin B-12, lasix, and potassium chloride.   Labs reviewed: K: 2.8 (on PO supplementation).  Diet Order:   Diet Order     None       EDUCATION NEEDS:   No education needs have been identified at this time  Skin:  Skin Assessment: Reviewed RN Assessment  Last BM:  Unknown  Height:   Ht Readings from Last 1 Encounters:  01/13/22 6\' 1"  (1.854 m)    Weight:   Wt Readings from Last 1 Encounters:   01/13/22 110.2 kg    Ideal Body Weight:  83.6 kg  BMI:  Body mass index is 32.06 kg/m.  Estimated Nutritional Needs:   Kcal:  2300-2500  Protein:  125-150 grams  Fluid:  > 2 L    Loistine Chance, RD, LDN, [place] Registered Dietitian II Certified Diabetes Care and Education Specialist Please refer to [facility] for RD and/or RD on-call/weekend/after hours pager

## 2022-01-13 NOTE — Anesthesia Postprocedure Evaluation (Signed)
Anesthesia Post Note  Patient: Andres [name]  Procedure(s) Performed: TRANSESOPHAGEAL ECHOCARDIOGRAM (TEE) CARDIOVERSION  Anesthesia Type: General Level of consciousness: awake and oriented Pain management: satisfactory to patient Vital Signs Assessment: post-procedure vital signs reviewed and stable Respiratory status: spontaneous breathing and respiratory function stable Cardiovascular status: stable Anesthetic complications: no   No notable events documented.   Last Vitals:  Vitals:   01/13/22 1245 01/13/22 1300  BP: 111/86 114/85  Pulse: (!) 56 (!) 57  Resp: 16 18  Temp:    SpO2: 96% 93%    Last Pain:  Vitals:   01/13/22 1300  TempSrc:   PainSc: 0-No pain                 VAN STAVEREN,Alannie Amodio

## 2022-01-13 NOTE — Consult Note (Signed)
Pharmacy Electrolyte Replacement  Recent Labs:  Recent Labs    01/13/22 0647  K 2.8*  MG 2.0  PHOS 4.8*  CREATININE 1.50*    Low Critical Values (K </= 2.5, Phos </= 1, Mg </= 1) Present: None  MD Contacted: Fritzi Mandes MD  Assessment:  30 yom presents to ED with Afib with RVR. Pt has been hospitalized recently a couple times with Afib with RVR as well. Pt potassium is 2.8 today, a decrease from 3.1 yesterday due to the pt receiving a dose increase to their Lasix (40mg  IV >> 80 mg IV). The pt received 34mEq of potassium over the past 2 days, but it remains low. The Lasix is now being decreased to 20mg  PO due to the Scr bump and low potassium.   Plan:  Initiate KCL 40 mEq tab q4hr x3 doses (increase due to 2 doses not being enough) Monitor BMP and CBC daily  Tanja Port, Student-PharmD 01/13/2022 at [phone]

## 2022-01-13 NOTE — Discharge Summary (Signed)
Physician Discharge Summary   Patient: Andres [name] MRN: [id number] DOB: 02-Nov-1965  Admit date:     01/10/2022  Discharge date: 01/13/22  Discharge Physician: Enedina FinnerSona Lowana Hable   [place] [facility]   Recommendations at discharge:   follow-up  primary care in one week follow-up Dr. Juliann [place] cardiology in 1 to 2 weeks.  Discharge Diagnoses: Principal Problem:   Atrial fibrillation with RVR (HCC) Active Problems:   CHF (congestive heart failure) (HCC)   CAD S/P percutaneous coronary angioplasty   Acute respiratory failure with hypoxia [facility](HCC)   Hospital Course: Andres [name] is a 56 y.o. male with medical history significant of atrial fibrillation on Eliquis, CAD status post PCI, cirrhosis, nicotine dependence presented to the hospital with complaints of shortness of breath and atrial fibrillation with RVR.    Patient was recently hospitalized couple of times with atrial fibrillation with RVR and CHF exacerbation.  He went and underwent cardiac catheterization on 10/16 showing nonobstructive CAD and patent previous stents.  Echocardiogram on 10/17 was overall unremarkable.  His metoprolol was increased, started on amiodarone and Eliquis and was discharged home.     Atrial fibrillation with RVR - was started on dilt gtt, which was weaned off the same day.   --cardiology consulted--[facility] --TSH elevated, but free T4 wnl. --cont amio and Lopressor --cont Eliquis -- patient seen by Dr. Gwen PoundsKowalski. Patient underwent cardioversion. Remains in sinus rhythm. Resumed rate blocking agents.  --Okay from cardiology standpoint for discharge.   Acute congestive heart failure  hypokalemia due to diuresis --has normal systolic and diastolic function from last Echo on 01/07/22.  Heart failure 2/2 Afib w RVR.  CXR showed findings suggesting pulm edema. --reportedly did not respond well to IV lasix 40 mg, so IV lasix increased to 80 mg with added metolazone today with good urine  output. --metolazone and IV lasix 80 mg daily - UOP great --sats stable on RA -- replacing potassium. Patient will discharged on PO potassium to be taken with Lasix. -- Patient currently on room air. --Change to PO Lasix 20 mg daily  CAD status post PCI - Continue aspirin, statin, beta-blocker.   Leukocytosis - Chronic.  No obvious evidence of infection     DVT prophylaxis: NW:GNFAOZHOn:Eliquis Code Status: Full code  Family Communication:  Level of care: Progressive Dispo:   The patient is from: home Anticipated d/c is to: home Anticipated d/c date is: today      [facility] Procedures performed: CVV  Disposition: Home Diet recommendation:  Cardiac diet DISCHARGE MEDICATION: Allergies as of 01/13/2022       Reactions   Statins Other (See Comments)   Myalgias, cannot remember which statin he took in the past but willing to try atorvastatin 12/2021        Medication List     TAKE these medications    amiodarone 200 MG tablet Commonly known as: Pacerone Take 1 tablet (200 mg total) by mouth 2 (two) times daily. Until Friday, then start taking once daily   apixaban 5 MG Tabs tablet Commonly known as: ELIQUIS Take 1 tablet (5 mg total) by mouth 2 (two) times daily.   aspirin EC 81 MG tablet Take 1 tablet (81 mg total) by mouth daily. Swallow whole.   atorvastatin 40 MG tablet Commonly known as: LIPITOR Take 1 tablet (40 mg total) by mouth every evening.   Cyanocobalamin 1000 MCG Tbcr Take 1 tablet by mouth daily.   furosemide 20 MG tablet Commonly known as:  LASIX Take 1 tablet (20 mg total) by mouth daily.   Metoprolol Tartrate 75 MG Tabs Take 75 mg by mouth 2 (two) times daily.   nitroGLYCERIN 0.4 MG SL tablet Commonly known as: NITROSTAT 1 TABLET UNDER TONGUE AT ONSET OF CHEST PAIN. REPEAT IN 5 MIN IF NOT RESOLVED, MAX 3 DOSES. 911 IF NEEDED.   potassium chloride SA 20 MEQ tablet Commonly known as: KLOR-CON M Take 1 tablet (20 mEq  total) by mouth daily.        Follow-up Information     [place], [facility]. Schedule an appointment as soon as possible for a visit in 1 week(s).   Contact information: [address] [place] [address] [phone]         Dorothyann Peng D, MD. Schedule an appointment as soon as possible for a visit in 1 week(s).   Specialties: Cardiology, Internal Medicine Why: chf afib with rvr and s/p cardioversion Contact information: [address] [place] [address] [phone]                Discharge Exam: Ceasar Mons Weights   01/10/22 6606 01/13/22 0606 01/13/22 1124  Weight: 119.5 kg 108.3 kg 110.2 kg     Condition at discharge: fair  The results of significant diagnostics from this hospitalization (including imaging, microbiology, ancillary and laboratory) are listed below for reference.   Imaging Studies: ECHO TEE  Result Date: 01/13/2022    TRANSESOPHOGEAL ECHO REPORT   Patient Name:   Demichael A [name] Date of Exam: 01/13/2022 Medical Rec #:  [id number]       Height:       73.0 in Accession #:    [id number]      Weight:       243.0 lb Date of Birth:  11-29-1965       BSA:          2.337 m Patient Age:    56 years        BP:           Not listed in chart/Not listed in                                               chart mmHg Patient Gender: M               HR:           53 bpm. Exam Location:  [facility] Procedure: Transesophageal Echo, Cardiac Doppler and Color Doppler Indications:     Not listed on TEE check-in sheet  History:         Patient has prior history of Echocardiogram examinations, most                  recent 01/07/2022. CHF, CAD; Arrythmias:Atrial Fibrillation.  Sonographer:     Cristela Blue Referring Phys:  [id number] Lamar Blinks Diagnosing Phys: Arnoldo Hooker MD PROCEDURE: The transesophogeal probe was passed without difficulty through the esophogus of the patient. Sedation performed by performing physician. The patient developed no complications  during the procedure. A successful direct current cardioversion was performed at 120 joules with 1 attempt. IMPRESSIONS  1. Left ventricular ejection fraction, by estimation, is 55 to 60%. The left ventricle has normal function.  2. Right ventricular systolic function is normal. The right ventricular size is normal.  3. Left atrial size  was moderately dilated. No left atrial/left atrial appendage thrombus was detected.  4. The mitral valve is normal in structure. Moderate mitral valve regurgitation.  5. The aortic valve is tricuspid. Aortic valve regurgitation is trivial.  6. There is Moderate (Grade III) plaque.  7. Cannot exclude a small PFO. Agitated saline contrast bubble study was positive with shunting observed within 3-6 cardiac cycles suggestive of interatrial shunt. FINDINGS  Left Ventricle: Left ventricular ejection fraction, by estimation, is 55 to 60%. The left ventricle has normal function. The left ventricular internal cavity size was normal in size. Right Ventricle: The right ventricular size is normal. No increase in right ventricular wall thickness. Right ventricular systolic function is normal. Left Atrium: Left atrial size was moderately dilated. Spontaneous echo contrast was present. No left atrial/left atrial appendage thrombus was detected. Right Atrium: Right atrial size was normal in size. Pericardium: There is no evidence of pericardial effusion. Mitral Valve: The mitral valve is normal in structure. Moderate mitral valve regurgitation. Tricuspid Valve: The tricuspid valve is normal in structure. Tricuspid valve regurgitation is trivial. Aortic Valve: The aortic valve is tricuspid. Aortic valve regurgitation is trivial. Pulmonic Valve: The pulmonic valve was normal in structure. Pulmonic valve regurgitation is trivial. Aorta: The aortic root and ascending aorta are structurally normal, with no evidence of dilitation. There is moderate (Grade III) plaque. IAS/Shunts: Cannot exclude a small  PFO. Agitated saline contrast was given intravenously to evaluate for intracardiac shunting. Agitated saline contrast bubble study was positive with shunting observed within 3-6 cardiac cycles suggestive of interatrial shunt. Serafina Royals MD Electronically signed by Serafina Royals MD Signature Date/Time: 01/13/2022/12:43:51 PM    Final    DG Chest Port 1 View  Result Date: 01/10/2022 CLINICAL DATA:  Shortness breath EXAM: PORTABLE CHEST 1 VIEW COMPARISON:  Chest radiograph 01/09/2022 FINDINGS: No pleural effusion. No pneumothorax. Unchanged cardiac and mediastinal contours. Redemonstrated diffuse interstitial opacities, slightly increased in the bilateral upper lobes compared to prior exam. No displaced rib fractures. Visualized upper abdomen is unremarkable. IMPRESSION: Redemonstrated diffuse interstitial opacities, slightly increased in the bilateral upper lobes compared to prior exam. Findings are favored to represent pulmonary edema, but atypical infection is also a differential consideration Electronically Signed   By: Marin Roberts M.D.   On: 01/10/2022 08:30   DG Chest 2 View  Result Date: 01/10/2022 CLINICAL DATA:  Chest pain EXAM: CHEST - 2 VIEW COMPARISON:  01/05/2022 FINDINGS: Heart is normal size. Peribronchial thickening. Diffuse interstitial prominence throughout the lungs. No visible significant effusions. No acute bony abnormality. IMPRESSION: Peribronchial thickening with diffuse interstitial prominence could reflect interstitial edema or atypical infection. Electronically Signed   By: Rolm Baptise M.D.   On: 01/10/2022 00:09   ECHOCARDIOGRAM COMPLETE  Result Date: 01/07/2022    ECHOCARDIOGRAM REPORT   Patient Name:   Leldon A Morash Date of Exam: 01/07/2022 Medical Rec #:  [id number]       Height:       72.0 in Accession #:    [id number]      Weight:       252.0 lb Date of Birth:  [birth year]       BSA:          2.350 m Patient Age:    28 years        BP:           124/87 mmHg  Patient Gender: M               HR:  71 bpm. Exam Location:  [facility] Procedure: 2D Echo, Color Doppler, Cardiac Doppler and Intracardiac            Opacification Agent Indications:     I50.31 congestive heart failure-Acute Diastolic  History:         Patient has no prior history of Echocardiogram examinations.                  CAD; Arrythmias:Atrial Fibrillation.  Sonographer:     Humphrey Rolls Referring Phys:  [id number] INOMVEHM AGBATA Diagnosing Phys: Arnoldo Hooker MD  Sonographer Comments: Suboptimal apical window. IMPRESSIONS  1. Left ventricular ejection fraction, by estimation, is 65 to 70%. The left ventricle has normal function. The left ventricle has no regional wall motion abnormalities. Left ventricular diastolic parameters were normal.  2. Right ventricular systolic function is normal. The right ventricular size is normal.  3. Left atrial size was mildly dilated.  4. The mitral valve is normal in structure. Moderate mitral valve regurgitation.  5. The aortic valve is normal in structure. Aortic valve regurgitation is trivial. FINDINGS  Left Ventricle: Left ventricular ejection fraction, by estimation, is 65 to 70%. The left ventricle has normal function. The left ventricle has no regional wall motion abnormalities. Definity contrast agent was given IV to delineate the left ventricular  endocardial borders. The left ventricular internal cavity size was normal in size. There is no left ventricular hypertrophy. Left ventricular diastolic parameters were normal. Right Ventricle: The right ventricular size is normal. No increase in right ventricular wall thickness. Right ventricular systolic function is normal. Left Atrium: Left atrial size was mildly dilated. Right Atrium: Right atrial size was normal in size. Pericardium: There is no evidence of pericardial effusion. Mitral Valve: The mitral valve is normal in structure. Moderate mitral valve regurgitation. Tricuspid Valve: The tricuspid valve is normal in  structure. Tricuspid valve regurgitation is mild. Aortic Valve: The aortic valve is normal in structure. Aortic valve regurgitation is trivial. Aortic valve mean gradient measures 3.0 mmHg. Aortic valve peak gradient measures 5.9 mmHg. Aortic valve area, by VTI measures 2.55 cm. Pulmonic Valve: The pulmonic valve was normal in structure. Pulmonic valve regurgitation is trivial. Aorta: The aortic root and ascending aorta are structurally normal, with no evidence of dilitation. IAS/Shunts: No atrial level shunt detected by color flow Doppler.  LEFT VENTRICLE PLAX 2D LVIDd:         5.10 cm LVIDs:         2.70 cm LV PW:         1.10 cm LV IVS:        1.20 cm LVOT diam:     2.30 cm LV SV:         52 LV SV Index:   22 LVOT Area:     4.15 cm  RIGHT VENTRICLE RV Basal diam:  4.00 cm LEFT ATRIUM             Index        RIGHT ATRIUM           Index LA diam:        5.10 cm 2.17 cm/m   RA Area:     19.90 cm LA Vol (A2C):   49.8 ml 21.19 ml/m  RA Volume:   53.90 ml  22.93 ml/m LA Vol (A4C):   58.7 ml 24.97 ml/m LA Biplane Vol: 54.7 ml 23.27 ml/m  AORTIC VALVE  PULMONIC VALVE AV Area (Vmax):    2.75 cm     PV Vmax:          0.83 m/s AV Area (Vmean):   2.55 cm     PV Vmean:         57.700 cm/s AV Area (VTI):     2.55 cm     PV VTI:           0.138 m AV Vmax:           121.00 cm/s  PV Peak grad:     2.8 mmHg AV Vmean:          84.100 cm/s  PV Mean grad:     2.0 mmHg AV VTI:            0.202 m      PR End Diast Vel: 13.25 msec AV Peak Grad:      5.9 mmHg AV Mean Grad:      3.0 mmHg LVOT Vmax:         80.00 cm/s LVOT Vmean:        51.700 cm/s LVOT VTI:          0.124 m LVOT/AV VTI ratio: 0.61  AORTA Ao Root diam: 3.10 cm MITRAL VALVE MV Area (PHT): 2.86 cm     SHUNTS MV Decel Time: 265 msec     Systemic VTI:  0.12 m MV E velocity: 176.00 cm/s  Systemic Diam: 2.30 cm Arnoldo Hooker MD Electronically signed by Arnoldo Hooker MD Signature Date/Time: 01/07/2022/12:21:08 PM    Final    CARDIAC  CATHETERIZATION  Result Date: 01/06/2022   Prox RCA lesion is 20% stenosed.   Mid RCA lesion is 20% stenosed.   Dist RCA lesion is 40% stenosed.   RPAV lesion is 40% stenosed.   Prox Cx lesion is 50% stenosed.   2nd Mrg lesion is 50% stenosed.   1st Mrg lesion is 30% stenosed.   Prox LAD lesion is 50% stenosed.   1st Diag lesion is 60% stenosed.   Previously placed Prox LAD to Mid LAD stent of unknown type is  widely patent.   The left ventricular systolic function is normal.   The left ventricular ejection fraction is 50-55% by visual estimate. 1.  Diffuse moderate nonobstructive coronary artery disease with patent stent proximal/mid LAD, 50% stenosis proximal LAD, 50% stenosis proximal left circumflex, 40% distal RCA 2.  Normal left ventricular function with estimated LVEF 50-55% Recommendations 1.  Medical therapy 2.  Resume Eliquis 01/07/2022   CT Angio Chest PE W/Cm &/Or Wo Cm  Result Date: 01/05/2022 CLINICAL DATA:  56 year old male with suspected pulmonary embolism. EXAM: CT ANGIOGRAPHY CHEST WITH CONTRAST TECHNIQUE: Multidetector CT imaging of the chest was performed using the standard protocol during bolus administration of intravenous contrast. Multiplanar CT image reconstructions and MIPs were obtained to evaluate the vascular anatomy. RADIATION DOSE REDUCTION: This exam was performed according to the departmental dose-optimization program which includes automated exposure control, adjustment of the mA and/or kV according to patient size and/or use of iterative reconstruction technique. CONTRAST:  75mL OMNIPAQUE IOHEXOL 350 MG/ML SOLN COMPARISON:  No priors. FINDINGS: Cardiovascular: There are no filling defects within the pulmonary arterial tree to suggest pulmonary embolism. Heart size is mildly enlarged. There is no significant pericardial fluid, thickening or pericardial calcification. There is aortic atherosclerosis, as well as atherosclerosis of the great vessels of the mediastinum and the  coronary arteries, including calcified atherosclerotic plaque in the left main,  left anterior descending, left circumflex and right coronary arteries. Mediastinum/Nodes: Multiple prominent borderline enlarged and mildly enlarged mediastinal and hilar lymph nodes are noted, with the largest mediastinal lymph node measuring up to 2 cm in short axis in the subcarinal nodal station and 1.5 cm in the prevascular nodal station. Esophagus is unremarkable in appearance. No axillary lymphadenopathy. Lungs/Pleura: Diffuse ground-glass attenuation and widespread interlobular septal thickening is noted throughout the lungs bilaterally, suggesting a background of interstitial pulmonary edema. No confluent consolidative airspace disease. No pleural effusions. No definite suspicious appearing pulmonary nodules or masses are noted. Mild centrilobular and paraseptal emphysema predominantly in the lung apices. Upper Abdomen: Aortic atherosclerosis. Musculoskeletal: Healing nondisplaced fracture of the lateral aspect of the left sixth rib. There are no aggressive appearing lytic or blastic lesions noted in the visualized portions of the skeleton. Review of the MIP images confirms the above findings. IMPRESSION: 1. Cardiomegaly with evidence of interstitial pulmonary edema; imaging findings suggesting underlying congestive heart failure. 2. Multiple prominent borderline and mildly enlarged mediastinal and bilateral hilar lymph nodes. This is nonspecific in the setting of congestive heart failure, but follow-up contrast enhanced chest CT is recommended in 1 month after resolution of the patient's acute illness to re-evaluate these findings and ensure regression. Lymphoproliferative disease or other malignancy is not excluded. 3. Aortic atherosclerosis, in addition to left main and three-vessel coronary artery disease. Please note that although the presence of coronary artery calcium documents the presence of coronary artery disease, the  severity of this disease and any potential stenosis cannot be assessed on this non-gated CT examination. Assessment for potential risk factor modification, dietary therapy or pharmacologic therapy may be warranted, if clinically indicated. 4. Healing nondisplaced fracture of the lateral aspect of the left sixth rib. Aortic Atherosclerosis (ICD10-I70.0). Electronically Signed   By: Trudie Reed M.D.   On: 01/05/2022 05:14   DG Chest 2 View  Result Date: 01/05/2022 CLINICAL DATA:  56 year old male with history of chest pain and shortness of breath. EXAM: CHEST - 2 VIEW COMPARISON:  Chest x-ray 01/02/2022. FINDINGS: There is cephalization of the pulmonary vasculature and slight indistinctness of the interstitial markings suggestive of mild pulmonary edema. No definite pleural effusions. No pneumothorax. Heart size is mildly enlarged. Upper mediastinal contours are within normal limits. IMPRESSION: 1. The appearance of the chest is most suggestive of congestive heart failure, as above. Electronically Signed   By: Trudie Reed M.D.   On: 01/05/2022 05:00   DG Chest Port 1 View  Result Date: 01/03/2022 CLINICAL DATA:  Chest pain EXAM: PORTABLE CHEST 1 VIEW COMPARISON:  01/24/2020 FINDINGS: Lungs are clear.  No pleural effusion or pneumothorax. The heart is normal in size. IMPRESSION: No evidence of acute cardiopulmonary disease. Electronically Signed   By: Charline Bills M.D.   On: 01/03/2022 00:02    Microbiology: Results for orders placed or performed during the hospital encounter of 01/10/22  Resp Panel by RT-PCR (Flu A&B, Covid) Anterior Nasal Swab     Status: None   Collection Time: 01/10/22 10:30 AM   Specimen: Anterior Nasal Swab  Result Value Ref Range Status   SARS Coronavirus 2 by RT PCR NEGATIVE NEGATIVE Final    Comment: (NOTE) SARS-CoV-2 target nucleic acids are NOT DETECTED.  The SARS-CoV-2 RNA is generally detectable in upper respiratory specimens during the acute phase of  infection. The lowest concentration of SARS-CoV-2 viral copies this assay can detect is 138 copies/mL. A negative result does not preclude SARS-Cov-2 infection and should not  be used as the sole basis for treatment or other patient management decisions. A negative result may occur with  improper specimen collection/handling, submission of specimen other than nasopharyngeal swab, presence of viral mutation(s) within the areas targeted by this assay, and inadequate number of viral copies(<138 copies/mL). A negative result must be combined with clinical observations, patient history, and epidemiological information. The expected result is Negative.  Fact Sheet for Patients:  BloggerCourse.com  Fact Sheet for Healthcare Providers:  SeriousBroker.it  This test is no t yet approved or cleared by the [place] FDA and  has been authorized for detection and/or diagnosis of SARS-CoV-2 by FDA under an Emergency Use Authorization (EUA). This EUA will remain  in effect (meaning this test can be used) for the duration of the COVID-19 declaration under Section 564(b)(1) of the Act, 21 U.S.C.section 360bbb-3(b)(1), unless the authorization is terminated  or revoked sooner.       Influenza A by PCR NEGATIVE NEGATIVE Final   Influenza B by PCR NEGATIVE NEGATIVE Final    Comment: (NOTE) The Xpert Xpress SARS-CoV-2/FLU/RSV plus assay is intended as an aid in the diagnosis of influenza from Nasopharyngeal swab specimens and should not be used as a sole basis for treatment. Nasal washings and aspirates are unacceptable for Xpert Xpress SARS-CoV-2/FLU/RSV testing.  Fact Sheet for Patients: BloggerCourse.com  Fact Sheet for Healthcare Providers: SeriousBroker.it  This test is not yet approved or cleared by the [place] FDA and has been authorized for detection and/or diagnosis of SARS-CoV-2  by FDA under an Emergency Use Authorization (EUA). This EUA will remain in effect (meaning this test can be used) for the duration of the COVID-19 declaration under Section 564(b)(1) of the Act, 21 U.S.C. section 360bbb-3(b)(1), unless the authorization is terminated or revoked.  Performed at [facility], [address] [place], [place] [address]     Labs: CBC: Recent Labs  Lab 01/09/22 2352 01/10/22 0805 01/11/22 0528 01/12/22 0507  WBC 13.5* 15.4* 11.4* 11.7*  NEUTROABS  --  11.5*  --   --   HGB 14.6 14.9 14.3 15.1  HCT 43.1 42.9 41.0 41.8  MCV 86.2 87.0 86.0 83.4  PLT 367 397 310 293   Basic Metabolic Panel: Recent Labs  Lab 01/08/22 0902 01/09/22 2352 01/10/22 0805 01/11/22 0528 01/12/22 0507 01/13/22 0647 01/13/22 1356  NA  --  137 138 136 134* 136  --   K 3.6 4.1 4.1 3.4* 3.1* 2.8* 3.2*  CL  --  109 104 102 97* 95*  --   CO2  --  20* --   GLUCOSE  --  116* 132* 130* 99 106*  --   BUN  --  23* 27* 28* 30* 30*  --   CREATININE  --  1.31* 1.45* 1.37* 1.32* 1.50*  --   CALCIUM  --  9.4 9.1 8.8* 8.8* 9.6  --   MG 2.1  --   --  2.1 2.1 2.0  --   PHOS  --   --   --   --   --  4.8*  --    Liver Function Tests: Recent Labs  Lab 01/10/22 0805  AST 24  ALT 45*  ALKPHOS 56  BILITOT 1.3*  PROT 7.5  ALBUMIN 3.7   CBG: No results for input(s): "GLUCAP" in the last 168 hours.  Discharge time spent: greater than 30 minutes.  Signed: Enedina Finner, MD [facility] 01/13/2022

## 2022-01-13 NOTE — CV Procedure (Signed)
Electrical Cardioversion Procedure Note Andres [name] [id number] 19-May-1965  Procedure: Electrical Cardioversion Indications:  Paroxysmal non valvular atrial fibrillation  Procedure Details Consent: Risks of procedure as well as the alternatives and risks of each were explained to the (patient/caregiver).  Consent for procedure obtained. Time Out: Verified patient identification, verified procedure, site/side was marked, verified correct patient position, special equipment/implants available, medications/allergies/relevent history reviewed, required imaging and test results available.  Performed  Patient placed on cardiac monitor, pulse oximetry, supplemental oxygen as necessary.  Sedation given: Propofol and versed as per anesthesia  Pacer pads placed anterior and posterior chest.  Cardioverted 1 time(s).  Cardioverted at 120J.  Evaluation Findings: Post procedure EKG shows: NSR Complications: None Patient did tolerate procedure well.   Serafina Royals M.D. [facility] 01/13/2022, 12:21 PM

## 2022-01-13 NOTE — Transfer of Care (Signed)
Immediate Anesthesia Transfer of Care Note  Patient: Andres [name]  Procedure(s) Performed: TRANSESOPHAGEAL ECHOCARDIOGRAM (TEE) CARDIOVERSION  Patient Location: Cath Lab- Temp specials recovery  Anesthesia Type:General  Level of Consciousness: awake, alert , and oriented  Airway & Oxygen Therapy: Patient Spontanous Breathing and Patient connected to nasal cannula oxygen  Post-op Assessment: Report given to RN and Post -op Vital signs reviewed and stable  Post vital signs: Reviewed and stable  Last Vitals:  Vitals Value Taken Time  BP 104/50 01/13/22 1218  Temp    Pulse 53 01/13/22 1221  Resp 26 01/13/22 1221  SpO2 90 % 01/13/22 1221    Last Pain:  Vitals:   01/13/22 1124  TempSrc: Oral  PainSc: 0-No pain      Patients Stated Pain Goal: 0 (91/79/15 0569)  Complications: No notable events documented.

## 2022-01-13 NOTE — CV Procedure (Signed)
Transesophageal echocardiogram preliminary report  NOLYN SWAB [id number] May 30, 1965  Preliminary diagnosis  Atrial fibrillation with or without cardioversion  Postprocedural diagnosis  Atrial fibrillation without left atrial appendage thrombus  Time out A timeout was performed by the nursing staff and physicians specifically identifying the procedure performed, identification of the patient, the type of sedation, all allergies and medications, all pertinent medical history, and presedation assessment of nasopharynx. The patient and or family understand the risks of the procedure including the rare risks of death, stroke, heart attack, esophogeal perforation, sore throat, and reaction to medications given.  Moderate sedation During this procedure the patient has received  generall anesthesia.  The patient had continued monitoring of heart rate, oxygenation, blood pressure, respiratory rate, and extent of signs of sedation throughout the entire procedure.  The patient received this moderate sedation over a period of 15 minutes.  Both the nursing staff and I were present during the procedure when the patient had moderate sedation for 100% of the time.  Treatment considerations  Electrical cardioversion of atrial fibrillation due to no evidence of atrial appendage thrombus  For further details of transesophageal echocardiogram please refer to final report.  Signed,  Corey Skains M.D. [facility] 01/13/2022 12:20 PM

## 2022-01-13 NOTE — Progress Notes (Signed)
*  PRELIMINARY RESULTS* Echocardiogram Echocardiogram Transesophageal has been performed.  Sherrie Sport 01/13/2022, 12:31 PM

## 2022-01-13 NOTE — Consult Note (Signed)
[facility] Cardiology Consultation Note  Patient ID: Andres [name], MRN: [id number], DOB/AGE: September 23, 1965 56 y.o. Admit date: 01/10/2022   Date of Consult: 01/13/2022 Primary Physician: [facility] Primary Cardiologist: Gwen Pounds  Chief Complaint:  Chief Complaint  Patient presents with   Chest Pain    Pt c/o chest pain, new onset afib.   Reason for Consult: HPI     Chest Pain    Additional comments: Pt c/o chest pain, new onset afib.      Last edited by Nicholes Rough, RN on 01/10/2022  8:09 AM.       HPI: 56 y.o. male with known coronary disease status post previous remote stent placement paroxysmal nonvalvular atrial fibrillation hypertension hyperlipidemia having recurrent episodes of evidence of chest pain with rapid ventricular rate of atrial fibrillation.  When the patient has been seen in the emergency room there is A-fib with rapid rate but no evidence of EKG changes or acute myocardial infarction.  His chest pain usually resolves with appropriate medication management including heart rate control.  He has been placed on amiodarone load at 200 mg twice per day and has remained on Eliquis at 5 mg twice per day with atorvastatin and metoprolol.  We have discussed at length further treatment options including electrical cardioversion for which she has undergone a transesophageal echocardiogram showing normal LV systolic function with ejection fraction of 55% with moderate mitral regurgitation moderate left atrial enlargement but no evidence of atrial thrombus.  The patient does have moderate atherosclerosis of the aorta.  He underwent electrical cardioversion of atrial fibrillation to normal sinus rhythm without complication and now has sinus bradycardia.  Past Medical History:  Diagnosis Date   A-fib [facility])    CHF (congestive heart failure) (HCC)    Coronary artery disease    History of kidney stones       Surgical History:  Past Surgical History:   Procedure Laterality Date   LEFT HEART CATH AND CORONARY ANGIOGRAPHY N/A 01/06/2022   Procedure: LEFT HEART CATH AND CORONARY ANGIOGRAPHY possible PCI and stent;  Surgeon: Marcina Millard, MD;  Location: [facility] INVASIVE CV LAB;  Service: Cardiovascular;  Laterality: N/A;     Home Meds: Prior to Admission medications   Medication Sig Start Date End Date Taking? Authorizing Provider  amiodarone (PACERONE) 200 MG tablet Take 1 tablet (200 mg total) by mouth 2 (two) times daily. Until Friday, then start taking once daily 01/08/22 02/07/22 Yes Arnetha Courser, MD  apixaban (ELIQUIS) 5 MG TABS tablet Take 1 tablet (5 mg total) by mouth 2 (two) times daily. 01/04/22 02/03/22 Yes Lynn Ito, MD  aspirin EC 81 MG tablet Take 1 tablet (81 mg total) by mouth daily. Swallow whole. 01/05/22  Yes Lynn Ito, MD  atorvastatin (LIPITOR) 40 MG tablet Take 1 tablet (40 mg total) by mouth every evening. 01/08/22  Yes Arnetha Courser, MD  Cyanocobalamin 1000 MCG TBCR Take 1 tablet by mouth daily. 02/06/20  Yes [provider]  furosemide (LASIX) 20 MG tablet Take 1 tablet (20 mg total) by mouth daily. 01/09/22  Yes Arnetha Courser, MD  metoprolol tartrate 75 MG TABS Take 75 mg by mouth 2 (two) times daily. 01/08/22  Yes Arnetha Courser, MD  nitroGLYCERIN (NITROSTAT) 0.4 MG SL tablet 1 TABLET UNDER TONGUE AT ONSET OF CHEST PAIN. REPEAT IN 5 MIN IF NOT RESOLVED, MAX 3 DOSES. 911 IF NEEDED. 01/20/19   [provider]    Inpatient Medications:   [NWG Hold] amiodarone  200 mg Oral BID   [MAR Hold] apixaban  5 mg Oral BID   [MAR Hold] aspirin EC  81 mg Oral Daily   [MAR Hold] atorvastatin  40 mg Oral QPM   [MAR Hold] cyanocobalamin  1,000 mcg Oral Daily   [MAR Hold] furosemide  20 mg Oral Daily   metoprolol tartrate  25 mg Oral BID   [MAR Hold] potassium chloride  40 mEq Oral Q4H    sodium chloride 20 mL/hr at 01/13/22 1116   sodium chloride     sodium chloride      Allergies:  Allergies   Allergen Reactions   Statins Other (See Comments)    Myalgias, cannot remember which statin he took in the past but willing to try atorvastatin 12/2021    Social History   Socioeconomic History   Marital status: Married    Spouse name: Not on file   Number of children: Not on file   Years of education: Not on file   Highest education level: Not on file  Occupational History   Not on file  Tobacco Use   Smoking status: Every Day    Packs/day: 1.00    Types: Cigarettes   Smokeless tobacco: Not on file  Vaping Use   Vaping Use: Never used  Substance and Sexual Activity   Alcohol use: Not Currently   Drug use: Never   Sexual activity: Not on file  Other Topics Concern   Not on file  Social History Narrative   Not on file   Social Determinants of Health   Financial Resource Strain: Not on file  Food Insecurity: No Food Insecurity (01/10/2022)   Hunger Vital Sign    Worried About Running Out of Food in the Last Year: Never true    Ran Out of Food in the Last Year: Never true  Transportation Needs: No Transportation Needs (01/10/2022)   PRAPARE - Hydrologist (Medical): No    Lack of Transportation (Non-Medical): No  Physical Activity: Not on file  Stress: Not on file  Social Connections: Not on file  Intimate Partner Violence: Not At Risk (01/10/2022)   Humiliation, Afraid, Rape, and Kick questionnaire    Fear of Current or Ex-Partner: No    Emotionally Abused: No    Physically Abused: No    Sexually Abused: No     History reviewed. No pertinent family history.   Review of Systems Positive for shortness of breath Negative for: General:  chills, fever, night sweats or weight changes.  Cardiovascular: PND orthopnea syncope dizziness  Dermatological skin lesions rashes Respiratory: Cough congestion Urologic: Frequent urination urination at night and hematuria Abdominal: negative for nausea, vomiting, diarrhea, bright red blood per  rectum, melena, or hematemesis Neurologic: negative for visual changes, and/or hearing changes  All other systems reviewed and are otherwise negative except as noted above.  Labs: No results for input(s): "CKTOTAL", "CKMB", "TROPONINI" in the last 72 hours. Lab Results  Component Value Date   WBC 11.7 (H) 01/12/2022   HGB 15.1 01/12/2022   HCT 41.8 01/12/2022   MCV 83.4 01/12/2022   PLT 293 01/12/2022    Recent Labs  Lab 01/10/22 0805 01/11/22 0528 01/13/22 0647  NA 138   < > 136  K 4.1   < > 2.8*  CL 104   < > 95*  CO2 24   < > 28  BUN 27*   < > 30*  CREATININE 1.45*   < > 1.50*  CALCIUM 9.1   < > 9.6  PROT 7.5  --   --   BILITOT 1.3*  --   --   ALKPHOS 56  --   --   ALT 45*  --   --   AST 24  --   --   GLUCOSE 132*   < > 106*   < > = values in this interval not displayed.   Lab Results  Component Value Date   CHOL 208 (H) 10/16/2011   HDL 29 (L) 10/16/2011   LDLCALC 159 (H) 10/16/2011   TRIG 100 10/16/2011   Lab Results  Component Value Date   DDIMER 1.41 (H) 01/10/2022    Radiology/Studies:  ECHO TEE  Result Date: 01/25/2022    TRANSESOPHOGEAL ECHO REPORT   Patient Name:   Andres [name] Date of Exam: January 25, 2022 Medical Rec #:  [id number]       Height:       73.0 in Accession #:    [id number]      Weight:       243.0 lb Date of Birth:  Aug 20, 1965       BSA:          2.337 m Patient Age:    56 years        BP:           Not listed in chart/Not listed in                                               chart mmHg Patient Gender: M               HR:           53 bpm. Exam Location:  [facility] Procedure: Transesophageal Echo, Cardiac Doppler and Color Doppler Indications:     Not listed on TEE check-in sheet  History:         Patient has prior history of Echocardiogram examinations, most                  recent 01/07/2022. CHF, CAD; Arrythmias:Atrial Fibrillation.  Sonographer:     Cristela Blue Referring Phys:  [id number] Lamar Blinks Diagnosing Phys: Arnoldo Hooker MD  PROCEDURE: The transesophogeal probe was passed without difficulty through the esophogus of the patient. Sedation performed by performing physician. The patient developed no complications during the procedure. A successful direct current cardioversion was performed at 120 joules with 1 attempt. IMPRESSIONS  1. Left ventricular ejection fraction, by estimation, is 55 to 60%. The left ventricle has normal function.  2. Right ventricular systolic function is normal. The right ventricular size is normal.  3. Left atrial size was moderately dilated. No left atrial/left atrial appendage thrombus was detected.  4. The mitral valve is normal in structure. Moderate mitral valve regurgitation.  5. The aortic valve is tricuspid. Aortic valve regurgitation is trivial.  6. There is Moderate (Grade III) plaque.  7. Cannot exclude a small PFO. Agitated saline contrast bubble study was positive with shunting observed within 3-6 cardiac cycles suggestive of interatrial shunt. FINDINGS  Left Ventricle: Left ventricular ejection fraction, by estimation, is 55 to 60%. The left ventricle has normal function. The left ventricular internal cavity size was normal in size. Right Ventricle: The right ventricular size is normal. No increase in right ventricular wall thickness. Right ventricular systolic function is  normal. Left Atrium: Left atrial size was moderately dilated. Spontaneous echo contrast was present. No left atrial/left atrial appendage thrombus was detected. Right Atrium: Right atrial size was normal in size. Pericardium: There is no evidence of pericardial effusion. Mitral Valve: The mitral valve is normal in structure. Moderate mitral valve regurgitation. Tricuspid Valve: The tricuspid valve is normal in structure. Tricuspid valve regurgitation is trivial. Aortic Valve: The aortic valve is tricuspid. Aortic valve regurgitation is trivial. Pulmonic Valve: The pulmonic valve was normal in structure. Pulmonic valve regurgitation  is trivial. Aorta: The aortic root and ascending aorta are structurally normal, with no evidence of dilitation. There is moderate (Grade III) plaque. IAS/Shunts: Cannot exclude a small PFO. Agitated saline contrast was given intravenously to evaluate for intracardiac shunting. Agitated saline contrast bubble study was positive with shunting observed within 3-6 cardiac cycles suggestive of interatrial shunt. Arnoldo HookerBruce Aloise Copus MD Electronically signed by Arnoldo HookerBruce Jiovani Mccammon MD Signature Date/Time: 01/13/2022/12:43:51 PM    Final    DG Chest Port 1 View  Result Date: 01/10/2022 CLINICAL DATA:  Shortness breath EXAM: PORTABLE CHEST 1 VIEW COMPARISON:  Chest radiograph 01/09/2022 FINDINGS: No pleural effusion. No pneumothorax. Unchanged cardiac and mediastinal contours. Redemonstrated diffuse interstitial opacities, slightly increased in the bilateral upper lobes compared to prior exam. No displaced rib fractures. Visualized upper abdomen is unremarkable. IMPRESSION: Redemonstrated diffuse interstitial opacities, slightly increased in the bilateral upper lobes compared to prior exam. Findings are favored to represent pulmonary edema, but atypical infection is also a differential consideration Electronically Signed   By: Lorenza CambridgeHemant  Desai M.D.   On: 01/10/2022 08:30   DG Chest 2 View  Result Date: 01/10/2022 CLINICAL DATA:  Chest pain EXAM: CHEST - 2 VIEW COMPARISON:  01/05/2022 FINDINGS: Heart is normal size. Peribronchial thickening. Diffuse interstitial prominence throughout the lungs. No visible significant effusions. No acute bony abnormality. IMPRESSION: Peribronchial thickening with diffuse interstitial prominence could reflect interstitial edema or atypical infection. Electronically Signed   By: Charlett NoseKevin  Dover M.D.   On: 01/10/2022 00:09   ECHOCARDIOGRAM COMPLETE  Result Date: 01/07/2022    ECHOCARDIOGRAM REPORT   Patient Name:   Andres [name] Date of Exam: 01/07/2022 Medical Rec #:  [id number]       Height:        72.0 in Accession #:    [id number]      Weight:       252.0 lb Date of Birth:  10-Apr-1965       BSA:          2.350 m Patient Age:    56 years        BP:           124/87 mmHg Patient Gender: M               HR:           71 bpm. Exam Location:  [facility] Procedure: 2D Echo, Color Doppler, Cardiac Doppler and Intracardiac            Opacification Agent Indications:     I50.31 congestive heart failure-Acute Diastolic  History:         Patient has no prior history of Echocardiogram examinations.                  CAD; Arrythmias:Atrial Fibrillation.  Sonographer:     Humphrey RollsJoan Heiss Referring Phys:  [id number] AGBATA Diagnosing Phys: Arnoldo HookerBruce Kc Summerson MD  Sonographer Comments: Suboptimal apical window. IMPRESSIONS  1. Left ventricular ejection fraction, by estimation,  is 65 to 70%. The left ventricle has normal function. The left ventricle has no regional wall motion abnormalities. Left ventricular diastolic parameters were normal.  2. Right ventricular systolic function is normal. The right ventricular size is normal.  3. Left atrial size was mildly dilated.  4. The mitral valve is normal in structure. Moderate mitral valve regurgitation.  5. The aortic valve is normal in structure. Aortic valve regurgitation is trivial. FINDINGS  Left Ventricle: Left ventricular ejection fraction, by estimation, is 65 to 70%. The left ventricle has normal function. The left ventricle has no regional wall motion abnormalities. Definity contrast agent was given IV to delineate the left ventricular  endocardial borders. The left ventricular internal cavity size was normal in size. There is no left ventricular hypertrophy. Left ventricular diastolic parameters were normal. Right Ventricle: The right ventricular size is normal. No increase in right ventricular wall thickness. Right ventricular systolic function is normal. Left Atrium: Left atrial size was mildly dilated. Right Atrium: Right atrial size was normal in size. Pericardium: There is  no evidence of pericardial effusion. Mitral Valve: The mitral valve is normal in structure. Moderate mitral valve regurgitation. Tricuspid Valve: The tricuspid valve is normal in structure. Tricuspid valve regurgitation is mild. Aortic Valve: The aortic valve is normal in structure. Aortic valve regurgitation is trivial. Aortic valve mean gradient measures 3.0 mmHg. Aortic valve peak gradient measures 5.9 mmHg. Aortic valve area, by VTI measures 2.55 cm. Pulmonic Valve: The pulmonic valve was normal in structure. Pulmonic valve regurgitation is trivial. Aorta: The aortic root and ascending aorta are structurally normal, with no evidence of dilitation. IAS/Shunts: No atrial level shunt detected by color flow Doppler.  LEFT VENTRICLE PLAX 2D LVIDd:         5.10 cm LVIDs:         2.70 cm LV PW:         1.10 cm LV IVS:        1.20 cm LVOT diam:     2.30 cm LV SV:         52 LV SV Index:   22 LVOT Area:     4.15 cm  RIGHT VENTRICLE RV Basal diam:  4.00 cm LEFT ATRIUM             Index        RIGHT ATRIUM           Index LA diam:        5.10 cm 2.17 cm/m   RA Area:     19.90 cm LA Vol (A2C):   49.8 ml 21.19 ml/m  RA Volume:   53.90 ml  22.93 ml/m LA Vol (A4C):   58.7 ml 24.97 ml/m LA Biplane Vol: 54.7 ml 23.27 ml/m  AORTIC VALVE                    PULMONIC VALVE AV Area (Vmax):    2.75 cm     PV Vmax:          0.83 m/s AV Area (Vmean):   2.55 cm     PV Vmean:         57.700 cm/s AV Area (VTI):     2.55 cm     PV VTI:           0.138 m AV Vmax:           121.00 cm/s  PV Peak grad:     2.8 mmHg AV Vmean:  84.100 cm/s  PV Mean grad:     2.0 mmHg AV VTI:            0.202 m      PR End Diast Vel: 13.25 msec AV Peak Grad:      5.9 mmHg AV Mean Grad:      3.0 mmHg LVOT Vmax:         80.00 cm/s LVOT Vmean:        51.700 cm/s LVOT VTI:          0.124 m LVOT/AV VTI ratio: 0.61  AORTA Ao Root diam: 3.10 cm MITRAL VALVE MV Area (PHT): 2.86 cm     SHUNTS MV Decel Time: 265 msec     Systemic VTI:  0.12 m MV E  velocity: 176.00 cm/s  Systemic Diam: 2.30 cm Arnoldo Hooker MD Electronically signed by Arnoldo Hooker MD Signature Date/Time: 01/07/2022/12:21:08 PM    Final    CARDIAC CATHETERIZATION  Result Date: 01/06/2022   Prox RCA lesion is 20% stenosed.   Mid RCA lesion is 20% stenosed.   Dist RCA lesion is 40% stenosed.   RPAV lesion is 40% stenosed.   Prox Cx lesion is 50% stenosed.   2nd Mrg lesion is 50% stenosed.   1st Mrg lesion is 30% stenosed.   Prox LAD lesion is 50% stenosed.   1st Diag lesion is 60% stenosed.   Previously placed Prox LAD to Mid LAD stent of unknown type is  widely patent.   The left ventricular systolic function is normal.   The left ventricular ejection fraction is 50-55% by visual estimate. 1.  Diffuse moderate nonobstructive coronary artery disease with patent stent proximal/mid LAD, 50% stenosis proximal LAD, 50% stenosis proximal left circumflex, 40% distal RCA 2.  Normal left ventricular function with estimated LVEF 50-55% Recommendations 1.  Medical therapy 2.  Resume Eliquis 01/07/2022   CT Angio Chest PE W/Cm &/Or Wo Cm  Result Date: 01/05/2022 CLINICAL DATA:  56 year old male with suspected pulmonary embolism. EXAM: CT ANGIOGRAPHY CHEST WITH CONTRAST TECHNIQUE: Multidetector CT imaging of the chest was performed using the standard protocol during bolus administration of intravenous contrast. Multiplanar CT image reconstructions and MIPs were obtained to evaluate the vascular anatomy. RADIATION DOSE REDUCTION: This exam was performed according to the departmental dose-optimization program which includes automated exposure control, adjustment of the mA and/or kV according to patient size and/or use of iterative reconstruction technique. CONTRAST:  24mL OMNIPAQUE IOHEXOL 350 MG/ML SOLN COMPARISON:  No priors. FINDINGS: Cardiovascular: There are no filling defects within the pulmonary arterial tree to suggest pulmonary embolism. Heart size is mildly enlarged. There is no  significant pericardial fluid, thickening or pericardial calcification. There is aortic atherosclerosis, as well as atherosclerosis of the great vessels of the mediastinum and the coronary arteries, including calcified atherosclerotic plaque in the left main, left anterior descending, left circumflex and right coronary arteries. Mediastinum/Nodes: Multiple prominent borderline enlarged and mildly enlarged mediastinal and hilar lymph nodes are noted, with the largest mediastinal lymph node measuring up to 2 cm in short axis in the subcarinal nodal station and 1.5 cm in the prevascular nodal station. Esophagus is unremarkable in appearance. No axillary lymphadenopathy. Lungs/Pleura: Diffuse ground-glass attenuation and widespread interlobular septal thickening is noted throughout the lungs bilaterally, suggesting a background of interstitial pulmonary edema. No confluent consolidative airspace disease. No pleural effusions. No definite suspicious appearing pulmonary nodules or masses are noted. Mild centrilobular and paraseptal emphysema predominantly in the lung apices. Upper Abdomen: Aortic  atherosclerosis. Musculoskeletal: Healing nondisplaced fracture of the lateral aspect of the left sixth rib. There are no aggressive appearing lytic or blastic lesions noted in the visualized portions of the skeleton. Review of the MIP images confirms the above findings. IMPRESSION: 1. Cardiomegaly with evidence of interstitial pulmonary edema; imaging findings suggesting underlying congestive heart failure. 2. Multiple prominent borderline and mildly enlarged mediastinal and bilateral hilar lymph nodes. This is nonspecific in the setting of congestive heart failure, but follow-up contrast enhanced chest CT is recommended in 1 month after resolution of the patient's acute illness to re-evaluate these findings and ensure regression. Lymphoproliferative disease or other malignancy is not excluded. 3. Aortic atherosclerosis, in  addition to left main and three-vessel coronary artery disease. Please note that although the presence of coronary artery calcium documents the presence of coronary artery disease, the severity of this disease and any potential stenosis cannot be assessed on this non-gated CT examination. Assessment for potential risk factor modification, dietary therapy or pharmacologic therapy may be warranted, if clinically indicated. 4. Healing nondisplaced fracture of the lateral aspect of the left sixth rib. Aortic Atherosclerosis (ICD10-I70.0). Electronically Signed   By: Trudie Reed M.D.   On: 01/05/2022 05:14   DG Chest 2 View  Result Date: 01/05/2022 CLINICAL DATA:  56 year old male with history of chest pain and shortness of breath. EXAM: CHEST - 2 VIEW COMPARISON:  Chest x-ray 01/02/2022. FINDINGS: There is cephalization of the pulmonary vasculature and slight indistinctness of the interstitial markings suggestive of mild pulmonary edema. No definite pleural effusions. No pneumothorax. Heart size is mildly enlarged. Upper mediastinal contours are within normal limits. IMPRESSION: 1. The appearance of the chest is most suggestive of congestive heart failure, as above. Electronically Signed   By: Trudie Reed M.D.   On: 01/05/2022 05:00   DG Chest Port 1 View  Result Date: 01/03/2022 CLINICAL DATA:  Chest pain EXAM: PORTABLE CHEST 1 VIEW COMPARISON:  01/24/2020 FINDINGS: Lungs are clear.  No pleural effusion or pneumothorax. The heart is normal in size. IMPRESSION: No evidence of acute cardiopulmonary disease. Electronically Signed   By: Charline Bills M.D.   On: 01/03/2022 00:02    EKG: Normal sinus rhythm with nonspecific ST changes  Weights: Filed Weights   01/10/22 0812 01/13/22 0606 01/13/22 1124  Weight: 119.5 kg 108.3 kg 110.2 kg     Physical Exam: Blood pressure 111/86, pulse (!) 56, temperature (!) 97.4 F (36.3 C), temperature source Oral, resp. rate 16, height  (1.854 m),  weight 110.2 kg, SpO2 96 %. Body mass index is 32.06 kg/m. General: Well developed, well nourished, in no acute distress. Head eyes ears nose throat: Normocephalic, atraumatic, sclera non-icteric, no xanthomas, nares are without discharge. No apparent thyromegaly and/or mass  Lungs: Normal respiratory effort.  no wheezes, no rales, no rhonchi.  Heart: RRR with normal S1 S2. no murmur gallop, no rub, PMI is normal size and placement, carotid upstroke normal without bruit, jugular venous pressure is normal Abdomen: Soft, non-tender, non-distended with normoactive bowel sounds. No hepatomegaly. No rebound/guarding. No obvious abdominal masses. Abdominal aorta is normal size without bruit Extremities: No edema. no cyanosis, no clubbing, no ulcers  Peripheral : 2+ bilateral upper extremity pulses, 2+ bilateral femoral pulses, 2+ bilateral dorsal pedal pulse Neuro: Alert and oriented. No facial asymmetry. No focal deficit. Moves all extremities spontaneously. Musculoskeletal: Normal muscle tone without kyphosis Psych:  Responds to questions appropriately with a normal affect.    Assessment: 56 year old male with hypertension hyperlipidemia  coronary artery disease status post previous stent placements with atrial fibrillation rapid ventricular rate and acute diastolic dysfunction heart failure without acute myocardial infarction now electrically cardioversion to normal sinus rhythm successfully without complication  Plan: 1.  Decrease metoprolol to 25 mg twice per day for heart rate control of atrial fibrillation and avoidance of bradycardia 2.  Continue amiodarone 200 mg twice per day for maintenance of normal sinus rhythm and adjustments as outpatient 3.  Continue Eliquis 5 mg twice per day for further risk reduction of stroke with atrial fibrillation 4.  High intensity cholesterol therapy 5.  No further cardiac diagnostics necessary at the time 6.  Okay for discharge home from cardiac standpoint  with follow-up in 2 weeks  Signed, Lamar Blinks M.D. [facility] Cardiology 01/13/2022, 12:59 PM

## 2022-01-13 NOTE — Anesthesia Procedure Notes (Signed)
Procedure Name: MAC Date/Time: 01/13/2022 12:11 PM  Performed by: Lily Peer, Keagan Brislin, CRNAPre-anesthesia Checklist: Patient identified, Emergency Drugs available, Suction available, Patient being monitored and Timeout performed Patient Re-evaluated:Patient Re-evaluated prior to induction Oxygen Delivery Method: Nasal cannula Induction Type: IV induction

## 2022-01-13 NOTE — Discharge Instructions (Signed)

## 2022-01-14 ENCOUNTER — Encounter: Payer: Self-pay | Admitting: Internal Medicine

## 2022-01-14 ENCOUNTER — Ambulatory Visit: Payer: BC Managed Care – PPO | Admitting: Family

## 2022-01-15 DIAGNOSIS — Q2112 Patent foramen ovale: Secondary | ICD-10-CM | POA: Diagnosis not present

## 2022-01-15 DIAGNOSIS — I480 Paroxysmal atrial fibrillation: Secondary | ICD-10-CM | POA: Diagnosis not present

## 2022-01-15 DIAGNOSIS — Z79899 Other long term (current) drug therapy: Secondary | ICD-10-CM | POA: Diagnosis not present

## 2022-01-16 ENCOUNTER — Other Ambulatory Visit: Payer: Self-pay

## 2022-01-16 ENCOUNTER — Encounter: Payer: Self-pay | Admitting: Emergency Medicine

## 2022-01-16 ENCOUNTER — Emergency Department
Admission: EM | Admit: 2022-01-16 | Discharge: 2022-01-16 | Disposition: A | Discharge status: Home / Self Care | Payer: BC Managed Care – PPO | Attending: Emergency Medicine | Admitting: Emergency Medicine

## 2022-01-16 DIAGNOSIS — Z7901 Long term (current) use of anticoagulants: Secondary | ICD-10-CM | POA: Insufficient documentation

## 2022-01-16 DIAGNOSIS — E876 Hypokalemia: Secondary | ICD-10-CM | POA: Insufficient documentation

## 2022-01-16 DIAGNOSIS — I4891 Unspecified atrial fibrillation: Secondary | ICD-10-CM | POA: Insufficient documentation

## 2022-01-16 DIAGNOSIS — I2510 Atherosclerotic heart disease of native coronary artery without angina pectoris: Secondary | ICD-10-CM | POA: Insufficient documentation

## 2022-01-16 DIAGNOSIS — R799 Abnormal finding of blood chemistry, unspecified: Secondary | ICD-10-CM | POA: Diagnosis not present

## 2022-01-16 DIAGNOSIS — I509 Heart failure, unspecified: Secondary | ICD-10-CM | POA: Diagnosis not present

## 2022-01-16 LAB — CBC WITH DIFFERENTIAL/PLATELET
Abs Immature Granulocytes: 0.08 10*3/uL — ABNORMAL HIGH (ref 0.00–0.07)
Basophils Absolute: 0.1 10*3/uL (ref 0.0–0.1)
Basophils Relative: 1 %
Eosinophils Absolute: 0.4 10*3/uL (ref 0.0–0.5)
Eosinophils Relative: 3 %
HCT: 41.5 % (ref 39.0–52.0)
Hemoglobin: 14.5 g/dL (ref 13.0–17.0)
Immature Granulocytes: 1 %
Lymphocytes Relative: 19 %
Lymphs Abs: 2.7 10*3/uL (ref 0.7–4.0)
MCH: 29.3 pg (ref 26.0–34.0)
MCHC: 34.9 g/dL (ref 30.0–36.0)
MCV: 83.8 fL (ref 80.0–100.0)
Monocytes Absolute: 1.4 10*3/uL — ABNORMAL HIGH (ref 0.1–1.0)
Monocytes Relative: 10 %
Neutro Abs: 9.6 10*3/uL — ABNORMAL HIGH (ref 1.7–7.7)
Neutrophils Relative %: 66 %
Platelets: 331 10*3/uL (ref 150–400)
RBC: 4.95 MIL/uL (ref 4.22–5.81)
RDW: 13.2 % (ref 11.5–15.5)
WBC: 14.2 10*3/uL — ABNORMAL HIGH (ref 4.0–10.5)
nRBC: 0 % (ref 0.0–0.2)

## 2022-01-16 LAB — BASIC METABOLIC PANEL
Anion gap: 13 (ref 5–15)
BUN: 24 mg/dL — ABNORMAL HIGH (ref 6–20)
CO2: 27 mmol/L (ref 22–32)
Calcium: 9.7 mg/dL (ref 8.9–10.3)
Chloride: 96 mmol/L — ABNORMAL LOW (ref 98–111)
Creatinine, Ser: 1.52 mg/dL — ABNORMAL HIGH (ref 0.61–1.24)
GFR, Estimated: 53 mL/min — ABNORMAL LOW (ref >60)
Glucose, Bld: 97 mg/dL (ref 70–99)
Potassium: 2.6 mmol/L — CL (ref 3.5–5.1)
Sodium: 136 mmol/L (ref 135–145)

## 2022-01-16 LAB — MAGNESIUM: Magnesium: 1.9 mg/dL (ref 1.7–2.4)

## 2022-01-16 MED ORDER — POTASSIUM CHLORIDE 10 MEQ/100ML IV SOLN
10.0000 meq | Freq: Once | INTRAVENOUS | Status: AC
  Administered 2022-01-16: 10 meq via INTRAVENOUS
  Filled 2022-01-16: qty 100

## 2022-01-16 MED ORDER — POTASSIUM CHLORIDE CRYS ER 20 MEQ PO TBCR
40.0000 meq | EXTENDED_RELEASE_TABLET | Freq: Once | ORAL | Status: AC
  Administered 2022-01-16: 40 meq via ORAL
  Filled 2022-01-16: qty 2

## 2022-01-16 NOTE — Discharge Instructions (Addendum)
Continue taking your potassium supplements as prescribed.  Reviewed the instructions attached for dietary recommendations for foods rich in potassium.  Follow-up with your doctor this week to recheck your potassium levels and to adjust medications as appropriate. Give your primary doctor or Dr Etta Quill office a call this morning to get blood testing scheduled before your appointment next week.   Thank you for choosing [place] for your health care today!  Please see your primary doctor this week for a follow up appointment.   If you do not have a primary doctor call the following clinics to establish care:  If you have insurance:  [facility] [phone] [place] [address]   [facility]  [phone] [place] [address]   If you do not have insurance:  [facility]  [phone] [address], [place] [address]  Sometimes, in the early stages of certain disease courses it is difficult to detect in the emergency department evaluation -- so, it is important that you continue to monitor your symptoms and call your doctor right away or return to the emergency department if you develop any new or worsening symptoms.  It was my pleasure to care for you today.   Hoover Brunette Jacelyn Grip, MD

## 2022-01-16 NOTE — ED Provider Notes (Signed)
[facility] Provider Note    Event Date/Time   First MD Initiated Contact with Patient 01/16/22 0017     (approximate)   History   Abnormal Lab   HPI  Andres [name] is a 56 y.o. male   Past medical history of fibrillation on Eliquis, CHF, CAD, kidney stones who presents to the emergency department with hypokalemia called by his clinic physician to come to the emergency department.  He is otherwise feeling well, has no complaints, no dyspnea, chest pain, palpitations and denies nausea vomiting or diarrhea.  Has been taking all of his medications as prescribed.  Of note, was recently seen in the hospital for cardioversion of his atrial fibrillation and found to be hypokalemic and started on potassium pills.  Thought to be in the setting of his diuresis.    History was obtained via the patient   Independent historian given collateral information includes his wife.    Also review of external medical notes including discharge summary from earlier this month detailing atrial fibrillation with cardioversion and hypokalemia in setting of diuresis      Physical Exam   Triage Vital Signs: ED Triage Vitals [01/16/22 0014]  Enc Vitals Group     BP      Pulse      Resp      Temp      Temp src      SpO2      Weight 242 lb 15.2 oz (110.2 kg)     Height 6\' 1"  (1.854 m)     Head Circumference      Peak Flow      Pain Score 0     Pain Loc      Pain Edu?      Excl. in GC?     Most recent vital signs: Vitals:   01/16/22 0330 01/16/22 0400  BP: (!) 113/56 113/62  Pulse: (!) 54   Resp: 16 17  Temp: 98.7 F (37.1 C)   SpO2: 95%     General: Awake, no distress.  CV:  Good peripheral perfusion.  Resp:  Normal effort.  Lungs clear Abd:  No distention.  Nontender Other:  Dynamics appropriate and reassuring   ED Results / Procedures / Treatments   Labs (all labs ordered are listed, but only abnormal results are displayed) Labs Reviewed  BASIC  METABOLIC PANEL - Abnormal; Notable for the following components:      Result Value   Potassium 2.6 (*)    Chloride 96 (*)    BUN 24 (*)    Creatinine, Ser 1.52 (*)    GFR, Estimated 53 (*)    All other components within normal limits  CBC WITH DIFFERENTIAL/PLATELET - Abnormal; Notable for the following components:   WBC 14.2 (*)    Neutro Abs 9.6 (*)    Monocytes Absolute 1.4 (*)    Abs Immature Granulocytes 0.08 (*)    All other components within normal limits  MAGNESIUM     I reviewed labs and they are notable for 2.6 K and normal Mg  EKG  ED ECG REPORT I, 01/18/22, the attending physician, personally viewed and interpreted this ECG.   Date: 01/16/2022  EKG Time: 0039  Rate: 54  Rhythm: normal EKG, normal sinus rhythm  Axis: nl  Intervals:long qtc 600, no ischemic changes, min ST depressions in lateral leads  PROCEDURES:  Critical Care performed: Yes, see critical care procedure note(s)  .Critical Care  Performed  by: Pilar Jarvis, MD Authorized by: Pilar Jarvis, MD   Critical care provider statement:    Critical care time (minutes):  30   Critical care was necessary to treat or prevent imminent or life-threatening deterioration of the following conditions:  Metabolic crisis (Severe hypokalemia requiring IV repletion)   Critical care was time spent personally by me on the following activities:  Development of treatment plan with patient or surrogate, discussions with consultants, evaluation of patient's response to treatment, examination of patient, ordering and review of laboratory studies, ordering and review of radiographic studies, ordering and performing treatments and interventions, pulse oximetry, re-evaluation of patient's condition and review of old charts    MEDICATIONS ORDERED IN ED: Medications  potassium chloride 10 mEq in 100 mL IVPB (0 mEq Intravenous Stopped 01/16/22 0148)  potassium chloride SA (KLOR-CON M) CR tablet 40 mEq (40 mEq Oral Given  01/16/22 0048)  potassium chloride 10 mEq in 100 mL IVPB (10 mEq Intravenous New Bag/Given 01/16/22 0245)  potassium chloride SA (KLOR-CON M) CR tablet 40 mEq (40 mEq Oral Given 01/16/22 0218)     IMPRESSION / MDM / ASSESSMENT AND PLAN / ED COURSE  I reviewed the triage vital signs and the nursing notes.                              Differential diagnosis includes, but is not limited to, hypokalemia in the setting of diuretic use, with some associated EKG changes and prolonged QTc and minimal ST depressions, patient without chest pain to suggest ACS and has no other exacerbating symptoms like vomiting or diarrhea to explain his hypokalemia.  We will recheck labs including magnesium and replete as needed, recheck EKG for resolution of associated EKG changes.    I ordered repletion of his potassium including IV potassium and PO potassium.   4:41 AM A re-check of his ECG at this time shows resolution of prior QTC prolongation and lateral STD.  ED ECG REPORT I, Pilar Jarvis, the attending physician, personally viewed and interpreted this ECG.   Date: 01/16/2022  EKG Time: 0424  Rate: 54  Rhythm: normal EKG, normal sinus rhythm  Axis: nl  Intervals: QTC 472 and no ST changes   The patient is on the cardiac monitor to evaluate for evidence of arrhythmia and/or significant heart rate changes.   Dispo: After careful consideration of this patient's presentation, medical and social risk factors, and evaluation in the emergency department I engaged in shared decision making with the patient and/or their representative to consider admission or observation and this patient was ultimately discharged because he has close follow-up with cardiologist and I informed the patient to call his primary doctor or cardiologist to get repeat labs prior to the appointment next Wednesday.    He feels well and had his potassium repleted 10 mEq x 2 via IV as well as 40 mEq x 2 via p.o with normalization of EKG  changes.  I considered admission for ongoing cardiac monitoring, recheck of potassium, but given that the patient feels well and tolerated repletion with normalization of EKG and has close follow-up, he agreed that discharge at this time with follow-up as scheduled and strict return precautions was preferred.  Patient's presentation is most consistent with acute presentation with potential threat to life or bodily function.       FINAL CLINICAL IMPRESSION(S) / ED DIAGNOSES   Final diagnoses:  Hypokalemia     Rx /  DC Orders   ED Discharge Orders     None        Note:  This document was prepared using Dragon voice recognition software and may include unintentional dictation errors.    Lucillie Garfinkel, MD 01/16/22 [phone]

## 2022-01-16 NOTE — ED Notes (Signed)
Pt continues to c/o pain/burning of KCL infusion.. rate slowed to 55ml/hr

## 2022-01-16 NOTE — ED Triage Notes (Signed)
Patient ambulatory to triage with steady gait, without difficulty or distress noted; pt reports going for f/u with PCP today and was called tonight to inform him to come to ED for hypokalemia; currently taking furosemide and Kdur

## 2022-01-17 DIAGNOSIS — E876 Hypokalemia: Secondary | ICD-10-CM | POA: Diagnosis not present

## 2022-01-17 NOTE — Progress Notes (Deleted)
   Patient ID: Andres [name], male    DOB: [birth year], 56 y.o.   MRN: [id number]  HPI  [name] is a 56 y/o male with a history of  TEE 01/13/22 showed EF 55-60%, moderate LAE, NO thrombus, moderate grade III plaque, can not exclude small PFO. Successful direct current cardioversion.  Echo report from 01/07/22 showed EF 65-70% with mild LAE and moderate MR.   LHC and angiography done 01/06/22 and showed:   Prox RCA lesion is 20% stenosed.   Mid RCA lesion is 20% stenosed.   Dist RCA lesion is 40% stenosed.   RPAV lesion is 40% stenosed.   Prox Cx lesion is 50% stenosed.   2nd Mrg lesion is 50% stenosed.   1st Mrg lesion is 30% stenosed.   Prox LAD lesion is 50% stenosed.   1st Diag lesion is 60% stenosed.   Previously placed Prox LAD to Mid LAD stent of unknown type is  widely patent.   The left ventricular systolic function is normal.   The left ventricular ejection fraction is 50-55% by visual estimate.  1.  Diffuse moderate nonobstructive coronary artery disease with patent stent proximal/mid LAD, 50% stenosis proximal LAD, 50% stenosis proximal left circumflex, 40% distal RCA 2.  Normal left ventricular function with estimated LVEF 50-55%  Was in the ED 01/16/22 due to hypokalemia with a potassium of 2.5 reported by his PCP. EKG Rate: 54  Rhythm: normal EKG, normal sinus rhythm  Axis: nl Intervals:long qtc 600, no ischemic changes, min ST depressions in lateral leads. A re-check of his ECG shows resolution of prior QTC prolongation and lateral STD. IV/PO potassium given and he was released. Admitted 01/10/22 due to SOB with AF RVR. Cardiology consult obtained. Started on diltiazem gtt which was stopped after 1 day. Echo done with successful cardioversion. CXR showed pulmonary edema. Given metolazone and IV lasix. Transitioned to oral lasix with potassium. Discharged after 3 days. Admitted 01/05/22 due to SOB/ chest pain. Needed 5L oxygen due to hypoxia. CXR showed pulmonary edema and  lasix started. Cardiology consult obtained. Patient underwent left heart catheterization on 01/06/2022 which shows Diffuse moderate nonobstructive coronary artery disease with patent stent proximal/mid LAD, 50% stenosis proximal LAD, 50% stenosis proximal left circumflex, 40% distal RCA.  Widely patent previously placed stent in LAD. Short runs of NSVT & intermittent AF/RVR and metoprolol increased. Weaned off oxygen. Discharged after 3 days.   He presents today for his initial visit with a chief complaint of  Review of Systems    Physical Exam  Assessment & Plan:  1: Chronic heart failure with preserved ejection fraction with structural changes (LAE)- - NYHA class - on GDMT of  - BNP 01/10/22 was 1214.8 - repeat this today  2: Atrial fibrillation- -  3: Hypokalemia- - saw PCP @ [facility]  01/15/22 - BMP 01/16/22 reviewed and showed sodium 136, potassium 2.6, creatinine 1.52 and GFR 53 - repeat BMP today  4: Tobacco use- -  5: CAD- - previous stent placement in LAD in 2013

## 2022-01-19 ENCOUNTER — Emergency Department: Payer: BC Managed Care – PPO

## 2022-01-19 ENCOUNTER — Emergency Department
Admission: EM | Admit: 2022-01-19 | Discharge: 2022-01-19 | Disposition: A | Discharge status: Home / Self Care | Payer: BC Managed Care – PPO | Source: Home / Self Care | Attending: Emergency Medicine | Admitting: Emergency Medicine

## 2022-01-19 ENCOUNTER — Other Ambulatory Visit: Payer: Self-pay

## 2022-01-19 DIAGNOSIS — Z20822 Contact with and (suspected) exposure to covid-19: Secondary | ICD-10-CM | POA: Diagnosis not present

## 2022-01-19 DIAGNOSIS — L409 Psoriasis, unspecified: Secondary | ICD-10-CM | POA: Diagnosis not present

## 2022-01-19 DIAGNOSIS — Z6833 Body mass index (BMI) 33.0-33.9, adult: Secondary | ICD-10-CM | POA: Diagnosis not present

## 2022-01-19 DIAGNOSIS — J811 Chronic pulmonary edema: Secondary | ICD-10-CM | POA: Diagnosis not present

## 2022-01-19 DIAGNOSIS — E669 Obesity, unspecified: Secondary | ICD-10-CM | POA: Diagnosis not present

## 2022-01-19 DIAGNOSIS — I10 Essential (primary) hypertension: Secondary | ICD-10-CM | POA: Diagnosis not present

## 2022-01-19 DIAGNOSIS — R600 Localized edema: Secondary | ICD-10-CM | POA: Insufficient documentation

## 2022-01-19 DIAGNOSIS — E785 Hyperlipidemia, unspecified: Secondary | ICD-10-CM | POA: Diagnosis not present

## 2022-01-19 DIAGNOSIS — E876 Hypokalemia: Secondary | ICD-10-CM | POA: Diagnosis not present

## 2022-01-19 DIAGNOSIS — I110 Hypertensive heart disease with heart failure: Secondary | ICD-10-CM | POA: Diagnosis not present

## 2022-01-19 DIAGNOSIS — I5023 Acute on chronic systolic (congestive) heart failure: Secondary | ICD-10-CM | POA: Diagnosis not present

## 2022-01-19 DIAGNOSIS — I4891 Unspecified atrial fibrillation: Secondary | ICD-10-CM | POA: Diagnosis not present

## 2022-01-19 DIAGNOSIS — Z79899 Other long term (current) drug therapy: Secondary | ICD-10-CM | POA: Diagnosis not present

## 2022-01-19 DIAGNOSIS — I252 Old myocardial infarction: Secondary | ICD-10-CM | POA: Diagnosis not present

## 2022-01-19 DIAGNOSIS — I509 Heart failure, unspecified: Secondary | ICD-10-CM | POA: Diagnosis not present

## 2022-01-19 DIAGNOSIS — Z955 Presence of coronary angioplasty implant and graft: Secondary | ICD-10-CM | POA: Diagnosis not present

## 2022-01-19 DIAGNOSIS — R001 Bradycardia, unspecified: Secondary | ICD-10-CM | POA: Diagnosis not present

## 2022-01-19 DIAGNOSIS — J90 Pleural effusion, not elsewhere classified: Secondary | ICD-10-CM | POA: Diagnosis not present

## 2022-01-19 DIAGNOSIS — I255 Ischemic cardiomyopathy: Secondary | ICD-10-CM | POA: Diagnosis present

## 2022-01-19 DIAGNOSIS — I480 Paroxysmal atrial fibrillation: Secondary | ICD-10-CM | POA: Diagnosis not present

## 2022-01-19 DIAGNOSIS — Z87891 Personal history of nicotine dependence: Secondary | ICD-10-CM | POA: Diagnosis not present

## 2022-01-19 DIAGNOSIS — I5033 Acute on chronic diastolic (congestive) heart failure: Secondary | ICD-10-CM | POA: Diagnosis not present

## 2022-01-19 DIAGNOSIS — Z7982 Long term (current) use of aspirin: Secondary | ICD-10-CM | POA: Diagnosis not present

## 2022-01-19 DIAGNOSIS — I2510 Atherosclerotic heart disease of native coronary artery without angina pectoris: Secondary | ICD-10-CM | POA: Diagnosis not present

## 2022-01-19 DIAGNOSIS — R0602 Shortness of breath: Secondary | ICD-10-CM | POA: Diagnosis not present

## 2022-01-19 DIAGNOSIS — Z7901 Long term (current) use of anticoagulants: Secondary | ICD-10-CM | POA: Diagnosis not present

## 2022-01-19 DIAGNOSIS — Z91148 Patient's other noncompliance with medication regimen for other reason: Secondary | ICD-10-CM | POA: Diagnosis not present

## 2022-01-19 LAB — URINALYSIS, ROUTINE W REFLEX MICROSCOPIC
Bilirubin Urine: NEGATIVE
Glucose, UA: NEGATIVE mg/dL
Hgb urine dipstick: NEGATIVE
Ketones, ur: NEGATIVE mg/dL
Leukocytes,Ua: NEGATIVE
Nitrite: NEGATIVE
Protein, ur: NEGATIVE mg/dL
Specific Gravity, Urine: 1.018 (ref 1.005–1.030)
pH: 5 (ref 5.0–8.0)

## 2022-01-19 LAB — CBC WITH DIFFERENTIAL/PLATELET
Abs Immature Granulocytes: 0.07 10*3/uL (ref 0.00–0.07)
Basophils Absolute: 0.1 10*3/uL (ref 0.0–0.1)
Basophils Relative: 1 %
Eosinophils Absolute: 0.3 10*3/uL (ref 0.0–0.5)
Eosinophils Relative: 2 %
HCT: 39 % (ref 39.0–52.0)
Hemoglobin: 13.6 g/dL (ref 13.0–17.0)
Immature Granulocytes: 1 %
Lymphocytes Relative: 14 %
Lymphs Abs: 1.8 10*3/uL (ref 0.7–4.0)
MCH: 29.8 pg (ref 26.0–34.0)
MCHC: 34.9 g/dL (ref 30.0–36.0)
MCV: 85.3 fL (ref 80.0–100.0)
Monocytes Absolute: 0.9 10*3/uL (ref 0.1–1.0)
Monocytes Relative: 7 %
Neutro Abs: 9.4 10*3/uL — ABNORMAL HIGH (ref 1.7–7.7)
Neutrophils Relative %: 75 %
Platelets: 253 10*3/uL (ref 150–400)
RBC: 4.57 MIL/uL (ref 4.22–5.81)
RDW: 13.6 % (ref 11.5–15.5)
WBC: 12.5 10*3/uL — ABNORMAL HIGH (ref 4.0–10.5)
nRBC: 0 % (ref 0.0–0.2)

## 2022-01-19 LAB — COMPREHENSIVE METABOLIC PANEL
ALT: 24 U/L (ref 0–44)
AST: 17 U/L (ref 15–41)
Albumin: 3.7 g/dL (ref 3.5–5.0)
Alkaline Phosphatase: 63 U/L (ref 38–126)
Anion gap: 9 (ref 5–15)
BUN: 21 mg/dL — ABNORMAL HIGH (ref 6–20)
CO2: 28 mmol/L (ref 22–32)
Calcium: 9.1 mg/dL (ref 8.9–10.3)
Chloride: 100 mmol/L (ref 98–111)
Creatinine, Ser: 1.31 mg/dL — ABNORMAL HIGH (ref 0.61–1.24)
GFR, Estimated: >60 mL/min (ref >60)
Glucose, Bld: 122 mg/dL — ABNORMAL HIGH (ref 70–99)
Potassium: 3.5 mmol/L (ref 3.5–5.1)
Sodium: 137 mmol/L (ref 135–145)
Total Bilirubin: 1.3 mg/dL — ABNORMAL HIGH (ref 0.3–1.2)
Total Protein: 7 g/dL (ref 6.5–8.1)

## 2022-01-19 LAB — TROPONIN I (HIGH SENSITIVITY)
Troponin I (High Sensitivity): 11 ng/L (ref <18)
Troponin I (High Sensitivity): 11 ng/L (ref <18)

## 2022-01-19 LAB — BRAIN NATRIURETIC PEPTIDE: B Natriuretic Peptide: 1275.5 pg/mL — ABNORMAL HIGH (ref 0.0–100.0)

## 2022-01-19 MED ORDER — MAGNESIUM SULFATE IN D5W 1-5 GM/100ML-% IV SOLN
1.0000 g | Freq: Once | INTRAVENOUS | Status: AC
  Administered 2022-01-19: 1 g via INTRAVENOUS
  Filled 2022-01-19: qty 100

## 2022-01-19 MED ORDER — POTASSIUM CHLORIDE CRYS ER 20 MEQ PO TBCR
40.0000 meq | EXTENDED_RELEASE_TABLET | Freq: Once | ORAL | Status: AC
  Administered 2022-01-19: 40 meq via ORAL
  Filled 2022-01-19: qty 2

## 2022-01-19 MED ORDER — FUROSEMIDE 10 MG/ML IJ SOLN
20.0000 mg | Freq: Once | INTRAMUSCULAR | Status: AC
  Administered 2022-01-19: 20 mg via INTRAVENOUS
  Filled 2022-01-19: qty 4

## 2022-01-19 NOTE — Discharge Instructions (Signed)
You are seen in the emergency department for shortness of breath.  You were given an extra dose of Lasix in the emergency department.  Your potassium level today was 3.5.  You are given oral potassium and IV magnesium.  You were given an IV dose of your Lasix.  Restart your Lasix taking 20 mg daily.  Eat plenty of fluids that are high in potassium and magnesium.  Follow-up closely with your primary care physician and your heart failure team to have your electrolytes rechecked and make sure that you are on the correct dose of Lasix.  Return to the emergency department for any worsening symptoms.  Your potassium was mildly low when checked today.  Make sure to follow up with a primary doctor to follow up your labs.  Make sure to eat food high in potassium and magnesium - examples - potatoes, spinach, bananas, beans, avocadoes, oranges, nuts.

## 2022-01-19 NOTE — ED Provider Notes (Signed)
[facility] Provider Note    Event Date/Time   First MD Initiated Contact with Patient 01/19/22 1041     (approximate)   History   Shortness of Breath   HPI  Andres [name] is a 56 y.o. male who presents to the emergency department shortness of breath.  Patient had a recent diagnosis of heart failure.  After starting Lasix had significantly low drops of his potassium so he was treated for his hypokalemia and told to hold his Lasix.  Has not taken his Lasix in the past 1 week and is now developed shortness of breath and worsening swelling to his legs.  Has a follow-up appointment with outpatient heart failure.  Denies any chest pain or nausea and vomiting.     Physical Exam   Triage Vital Signs: ED Triage Vitals  Enc Vitals Group     BP 01/19/22 0636 112/64     Pulse Rate 01/19/22 0636 (!) 54     Resp 01/19/22 1045 16     Temp 01/19/22 0636 97.7 F (36.5 C)     Temp Source 01/19/22 0636 Oral     SpO2 01/19/22 0636 95 %     Weight 01/19/22 0635 242 lb 15.2 oz (110.2 kg)     Height 01/19/22 0635 6\' 1"  (1.854 m)     Head Circumference --      Peak Flow --      Pain Score 01/19/22 0635 0     Pain Loc --      Pain Edu? --      Excl. in GC? --     Most recent vital signs: Vitals:   01/19/22 1330 01/19/22 1345  BP:    Pulse: (!) 53   Resp: (!) 23 20  Temp:    SpO2: 93%     Physical Exam Constitutional:      Appearance: He is well-developed.  HENT:     Head: Atraumatic.  Eyes:     Conjunctiva/sclera: Conjunctivae normal.  Cardiovascular:     Rate and Rhythm: Regular rhythm.  Pulmonary:     Effort: No respiratory distress.     Breath sounds: Examination of the right-lower field reveals rales. Examination of the left-lower field reveals rales. Rales present. No rhonchi.  Musculoskeletal:     Cervical back: Normal range of motion.     Right lower leg: Edema present.     Left lower leg: Edema present.  Skin:    General: Skin is warm.   Neurological:     Mental Status: He is alert. Mental status is at baseline.          IMPRESSION / MDM / ASSESSMENT AND PLAN / ED COURSE  I reviewed the triage vital signs and the nursing notes.  Differential diagnosis including heart failure exacerbation, ACS, pneumonia, anemia, DVT      EKG  My interpretation of the EKG -normal sinus rhythm.  Normal intervals.  No chamber enlargement.  No significant ST elevation or depression.  No signs of acute ischemia or dysrhythmia.  No significant change when compared to prior EKG  No tachycardic or bradycardic dysrhythmias while on cardiac telemetry.  RADIOLOGY I independently reviewed imaging, my interpretation of imaging: Chest x-ray with cardiomegaly and signs of pulmonary edema with pleural effusions.  Chest x-ray was read as moderate pulmonary edema.      ED Results / Procedures / Treatments   Labs (all labs ordered are listed, but only abnormal results are displayed)  Labs interpreted as -   Lab work with elevated BNP at 1200.  Mild leukocytosis.  No significant anemia.  Creatinine is at his baseline.  Potassium level is 3.5.  Labs Reviewed  BRAIN NATRIURETIC PEPTIDE - Abnormal; Notable for the following components:      Result Value   B Natriuretic Peptide 1,275.5 (*)    All other components within normal limits  CBC WITH DIFFERENTIAL/PLATELET - Abnormal; Notable for the following components:   WBC 12.5 (*)    Neutro Abs 9.4 (*)    All other components within normal limits  COMPREHENSIVE METABOLIC PANEL - Abnormal; Notable for the following components:   Glucose, Bld 122 (*)    BUN 21 (*)    Creatinine, Ser 1.31 (*)    Total Bilirubin 1.3 (*)    All other components within normal limits  URINALYSIS, ROUTINE W REFLEX MICROSCOPIC - Abnormal; Notable for the following components:   Color, Urine YELLOW (*)    APPearance CLEAR (*)    All other components within normal limits  TROPONIN I (HIGH SENSITIVITY)   TROPONIN I (HIGH SENSITIVITY)   Clinical picture is most concerning for acute on chronic heart failure exacerbation in the setting of not taking his Lasix.  We will restart the patient's Lasix and give a dose of IV Lasix in the emergency department.  Since restarting Lasix we will give the patient a dose of p.o. potassium.  Given 1 dose of IV magnesium.  Discussed close follow-up in heart failure clinic.  Given return precautions.  Discussed foods that were high in potassium and magnesium.  Given return precautions for any worsening symptoms.    PROCEDURES:  Critical Care performed: No  Procedures  Patient's presentation is most consistent with acute presentation with potential threat to life or bodily function.   MEDICATIONS ORDERED IN ED: Medications  furosemide (LASIX) injection 20 mg (20 mg Intravenous Given 01/19/22 1145)  potassium chloride SA (KLOR-CON M) CR tablet 40 mEq (40 mEq Oral Given 01/19/22 1144)  magnesium sulfate IVPB 1 g 100 mL (0 g Intravenous Stopped 01/19/22 1339)    FINAL CLINICAL IMPRESSION(S) / ED DIAGNOSES   Final diagnoses:  Acute on chronic systolic congestive heart failure (HCC)     Rx / DC Orders   ED Discharge Orders          Ordered    AMB referral to CHF clinic        01/19/22 1137             Note:  This document was prepared using Dragon voice recognition software and may include unintentional dictation errors.   Nathaniel Man, MD 01/19/22 1614

## 2022-01-19 NOTE — ED Triage Notes (Signed)
Presents to ER with c/o SOB that started about 2400.  Reports he awoke from sleep and hasn't been able to "catch his breath".   PCP told pt to stop diuretic this week after K+ was severely depleted and pt treated here in ER for replacement.

## 2022-01-20 ENCOUNTER — Ambulatory Visit: Payer: BC Managed Care – PPO | Admitting: Family

## 2022-01-20 ENCOUNTER — Emergency Department: Payer: BC Managed Care – PPO

## 2022-01-20 ENCOUNTER — Other Ambulatory Visit: Payer: Self-pay

## 2022-01-20 ENCOUNTER — Inpatient Hospital Stay
Admission: EM | Admit: 2022-01-20 | Discharge: 2022-01-22 | DRG: 291 | Disposition: A | Discharge status: Home / Self Care | Payer: BC Managed Care – PPO | Attending: Osteopathic Medicine | Admitting: Osteopathic Medicine

## 2022-01-20 ENCOUNTER — Encounter: Payer: Self-pay | Admitting: Intensive Care

## 2022-01-20 DIAGNOSIS — Z6833 Body mass index (BMI) 33.0-33.9, adult: Secondary | ICD-10-CM

## 2022-01-20 DIAGNOSIS — Z7982 Long term (current) use of aspirin: Secondary | ICD-10-CM

## 2022-01-20 DIAGNOSIS — R0602 Shortness of breath: Secondary | ICD-10-CM | POA: Diagnosis not present

## 2022-01-20 DIAGNOSIS — I4891 Unspecified atrial fibrillation: Secondary | ICD-10-CM

## 2022-01-20 DIAGNOSIS — I110 Hypertensive heart disease with heart failure: Principal | ICD-10-CM | POA: Diagnosis present

## 2022-01-20 DIAGNOSIS — I255 Ischemic cardiomyopathy: Secondary | ICD-10-CM | POA: Diagnosis present

## 2022-01-20 DIAGNOSIS — E669 Obesity, unspecified: Secondary | ICD-10-CM | POA: Diagnosis present

## 2022-01-20 DIAGNOSIS — R001 Bradycardia, unspecified: Secondary | ICD-10-CM | POA: Insufficient documentation

## 2022-01-20 DIAGNOSIS — Z87891 Personal history of nicotine dependence: Secondary | ICD-10-CM

## 2022-01-20 DIAGNOSIS — I509 Heart failure, unspecified: Secondary | ICD-10-CM | POA: Diagnosis not present

## 2022-01-20 DIAGNOSIS — Z79899 Other long term (current) drug therapy: Secondary | ICD-10-CM

## 2022-01-20 DIAGNOSIS — Z955 Presence of coronary angioplasty implant and graft: Secondary | ICD-10-CM

## 2022-01-20 DIAGNOSIS — L409 Psoriasis, unspecified: Secondary | ICD-10-CM | POA: Diagnosis present

## 2022-01-20 DIAGNOSIS — I5033 Acute on chronic diastolic (congestive) heart failure: Secondary | ICD-10-CM | POA: Diagnosis present

## 2022-01-20 DIAGNOSIS — I252 Old myocardial infarction: Secondary | ICD-10-CM

## 2022-01-20 DIAGNOSIS — E876 Hypokalemia: Secondary | ICD-10-CM | POA: Diagnosis present

## 2022-01-20 DIAGNOSIS — Z7901 Long term (current) use of anticoagulants: Secondary | ICD-10-CM

## 2022-01-20 DIAGNOSIS — Z20822 Contact with and (suspected) exposure to covid-19: Secondary | ICD-10-CM | POA: Diagnosis present

## 2022-01-20 DIAGNOSIS — E785 Hyperlipidemia, unspecified: Secondary | ICD-10-CM | POA: Diagnosis present

## 2022-01-20 DIAGNOSIS — Z91148 Patient's other noncompliance with medication regimen for other reason: Secondary | ICD-10-CM

## 2022-01-20 DIAGNOSIS — I480 Paroxysmal atrial fibrillation: Secondary | ICD-10-CM | POA: Diagnosis present

## 2022-01-20 DIAGNOSIS — I2510 Atherosclerotic heart disease of native coronary artery without angina pectoris: Secondary | ICD-10-CM | POA: Diagnosis present

## 2022-01-20 LAB — CBC WITH DIFFERENTIAL/PLATELET
Abs Immature Granulocytes: 0.05 10*3/uL (ref 0.00–0.07)
Basophils Absolute: 0.1 10*3/uL (ref 0.0–0.1)
Basophils Relative: 1 %
Eosinophils Absolute: 0.3 10*3/uL (ref 0.0–0.5)
Eosinophils Relative: 2 %
HCT: 39.3 % (ref 39.0–52.0)
Hemoglobin: 13.6 g/dL (ref 13.0–17.0)
Immature Granulocytes: 0 %
Lymphocytes Relative: 15 %
Lymphs Abs: 1.8 10*3/uL (ref 0.7–4.0)
MCH: 29.6 pg (ref 26.0–34.0)
MCHC: 34.6 g/dL (ref 30.0–36.0)
MCV: 85.4 fL (ref 80.0–100.0)
Monocytes Absolute: 0.9 10*3/uL (ref 0.1–1.0)
Monocytes Relative: 8 %
Neutro Abs: 9.1 10*3/uL — ABNORMAL HIGH (ref 1.7–7.7)
Neutrophils Relative %: 74 %
Platelets: 259 10*3/uL (ref 150–400)
RBC: 4.6 MIL/uL (ref 4.22–5.81)
RDW: 13.7 % (ref 11.5–15.5)
WBC: 12.1 10*3/uL — ABNORMAL HIGH (ref 4.0–10.5)
nRBC: 0 % (ref 0.0–0.2)

## 2022-01-20 LAB — COMPREHENSIVE METABOLIC PANEL
ALT: 23 U/L (ref 0–44)
AST: 17 U/L (ref 15–41)
Albumin: 3.7 g/dL (ref 3.5–5.0)
Alkaline Phosphatase: 64 U/L (ref 38–126)
Anion gap: 11 (ref 5–15)
BUN: 20 mg/dL (ref 6–20)
CO2: 24 mmol/L (ref 22–32)
Calcium: 9.1 mg/dL (ref 8.9–10.3)
Chloride: 104 mmol/L (ref 98–111)
Creatinine, Ser: 1.25 mg/dL — ABNORMAL HIGH (ref 0.61–1.24)
GFR, Estimated: >60 mL/min (ref >60)
Glucose, Bld: 117 mg/dL — ABNORMAL HIGH (ref 70–99)
Potassium: 3.8 mmol/L (ref 3.5–5.1)
Sodium: 139 mmol/L (ref 135–145)
Total Bilirubin: 1.4 mg/dL — ABNORMAL HIGH (ref 0.3–1.2)
Total Protein: 7.1 g/dL (ref 6.5–8.1)

## 2022-01-20 LAB — CBC
HCT: 40.5 % (ref 39.0–52.0)
Hemoglobin: 14.1 g/dL (ref 13.0–17.0)
MCH: 29.6 pg (ref 26.0–34.0)
MCHC: 34.8 g/dL (ref 30.0–36.0)
MCV: 85.1 fL (ref 80.0–100.0)
Platelets: 268 10*3/uL (ref 150–400)
RBC: 4.76 MIL/uL (ref 4.22–5.81)
RDW: 13.6 % (ref 11.5–15.5)
WBC: 11.2 10*3/uL — ABNORMAL HIGH (ref 4.0–10.5)
nRBC: 0 % (ref 0.0–0.2)

## 2022-01-20 LAB — BRAIN NATRIURETIC PEPTIDE: B Natriuretic Peptide: 937.3 pg/mL — ABNORMAL HIGH (ref 0.0–100.0)

## 2022-01-20 LAB — CREATININE, SERUM
Creatinine, Ser: 1.22 mg/dL (ref 0.61–1.24)
GFR, Estimated: >60 mL/min (ref >60)

## 2022-01-20 LAB — MAGNESIUM: Magnesium: 2.3 mg/dL (ref 1.7–2.4)

## 2022-01-20 LAB — TROPONIN I (HIGH SENSITIVITY): Troponin I (High Sensitivity): 10 ng/L (ref <18)

## 2022-01-20 MED ORDER — BISACODYL 5 MG PO TBEC: 5.0000 mg | DELAYED_RELEASE_TABLET | Freq: Every day | ORAL | Status: DC | PRN

## 2022-01-20 MED ORDER — NITROGLYCERIN 0.4 MG SL SUBL: 0.4000 mg | SUBLINGUAL_TABLET | SUBLINGUAL | Status: DC | PRN

## 2022-01-20 MED ORDER — POTASSIUM CHLORIDE CRYS ER 20 MEQ PO TBCR
40.0000 meq | EXTENDED_RELEASE_TABLET | Freq: Two times a day (BID) | ORAL | Status: DC
  Administered 2022-01-20 – 2022-01-22 (×4): 40 meq via ORAL
  Filled 2022-01-20 (×4): qty 2

## 2022-01-20 MED ORDER — ATORVASTATIN CALCIUM 20 MG PO TABS
40.0000 mg | ORAL_TABLET | Freq: Every evening | ORAL | Status: DC
  Administered 2022-01-20 – 2022-01-21 (×2): 40 mg via ORAL
  Filled 2022-01-20 (×2): qty 2

## 2022-01-20 MED ORDER — ACETAMINOPHEN 650 MG RE SUPP: 650.0000 mg | Freq: Four times a day (QID) | RECTAL | Status: DC | PRN

## 2022-01-20 MED ORDER — AMIODARONE HCL 200 MG PO TABS
200.0000 mg | ORAL_TABLET | Freq: Every day | ORAL | Status: DC
  Filled 2022-01-20: qty 1

## 2022-01-20 MED ORDER — AMIODARONE HCL 200 MG PO TABS: 200.0000 mg | ORAL_TABLET | Freq: Every day | ORAL | Status: DC

## 2022-01-20 MED ORDER — ACETAMINOPHEN 325 MG PO TABS: 650.0000 mg | ORAL_TABLET | Freq: Four times a day (QID) | ORAL | Status: DC | PRN

## 2022-01-20 MED ORDER — METOPROLOL TARTRATE 50 MG PO TABS
50.0000 mg | ORAL_TABLET | Freq: Two times a day (BID) | ORAL | Status: DC
  Administered 2022-01-20: 50 mg via ORAL
  Filled 2022-01-20 (×2): qty 1

## 2022-01-20 MED ORDER — POLYETHYLENE GLYCOL 3350 17 G PO PACK
17.0000 g | PACK | Freq: Every day | ORAL | Status: DC
  Administered 2022-01-22: 17 g via ORAL
  Filled 2022-01-20 (×2): qty 1

## 2022-01-20 MED ORDER — ENOXAPARIN SODIUM 60 MG/0.6ML IJ SOSY: 0.5000 mg/kg | PREFILLED_SYRINGE | INTRAMUSCULAR | Status: DC

## 2022-01-20 MED ORDER — ASPIRIN 81 MG PO TBEC
81.0000 mg | DELAYED_RELEASE_TABLET | Freq: Every day | ORAL | Status: DC
  Administered 2022-01-20 – 2022-01-22 (×3): 81 mg via ORAL
  Filled 2022-01-20 (×3): qty 1

## 2022-01-20 MED ORDER — AMIODARONE HCL 200 MG PO TABS: 200.0000 mg | ORAL_TABLET | Freq: Two times a day (BID) | ORAL | Status: DC

## 2022-01-20 MED ORDER — FUROSEMIDE 10 MG/ML IJ SOLN
80.0000 mg | Freq: Four times a day (QID) | INTRAMUSCULAR | Status: DC
  Administered 2022-01-20 – 2022-01-21 (×4): 80 mg via INTRAVENOUS
  Filled 2022-01-20 (×6): qty 8

## 2022-01-20 MED ORDER — METOPROLOL TARTRATE 75 MG PO TABS: 75.0000 mg | ORAL_TABLET | Freq: Two times a day (BID) | ORAL | Status: DC

## 2022-01-20 MED ORDER — APIXABAN 5 MG PO TABS
5.0000 mg | ORAL_TABLET | Freq: Two times a day (BID) | ORAL | Status: DC
  Administered 2022-01-20 – 2022-01-22 (×4): 5 mg via ORAL
  Filled 2022-01-20 (×4): qty 1

## 2022-01-20 MED ORDER — FUROSEMIDE 10 MG/ML IJ SOLN
80.0000 mg | Freq: Once | INTRAMUSCULAR | Status: AC
  Administered 2022-01-20: 80 mg via INTRAVENOUS
  Filled 2022-01-20: qty 8

## 2022-01-20 NOTE — H&P (Signed)
History and Physical    Andres [name] [id number] DOB: [birth year] DOA: 01/20/2022 PCP: [facility]  Chief Complaint: SOB Historian: patient  HPI:  Andres [name] is a 56 y.o. male with a PMH significant for HFpEF, CAD, prior tobacco use but not current smoker, history of MI, psoriasis, A-fib. At baseline, they live at home with their wife and perform all ADLs.  They work full-time in a [employer] as a Therapist, occupational.   Recent medical admissions summary: They have been hospitalized several times over the past couple of months for cardiac related conditions:  - He presented with A-fib RVR 10/13, treated medically with diltiazem and was discharged 10/14 in normal sinus rhythm on Eliquis, aspirin, metoprolol and amiodarone.  -He was again admitted 10/15 with acute respiratory failure with hypoxia in relation to CHF exacerbation. Underwent left heart catheterization on 10/16 which showed diffuse moderate nonobstructive coronary artery disease with patent stents previously placed. He was discharged on 10/18 in stable condition with increased metoprolol dose, Lasix 20 mg daily and amiodarone. -He was again admitted, most recently, on 10/20-10/23 for A-fib with RVR, he underwent cardioversion which resulted in sinus rhythm.  He was discharged on amiodarone, Eliquis, aspirin, atorvastatin 40, Lasix 20 mg, metoprolol 75 mg twice daily, 20 mEq potassium daily. -He was seen in the ED but not admitted on 10/26 as instructed by his primary care doctor who was monitoring his electrolytes and noticed severe hypokalemia.  His potassium 2.6 on presentation, was repleted and he was discharged home same day and told to hold his Lasix. -He was again seen in the ED on 10/29 for shortness of breath.  He was restarted on Lasix and told to follow-up outpatient with cardiology.  This presentation: They presented from home to the ED on 01/20/2022 with shortness of breath x1 days.  At  baseline, he has no shortness of breath with his every day activities. Starting yesterday he began having severe orthopnea and shortness of breath when walking short distances including from his bed to the bathroom.  Endorses perceived weight gain, lower extremity swelling.  Denies fever, cough, hemoptysis or sputum production. In the past, he has had good response to IV Lasix 80 mg with metolazone. He states that he has not smoked for the past month.  He currently denies any chest pain or shortness of breath while resting in his recliner.   In the ED, it was found that they had stable vital signs with the exception of mild bradycardia in the high 50s.  ECG showed sinus bradycardia. Significant findings included: Overall stable CBC, K+ 3.8, creatinine at baseline 1.25, troponin 10, magnesium 2.3. Chest x-ray showing diffuse pulmonary edema with small bilateral pleural effusions mostly unchanged since yesterday. On exam, patient appears calm without respiratory distress at rest.  Exam is mildly hypervolemic with 2+ pitting edema to mid shins bilaterally.  Lung sounds are clear bilaterally.  They were initially treated with 80 mg IV Lasix.  Patient states he has had 5-6 urine outputs since administration of that medication.   Patient was admitted to medicine service for further workup and management of CHF exacerbation as outlined in detail below.  Assessment/Plan Principal Problem:   Congestive heart failure (CHF) (HCC)   HFpEF-TTE revealed ejection fraction 55 to 60% on 01/13/2022. Will likely require long-term diuretics, could benefit from potassium sparing such as spironolactone but blood pressure is well controlled off medications. -With very complex recent cardiac history and high recidivism rate, will  be consulting cardiology to also follow and care for patient.   -Continue IV Lasix 80mg  x6hr -Daily weights -Strict I's/O -Monitor electrolytes and replete PRN - decreased home metoprolol  to 50mg  BID, sinus bradycardia  H.o Afib-recent cardioversion and is now in sinus bradycardia - f/u cardiology  - continue eliquis x4 weeks s/p conversion  CAD - continue ASA - nitro PRN - continue statin  Past Medical History:  Diagnosis Date   A-fib ([place])    CHF (congestive heart failure) ([place])    Coronary artery disease    History of kidney stones    Past Surgical History:  Procedure Laterality Date   CARDIOVERSION N/A 01/13/2022   Procedure: CARDIOVERSION;  Surgeon: Corey Skains, MD;  Location: [facility] ORS;  Service: Cardiovascular;  Laterality: N/A;   LEFT HEART CATH AND CORONARY ANGIOGRAPHY N/A 01/06/2022   Procedure: LEFT HEART CATH AND CORONARY ANGIOGRAPHY possible PCI and stent;  Surgeon: Isaias Cowman, MD;  Location: [place] [facility] [facility];  Service: Cardiovascular;  Laterality: N/A;   TEE WITHOUT CARDIOVERSION N/A 01/13/2022   Procedure: TRANSESOPHAGEAL ECHOCARDIOGRAM (TEE);  Surgeon: Corey Skains, MD;  Location: [facility] OR[facility];  Service: Cardiovascular;  Laterality: N/A;     reports that he quit smoking about 2 weeks ago. His smoking use included cigarettes. He smoked an average of 1 pack per day. He does not have any smokeless tobacco history on file. He reports that he does not currently use alcohol. He reports that he does not use drugs.  Allergies  Allergen Reactions   Statins Other (See Comments)    Myalgias, cannot remember which statin he took in the past but willing to try atorvastatin 12/2021    History reviewed. No pertinent family history.  Prior to Admission medications   Medication Sig Start Date End Date Taking? Authorizing Provider  amiodarone (PACERONE) 200 MG tablet Take 1 tablet (200 mg total) by mouth 2 (two) times daily. Until Friday, then start taking once daily 01/08/22 02/07/22  Lorella Nimrod, MD  apixaban (ELIQUIS) 5 MG TABS tablet Take 1 tablet (5 mg total) by mouth 2 (two) times daily. 01/04/22 02/03/22  Nolberto Hanlon, MD  aspirin  EC 81 MG tablet Take 1 tablet (81 mg total) by mouth daily. Swallow whole. 01/05/22   Nolberto Hanlon, MD  atorvastatin (LIPITOR) 40 MG tablet Take 1 tablet (40 mg total) by mouth every evening. 01/08/22   Lorella Nimrod, MD  Cyanocobalamin 1000 MCG TBCR Take 1 tablet by mouth daily. 02/06/20   [provider]  furosemide (LASIX) 20 MG tablet Take 1 tablet (20 mg total) by mouth daily. 01/09/22   Lorella Nimrod, MD  metoprolol tartrate 75 MG TABS Take 75 mg by mouth 2 (two) times daily. 01/08/22   Lorella Nimrod, MD  nitroGLYCERIN (NITROSTAT) 0.4 MG SL tablet 1 TABLET UNDER TONGUE AT ONSET OF CHEST PAIN. REPEAT IN 5 MIN IF NOT RESOLVED, MAX 3 DOSES. 911 IF NEEDED. 01/20/19   [provider]  potassium chloride SA (KLOR-CON M) 20 MEQ tablet Take 1 tablet (20 mEq total) by mouth daily. 01/13/22   Fritzi Mandes, MD   I have personally, briefly reviewed patient's prior medical records in [place]  Objective: Blood pressure 104/83, pulse (!) 51, resp. rate 19, height 6\' 1"  (1.854 m), weight 116.5 kg, SpO2 94 %.   Constitutional: NAD, calm, comfortable HEENT: lids and conjunctivae normal. MMM. Posterior pharynx clear of any exudate or lesions. Normal dentition.  Neck: normal, supple, no masses, no thyromegaly  Respiratory: CTAB, no wheezing, no crackles. Normal respiratory effort. No accessory muscle use.  Cardiovascular: RRR, no murmurs / rubs / gallops. 2+ pitting lower extremity edema to mid shin. 2+ pedal pulses. no clubbing / cyanosis.  Abdomen: soft, NT, ND, no masses or HSM palpated. Musculoskeletal: No joint deformity upper and lower extremities. Normal muscle tone.  Skin: dry, intact, normal color, normal temperature on exposed skin Neurologic: Alert and oriented x 3. Normal speech. Grossly non-focal exam. PERRL Psychiatric: Normal mood. Congruent affect. Talkative.  Labs on Admission: I have personally reviewed admission labs and imaging studies  CBC    Component  Value Date/Time   WBC 12.1 (H) 01/20/2022 0848   RBC 4.60 01/20/2022 0848   HGB 13.6 01/20/2022 0848   HGB 16.4 07/15/2013 0505   HCT 39.3 01/20/2022 0848   HCT 46.6 07/15/2013 0505   PLT 259 01/20/2022 0848   PLT 256 07/15/2013 0505   MCV 85.4 01/20/2022 0848   MCV 88 07/15/2013 0505   MCH 29.6 01/20/2022 0848   MCHC 34.6 01/20/2022 0848   RDW 13.7 01/20/2022 0848   RDW 13.0 07/15/2013 0505   LYMPHSABS 1.8 01/20/2022 0848   LYMPHSABS 1.8 10/16/2011 0147   MONOABS 0.9 01/20/2022 0848   MONOABS 0.8 10/16/2011 0147   EOSABS 0.3 01/20/2022 0848   EOSABS 0.1 10/16/2011 0147   BASOSABS 0.1 01/20/2022 0848   BASOSABS 0.1 10/16/2011 0147   CMP     Component Value Date/Time   NA 139 01/20/2022 0848   NA 137 07/15/2013 0505   K 3.8 01/20/2022 0848   K 4.3 07/15/2013 0505   CL 104 01/20/2022 0848   CL 105 07/15/2013 0505   CO2 24 01/20/2022 0848   CO2 22 07/15/2013 0505   GLUCOSE 117 (H) 01/20/2022 0848   GLUCOSE 122 (H) 07/15/2013 0505   BUN 20 01/20/2022 0848   BUN 20 (H) 07/15/2013 0505   CREATININE 1.25 (H) 01/20/2022 0848   CREATININE 0.77 07/15/2013 0505   CALCIUM 9.1 01/20/2022 0848   CALCIUM 8.4 (L) 07/15/2013 0505   PROT 7.1 01/20/2022 0848   PROT 5.0 (L) 07/15/2013 0535   ALBUMIN 3.7 01/20/2022 0848   ALBUMIN 2.6 (L) 07/15/2013 0535   AST 17 01/20/2022 0848   AST 19 07/15/2013 0535   ALT 23 01/20/2022 0848   ALT 26 07/15/2013 0535   ALKPHOS 64 01/20/2022 0848   ALKPHOS 60 07/15/2013 0535   BILITOT 1.4 (H) 01/20/2022 0848   BILITOT 0.2 07/15/2013 0535   GFRNONAA >60 01/20/2022 0848   GFRNONAA >60 07/15/2013 0505   GFRAA >60 04/19/2019 0926   GFRAA >60 07/15/2013 0505    Radiological Exams on Admission: DG Chest 2 View  Result Date: 01/20/2022 CLINICAL DATA:  Shortness of breath EXAM: CHEST - 2 VIEW COMPARISON:  Chest x-ray dated October 29th 2023 FINDINGS: Cardiac and mediastinal contours are within normal limits. Diffuse interstitial opacities. Small  bilateral pleural effusions. No evidence of pneumothorax. IMPRESSION: Unchanged pulmonary edema and small bilateral pleural effusions. Electronically Signed   By: Allegra Lai M.D.   On: 01/20/2022 09:21    EKG: Independently reviewed. Sinus bradycardia  DVT prophylaxis: enoxaparin (LOVENOX) injection 40 mg Start: 01/20/22 1130   Code Status: full  Family Communication: none at bedside  Disposition Plan: home  Consults called: cardiology   Leeroy Bock, DO [facility]  01/20/2022, 11:27 AM    To contact the appropriate [facility] Attending or Consulting provider: Check [email or web address] for coverage from 7pm-7am

## 2022-01-20 NOTE — ED Triage Notes (Addendum)
Patient c/o sob. Was seen yesterday and given lasix and magnesium and discharged home. Reports he felt fine when he left and around 11pm started feeling sob again.  Also c/o slight discomfort in abdomen

## 2022-01-20 NOTE — Consult Note (Signed)
CARDIOLOGY CONSULT NOTE               Patient ID: Andres [name] MRN: [id number] DOB/AGE: [birth year] 56 y.o.  Admit date: 01/20/2022 Referring Physician Dr Jamelle Rushing Primary Physician [facility] primary care Primary Cardiologist  Reason for Consultation shortness of breath heart failure  HPI: Patient is a 56 year old male recently presented with atrial fibrillation congestive heart failure tobacco abuse known coronary disease previous myocardial infarction with PCI and stent patient has been having recurrent episodes of atrial fibrillation heart failure diastolic dysfunction he also had an episode of hypokalemia since diuresis was discontinued patient now represents with heart failure symptoms she had a cath which showed no obstructive coronary disease guided cardioversion for his atrial fibrillation patient was on amiodarone Lasix metoprolol but because of hypokalemia his Lasix was discontinued patient now represents with shortness of breath dyspnea denies any significant chest pain  Review of systems complete and found to be negative unless listed above     Past Medical History:  Diagnosis Date   A-fib (HCC)    CHF (congestive heart failure) (HCC)    Coronary artery disease    History of kidney stones     Past Surgical History:  Procedure Laterality Date   CARDIOVERSION N/A 01/13/2022   Procedure: CARDIOVERSION;  Surgeon: Lamar Blinks, MD;  Location: [facility] ORS;  Service: Cardiovascular;  Laterality: N/A;   LEFT HEART CATH AND CORONARY ANGIOGRAPHY N/A 01/06/2022   Procedure: LEFT HEART CATH AND CORONARY ANGIOGRAPHY possible PCI and stent;  Surgeon: Marcina Millard, MD;  Location: [facility] INVASIVE CV LAB;  Service: Cardiovascular;  Laterality: N/A;   TEE WITHOUT CARDIOVERSION N/A 01/13/2022   Procedure: TRANSESOPHAGEAL ECHOCARDIOGRAM (TEE);  Surgeon: Lamar Blinks, MD;  Location: [facility] ORS;  Service: Cardiovascular;  Laterality: N/A;    (Not in a hospital  admission)  Social History   Socioeconomic History   Marital status: Married    Spouse name: Not on file   Number of children: Not on file   Years of education: Not on file   Highest education level: Not on file  Occupational History   Not on file  Tobacco Use   Smoking status: Former    Packs/day: 1.00    Types: Cigarettes    Quit date: 01/04/2022    Years since quitting: 0.0   Smokeless tobacco: Not on file  Vaping Use   Vaping Use: Never used  Substance and Sexual Activity   Alcohol use: Not Currently   Drug use: Never   Sexual activity: Not on file  Other Topics Concern   Not on file  Social History Narrative   Not on file   Social Determinants of Health   Financial Resource Strain: Not on file  Food Insecurity: No Food Insecurity (01/10/2022)   Hunger Vital Sign    Worried About Running Out of Food in the Last Year: Never true    Ran Out of Food in the Last Year: Never true  Transportation Needs: No Transportation Needs (01/10/2022)   PRAPARE - Administrator, Civil Service (Medical): No    Lack of Transportation (Non-Medical): No  Physical Activity: Not on file  Stress: Not on file  Social Connections: Not on file  Intimate Partner Violence: Not At Risk (01/10/2022)   Humiliation, Afraid, Rape, and Kick questionnaire    Fear of Current or Ex-Partner: No    Emotionally Abused: No    Physically Abused: No    Sexually Abused: No  History reviewed. No pertinent family history.    Review of systems complete and found to be negative unless listed above      PHYSICAL EXAM  General: Well developed, well nourished, in no acute distress HEENT:  Normocephalic and atramatic Neck:  No JVD.  Lungs: Clear bilaterally to auscultation and percussion. Heart: HRRR . Normal S1 and S2 without gallops or murmurs.  Abdomen: Bowel sounds are positive, abdomen soft and non-tender  Msk:  Back normal, normal gait. Normal strength and tone for  age. Extremities: No clubbing, cyanosis or edema.   Neuro: Alert and oriented X 3. Psych:  Good affect, responds appropriately  Labs:   Lab Results  Component Value Date   WBC 11.2 (H) 01/20/2022   HGB 14.1 01/20/2022   HCT 40.5 01/20/2022   MCV 85.1 01/20/2022   PLT 268 01/20/2022    Recent Labs  Lab 01/20/22 0848 01/20/22 1300  NA 139  --   K 3.8  --   CL 104  --   CO2 24  --   BUN 20  --   CREATININE 1.25* 1.22  CALCIUM 9.1  --   PROT 7.1  --   BILITOT 1.4*  --   ALKPHOS 64  --   ALT 23  --   AST 17  --   GLUCOSE 117*  --    Lab Results  Component Value Date   CKTOTAL 1,148 (H) 10/16/2011   CKMB 37.0 (H) 10/16/2011   TROPONINI < 0.02 07/15/2013    Lab Results  Component Value Date   CHOL 208 (H) 10/16/2011   CHOL 192 06/16/2011   Lab Results  Component Value Date   HDL 29 (L) 10/16/2011   HDL 23 (L) 06/16/2011   Lab Results  Component Value Date   LDLCALC 159 (H) 10/16/2011   LDLCALC 136 (H) 06/16/2011   Lab Results  Component Value Date   TRIG 100 10/16/2011   TRIG 163 06/16/2011   No results found for: "CHOLHDL" No results found for: "LDLDIRECT"    Radiology: DG Chest 2 View  Result Date: 01/20/2022 CLINICAL DATA:  Shortness of breath EXAM: CHEST - 2 VIEW COMPARISON:  Chest x-ray dated October 29th 2023 FINDINGS: Cardiac and mediastinal contours are within normal limits. Diffuse interstitial opacities. Small bilateral pleural effusions. No evidence of pneumothorax. IMPRESSION: Unchanged pulmonary edema and small bilateral pleural effusions. Electronically Signed   By: Allegra Lai M.D.   On: 01/20/2022 09:21   DG Chest Portable 1 View  Result Date: 01/19/2022 CLINICAL DATA:  Shortness of breath EXAM: PORTABLE CHEST 1 VIEW COMPARISON:  Recent prior chest x-ray 01/10/2022 FINDINGS: Diffuse interstitial prominence bilaterally most significant in the perihilar areas. Kerley B-lines present in the periphery. Findings are most consistent with  interstitial pulmonary edema. Findings are not quite as severe as seen on the twentieth. Small bilateral pleural effusions are present. No pneumothorax. No acute osseous abnormality. IMPRESSION: Moderate pulmonary edema with small bilateral layering pleural effusions. Electronically Signed   By: Malachy Moan M.D.   On: 01/19/2022 07:25   ECHO TEE  Result Date: 01/13/2022    TRANSESOPHOGEAL ECHO REPORT   Patient Name:   Andres [name] Date of Exam: 01/13/2022 Medical Rec #:  [id number]       Height:       73.0 in Accession #:    [id number]      Weight:       243.0 lb Date of Birth:  11-12-1965  BSA:          2.337 m Patient Age:    10 years        BP:           Not listed in chart/Not listed in                                               chart mmHg Patient Gender: M               HR:           53 bpm. Exam Location:  [facility] Procedure: Transesophageal Echo, Cardiac Doppler and Color Doppler Indications:     Not listed on TEE check-in sheet  History:         Patient has prior history of Echocardiogram examinations, most                  recent 01/07/2022. CHF, CAD; Arrythmias:Atrial Fibrillation.  Sonographer:     Sherrie Sport Referring Phys:  [place] Diagnosing Phys: Serafina Royals MD PROCEDURE: The transesophogeal probe was passed without difficulty through the esophogus of the patient. Sedation performed by performing physician. The patient developed no complications during the procedure. A successful direct current cardioversion was performed at 120 joules with 1 attempt. IMPRESSIONS  1. Left ventricular ejection fraction, by estimation, is 55 to 60%. The left ventricle has normal function.  2. Right ventricular systolic function is normal. The right ventricular size is normal.  3. Left atrial size was moderately dilated. No left atrial/left atrial appendage thrombus was detected.  4. The mitral valve is normal in structure. Moderate mitral valve regurgitation.  5. The aortic valve is  tricuspid. Aortic valve regurgitation is trivial.  6. There is Moderate (Grade III) plaque.  7. Cannot exclude a small PFO. Agitated saline contrast bubble study was positive with shunting observed within 3-6 cardiac cycles suggestive of interatrial shunt. FINDINGS  Left Ventricle: Left ventricular ejection fraction, by estimation, is 55 to 60%. The left ventricle has normal function. The left ventricular internal cavity size was normal in size. Right Ventricle: The right ventricular size is normal. No increase in right ventricular wall thickness. Right ventricular systolic function is normal. Left Atrium: Left atrial size was moderately dilated. Spontaneous echo contrast was present. No left atrial/left atrial appendage thrombus was detected. Right Atrium: Right atrial size was normal in size. Pericardium: There is no evidence of pericardial effusion. Mitral Valve: The mitral valve is normal in structure. Moderate mitral valve regurgitation. Tricuspid Valve: The tricuspid valve is normal in structure. Tricuspid valve regurgitation is trivial. Aortic Valve: The aortic valve is tricuspid. Aortic valve regurgitation is trivial. Pulmonic Valve: The pulmonic valve was normal in structure. Pulmonic valve regurgitation is trivial. Aorta: The aortic root and ascending aorta are structurally normal, with no evidence of dilitation. There is moderate (Grade III) plaque. IAS/Shunts: Cannot exclude a small PFO. Agitated saline contrast was given intravenously to evaluate for intracardiac shunting. Agitated saline contrast bubble study was positive with shunting observed within 3-6 cardiac cycles suggestive of interatrial shunt. Serafina Royals MD Electronically signed by Serafina Royals MD Signature Date/Time: 01/13/2022/12:43:51 PM    Final    DG Chest Port 1 View  Result Date: 01/10/2022 CLINICAL DATA:  Shortness breath EXAM: PORTABLE CHEST 1 VIEW COMPARISON:  Chest radiograph 01/09/2022 FINDINGS: No pleural effusion. No  pneumothorax. Unchanged cardiac and mediastinal contours. Redemonstrated diffuse interstitial opacities, slightly increased in the bilateral upper lobes compared to prior exam. No displaced rib fractures. Visualized upper abdomen is unremarkable. IMPRESSION: Redemonstrated diffuse interstitial opacities, slightly increased in the bilateral upper lobes compared to prior exam. Findings are favored to represent pulmonary edema, but atypical infection is also a differential consideration Electronically Signed   By: Lorenza Cambridge M.D.   On: 01/10/2022 08:30   DG Chest 2 View  Result Date: 01/10/2022 CLINICAL DATA:  Chest pain EXAM: CHEST - 2 VIEW COMPARISON:  01/05/2022 FINDINGS: Heart is normal size. Peribronchial thickening. Diffuse interstitial prominence throughout the lungs. No visible significant effusions. No acute bony abnormality. IMPRESSION: Peribronchial thickening with diffuse interstitial prominence could reflect interstitial edema or atypical infection. Electronically Signed   By: Charlett Nose M.D.   On: 01/10/2022 00:09   ECHOCARDIOGRAM COMPLETE  Result Date: 01/07/2022    ECHOCARDIOGRAM REPORT   Patient Name:   Andres [name] Date of Exam: 01/07/2022 Medical Rec #:  [id number]       Height:       72.0 in Accession #:    [id number]      Weight:       252.0 lb Date of Birth:  1965-07-12       BSA:          2.350 m Patient Age:    56 years        BP:           124/87 mmHg Patient Gender: M               HR:           71 bpm. Exam Location:  [facility] Procedure: 2D Echo, Color Doppler, Cardiac Doppler and Intracardiac            Opacification Agent Indications:     I50.31 congestive heart failure-Acute Diastolic  History:         Patient has no prior history of Echocardiogram examinations.                  CAD; Arrythmias:Atrial Fibrillation.  Sonographer:     Humphrey Rolls Referring Phys:  [id number] AGBATA Diagnosing Phys: Arnoldo Hooker MD  Sonographer Comments: Suboptimal apical window. IMPRESSIONS   1. Left ventricular ejection fraction, by estimation, is 65 to 70%. The left ventricle has normal function. The left ventricle has no regional wall motion abnormalities. Left ventricular diastolic parameters were normal.  2. Right ventricular systolic function is normal. The right ventricular size is normal.  3. Left atrial size was mildly dilated.  4. The mitral valve is normal in structure. Moderate mitral valve regurgitation.  5. The aortic valve is normal in structure. Aortic valve regurgitation is trivial. FINDINGS  Left Ventricle: Left ventricular ejection fraction, by estimation, is 65 to 70%. The left ventricle has normal function. The left ventricle has no regional wall motion abnormalities. Definity contrast agent was given IV to delineate the left ventricular  endocardial borders. The left ventricular internal cavity size was normal in size. There is no left ventricular hypertrophy. Left ventricular diastolic parameters were normal. Right Ventricle: The right ventricular size is normal. No increase in right ventricular wall thickness. Right ventricular systolic function is normal. Left Atrium: Left atrial size was mildly dilated. Right Atrium: Right atrial size was normal in size. Pericardium: There is no evidence of pericardial effusion. Mitral Valve: The mitral valve is normal in structure. Moderate mitral valve regurgitation.  Tricuspid Valve: The tricuspid valve is normal in structure. Tricuspid valve regurgitation is mild. Aortic Valve: The aortic valve is normal in structure. Aortic valve regurgitation is trivial. Aortic valve mean gradient measures 3.0 mmHg. Aortic valve peak gradient measures 5.9 mmHg. Aortic valve area, by VTI measures 2.55 cm. Pulmonic Valve: The pulmonic valve was normal in structure. Pulmonic valve regurgitation is trivial. Aorta: The aortic root and ascending aorta are structurally normal, with no evidence of dilitation. IAS/Shunts: No atrial level shunt detected by color  flow Doppler.  LEFT VENTRICLE PLAX 2D LVIDd:         5.10 cm LVIDs:         2.70 cm LV PW:         1.10 cm LV IVS:        1.20 cm LVOT diam:     2.30 cm LV SV:         52 LV SV Index:   22 LVOT Area:     4.15 cm  RIGHT VENTRICLE RV Basal diam:  4.00 cm LEFT ATRIUM             Index        RIGHT ATRIUM           Index LA diam:        5.10 cm 2.17 cm/m   RA Area:     19.90 cm LA Vol (A2C):   49.8 ml 21.19 ml/m  RA Volume:   53.90 ml  22.93 ml/m LA Vol (A4C):   58.7 ml 24.97 ml/m LA Biplane Vol: 54.7 ml 23.27 ml/m  AORTIC VALVE                    PULMONIC VALVE AV Area (Vmax):    2.75 cm     PV Vmax:          0.83 m/s AV Area (Vmean):   2.55 cm     PV Vmean:         57.700 cm/s AV Area (VTI):     2.55 cm     PV VTI:           0.138 m AV Vmax:           121.00 cm/s  PV Peak grad:     2.8 mmHg AV Vmean:          84.100 cm/s  PV Mean grad:     2.0 mmHg AV VTI:            0.202 m      PR End Diast Vel: 13.25 msec AV Peak Grad:      5.9 mmHg AV Mean Grad:      3.0 mmHg LVOT Vmax:         80.00 cm/s LVOT Vmean:        51.700 cm/s LVOT VTI:          0.124 m LVOT/AV VTI ratio: 0.61  AORTA Ao Root diam: 3.10 cm MITRAL VALVE MV Area (PHT): 2.86 cm     SHUNTS MV Decel Time: 265 msec     Systemic VTI:  0.12 m MV E velocity: 176.00 cm/s  Systemic Diam: 2.30 cm Arnoldo HookerBruce Kowalski MD Electronically signed by Arnoldo HookerBruce Kowalski MD Signature Date/Time: 01/07/2022/12:21:08 PM    Final    CARDIAC CATHETERIZATION  Result Date: 01/06/2022   Prox RCA lesion is 20% stenosed.   Mid RCA lesion is 20% stenosed.   Dist RCA lesion is 40% stenosed.  RPAV lesion is 40% stenosed.   Prox Cx lesion is 50% stenosed.   2nd Mrg lesion is 50% stenosed.   1st Mrg lesion is 30% stenosed.   Prox LAD lesion is 50% stenosed.   1st Diag lesion is 60% stenosed.   Previously placed Prox LAD to Mid LAD stent of unknown type is  widely patent.   The left ventricular systolic function is normal.   The left ventricular ejection fraction is 50-55% by visual  estimate. 1.  Diffuse moderate nonobstructive coronary artery disease with patent stent proximal/mid LAD, 50% stenosis proximal LAD, 50% stenosis proximal left circumflex, 40% distal RCA 2.  Normal left ventricular function with estimated LVEF 50-55% Recommendations 1.  Medical therapy 2.  Resume Eliquis 01/07/2022   CT Angio Chest PE W/Cm &/Or Wo Cm  Result Date: 01/05/2022 CLINICAL DATA:  56 year old male with suspected pulmonary embolism. EXAM: CT ANGIOGRAPHY CHEST WITH CONTRAST TECHNIQUE: Multidetector CT imaging of the chest was performed using the standard protocol during bolus administration of intravenous contrast. Multiplanar CT image reconstructions and MIPs were obtained to evaluate the vascular anatomy. RADIATION DOSE REDUCTION: This exam was performed according to the departmental dose-optimization program which includes automated exposure control, adjustment of the mA and/or kV according to patient size and/or use of iterative reconstruction technique. CONTRAST:  75mL OMNIPAQUE IOHEXOL 350 MG/ML SOLN COMPARISON:  No priors. FINDINGS: Cardiovascular: There are no filling defects within the pulmonary arterial tree to suggest pulmonary embolism. Heart size is mildly enlarged. There is no significant pericardial fluid, thickening or pericardial calcification. There is aortic atherosclerosis, as well as atherosclerosis of the great vessels of the mediastinum and the coronary arteries, including calcified atherosclerotic plaque in the left main, left anterior descending, left circumflex and right coronary arteries. Mediastinum/Nodes: Multiple prominent borderline enlarged and mildly enlarged mediastinal and hilar lymph nodes are noted, with the largest mediastinal lymph node measuring up to 2 cm in short axis in the subcarinal nodal station and 1.5 cm in the prevascular nodal station. Esophagus is unremarkable in appearance. No axillary lymphadenopathy. Lungs/Pleura: Diffuse ground-glass attenuation and  widespread interlobular septal thickening is noted throughout the lungs bilaterally, suggesting a background of interstitial pulmonary edema. No confluent consolidative airspace disease. No pleural effusions. No definite suspicious appearing pulmonary nodules or masses are noted. Mild centrilobular and paraseptal emphysema predominantly in the lung apices. Upper Abdomen: Aortic atherosclerosis. Musculoskeletal: Healing nondisplaced fracture of the lateral aspect of the left sixth rib. There are no aggressive appearing lytic or blastic lesions noted in the visualized portions of the skeleton. Review of the MIP images confirms the above findings. IMPRESSION: 1. Cardiomegaly with evidence of interstitial pulmonary edema; imaging findings suggesting underlying congestive heart failure. 2. Multiple prominent borderline and mildly enlarged mediastinal and bilateral hilar lymph nodes. This is nonspecific in the setting of congestive heart failure, but follow-up contrast enhanced chest CT is recommended in 1 month after resolution of the patient's acute illness to re-evaluate these findings and ensure regression. Lymphoproliferative disease or other malignancy is not excluded. 3. Aortic atherosclerosis, in addition to left main and three-vessel coronary artery disease. Please note that although the presence of coronary artery calcium documents the presence of coronary artery disease, the severity of this disease and any potential stenosis cannot be assessed on this non-gated CT examination. Assessment for potential risk factor modification, dietary therapy or pharmacologic therapy may be warranted, if clinically indicated. 4. Healing nondisplaced fracture of the lateral aspect of the left sixth rib. Aortic Atherosclerosis (ICD10-I70.0). Electronically  Signed   By: Trudie Reed M.D.   On: 01/05/2022 05:14   DG Chest 2 View  Result Date: 01/05/2022 CLINICAL DATA:  56 year old male with history of chest pain and  shortness of breath. EXAM: CHEST - 2 VIEW COMPARISON:  Chest x-ray 01/02/2022. FINDINGS: There is cephalization of the pulmonary vasculature and slight indistinctness of the interstitial markings suggestive of mild pulmonary edema. No definite pleural effusions. No pneumothorax. Heart size is mildly enlarged. Upper mediastinal contours are within normal limits. IMPRESSION: 1. The appearance of the chest is most suggestive of congestive heart failure, as above. Electronically Signed   By: Trudie Reed M.D.   On: 01/05/2022 05:00   DG Chest Port 1 View  Result Date: 01/03/2022 CLINICAL DATA:  Chest pain EXAM: PORTABLE CHEST 1 VIEW COMPARISON:  01/24/2020 FINDINGS: Lungs are clear.  No pleural effusion or pneumothorax. The heart is normal in size. IMPRESSION: No evidence of acute cardiopulmonary disease. Electronically Signed   By: Charline Bills M.D.   On: 01/03/2022 00:02    EKG: Sinus bradycardia nonspecific ST-T wave changes  ASSESSMENT AND PLAN:  Congestive heart failure diastolic Atrial fibrillation paroxysmal status post cardioversion Tobacco abuse Coronary artery disease Hypertension Psoriasis Shortness of breath History of hypokalemia . Plan plan Agree with admit follow-up EKGs troponin rule out myocardial infarction Recommend diuretic therapy with Lasix IV Consider switching to torsemide p.o. as an outpatient Agree with spironolactone to help with potassium sparing as well as diuresis Continue Eliquis post cardioversion Continue metoprolol therapy for heart failure and rate control for atrial fibrillation Consider discontinue amiodarone for dyspnea and bradycardia Consider having the patient referred to pulmonary for further evaluation of recurrent shortness of breath Continue statin therapy for lipid management Aspirin therapy for coronary artery disease   Signed: Alwyn Pea MD 01/20/2022, 5:02 PM

## 2022-01-20 NOTE — ED Provider Notes (Signed)
[facility] Emergency Department Provider Note  ____________________________________________   Event Date/Time   First MD Initiated Contact with Patient 01/20/22 1034     (approximate)  I have reviewed the triage vital signs and the nursing notes.   HISTORY  Chief Complaint Shortness of Breath    HPI Andres [name] is a 56 y.o. male with history of CHF on Lasix who comes in with concerns for increasing shortness of breath.  Patient was seen yesterday after holding his Lasix while his potassium was low for the past week he came back in for worsening swelling and shortness of breath.  When he was seen yesterday he had a chest x-ray showing pulmonary edema a BNP that was elevated to 1200.  Patient was given a dose of IV Lasix and a dose of IV magnesium in with plan was to follow-up with the heart failure clinic.  However when patient got home he reported increasing shortness of breath so came back to the emergency room.  Patient does have a history of atrial fibrillation as well which he is on Eliquis for          Past Medical History:  Diagnosis Date   A-fib [facility])    CHF (congestive heart failure) ([place])    Coronary artery disease    History of kidney stones     Patient Active Problem List   Diagnosis Date Noted   Atrial fibrillation with rapid ventricular response ([place]) 01/10/2022   Atrial fibrillation with RVR ([place]) 01/10/2022   Hypoxia    SOB (shortness of breath)    Dysuria 01/06/2022   CHF (congestive heart failure) ([place]) 01/05/2022   Elevated troponin 01/05/2022   Acute respiratory failure with hypoxia ([place]) 01/05/2022   Atrial fibrillation, persistent ([place]) 01/05/2022   Leukocytosis 01/05/2022   Psoriasis 01/03/2022   Tobacco use disorder 01/03/2022   Chest pain 01/03/2022   Acute bronchitis 01/03/2022   CAD S/P percutaneous coronary angioplasty 11/02/2014   Hx of myocardial infarction 08/13/2012   Ischemic cardiomyopathy 10/21/2011     Past Surgical History:  Procedure Laterality Date   CARDIOVERSION N/A 01/13/2022   Procedure: CARDIOVERSION;  Surgeon: Corey Skains, MD;  Location: [facility] ORS;  Service: Cardiovascular;  Laterality: N/A;   LEFT HEART CATH AND CORONARY ANGIOGRAPHY N/A 01/06/2022   Procedure: LEFT HEART CATH AND CORONARY ANGIOGRAPHY possible PCI and stent;  Surgeon: Isaias Cowman, MD;  Location: [place] CV LAB;  Service: Cardiovascular;  Laterality: N/A;   TEE WITHOUT CARDIOVERSION N/A 01/13/2022   Procedure: TRANSESOPHAGEAL ECHOCARDIOGRAM (TEE);  Surgeon: Corey Skains, MD;  Location: [facility] ORS;  Service: Cardiovascular;  Laterality: N/A;    Prior to Admission medications   Medication Sig Start Date End Date Taking? Authorizing Provider  amiodarone (PACERONE) 200 MG tablet Take 1 tablet (200 mg total) by mouth 2 (two) times daily. Until Friday, then start taking once daily 01/08/22 02/07/22  Lorella Nimrod, MD  apixaban (ELIQUIS) 5 MG TABS tablet Take 1 tablet (5 mg total) by mouth 2 (two) times daily. 01/04/22 02/03/22  Nolberto Hanlon, MD  aspirin EC 81 MG tablet Take 1 tablet (81 mg total) by mouth daily. Swallow whole. 01/05/22   Nolberto Hanlon, MD  atorvastatin (LIPITOR) 40 MG tablet Take 1 tablet (40 mg total) by mouth every evening. 01/08/22   Lorella Nimrod, MD  Cyanocobalamin 1000 MCG TBCR Take 1 tablet by mouth daily. 02/06/20   [provider]  furosemide (LASIX) 20 MG tablet Take 1 tablet (  20 mg total) by mouth daily. 01/09/22   Arnetha Courser, MD  metoprolol tartrate 75 MG TABS Take 75 mg by mouth 2 (two) times daily. 01/08/22   Arnetha Courser, MD  nitroGLYCERIN (NITROSTAT) 0.4 MG SL tablet 1 TABLET UNDER TONGUE AT ONSET OF CHEST PAIN. REPEAT IN 5 MIN IF NOT RESOLVED, MAX 3 DOSES. 911 IF NEEDED. 01/20/19   [provider]  potassium chloride SA (KLOR-CON M) 20 MEQ tablet Take 1 tablet (20 mEq total) by mouth daily. 01/13/22   Enedina Finner, MD     Allergies Statins  History reviewed. No pertinent family history.  Social History Social History   Tobacco Use   Smoking status: Former    Packs/day: 1.00    Types: Cigarettes    Quit date: 01/04/2022    Years since quitting: 0.0  Vaping Use   Vaping Use: Never used  Substance Use Topics   Alcohol use: Not Currently   Drug use: Never      Review of Systems Constitutional: No fever/chills Eyes: No visual changes. ENT: No sore throat. Cardiovascular: No chest pain Respiratory: Positive for SOB Gastrointestinal: No abdominal pain.  No nausea, no vomiting.  No diarrhea.  No constipation. Genitourinary: Negative for dysuria. Musculoskeletal: Negative for back pain. Skin: Negative for rash. Neurological: Negative for headaches, focal weakness or numbness. All other ROS negative ____________________________________________   PHYSICAL EXAM:  VITAL SIGNS: ED Triage Vitals [01/20/22 0830]  Enc Vitals Group     BP      Pulse      Resp      Temp      Temp src      SpO2      Weight 256 lb 14.4 oz (116.5 kg)     Height 6\' 1"  (1.854 m)     Head Circumference      Peak Flow      Pain Score 5     Pain Loc      Pain Edu?      Excl. in GC?     Constitutional: Alert and oriented. Well appearing and in no acute distress. Eyes: Conjunctivae are normal. EOMI. Head: Atraumatic. Nose: No congestion/rhinnorhea. Mouth/Throat: Mucous membranes are moist.   Neck: No stridor. Trachea Midline. FROM Cardiovascular: Normal rate, regular rhythm. Grossly normal heart sounds.  Good peripheral circulation. Respiratory:  Gastrointestinal: Soft and nontender. No distention. No abdominal bruits.  Musculoskeletal: No lower extremity tenderness nor edema.  No joint effusions. Neurologic:  Normal speech and language. No gross focal neurologic deficits are appreciated.  Skin:  Skin is warm, dry and intact. No rash noted. Psychiatric: Mood and affect are normal. Speech and behavior  are normal. GU: Deferred   ____________________________________________   LABS (all labs ordered are listed, but only abnormal results are displayed)  Labs Reviewed  CBC WITH DIFFERENTIAL/PLATELET - Abnormal; Notable for the following components:      Result Value   WBC 12.1 (*)    Neutro Abs 9.1 (*)    All other components within normal limits  COMPREHENSIVE METABOLIC PANEL - Abnormal; Notable for the following components:   Glucose, Bld 117 (*)    Creatinine, Ser 1.25 (*)    Total Bilirubin 1.4 (*)    All other components within normal limits   ____________________________________________   ED ECG REPORT I, , the attending physician, personally viewed and interpreted this ECG.  Sinus bradycardia rate of 59 without any ST elevation or T wave inversions, QTc 41 ____________________________________________  RADIOLOGY I, Concha Se, personally viewed and evaluated these images (plain radiographs) as part of my medical decision making, as well as reviewing the written report by the radiologist.  ED MD interpretation: Reviewed the x-ray personally and patient has bilateral pleural effusions and edema  Official radiology report(s): DG Chest 2 View  Result Date: 01/20/2022 CLINICAL DATA:  Shortness of breath EXAM: CHEST - 2 VIEW COMPARISON:  Chest x-ray dated October 29th 2023 FINDINGS: Cardiac and mediastinal contours are within normal limits. Diffuse interstitial opacities. Small bilateral pleural effusions. No evidence of pneumothorax. IMPRESSION: Unchanged pulmonary edema and small bilateral pleural effusions. Electronically Signed   By: Allegra Lai M.D.   On: 01/20/2022 09:21    ____________________________________________   PROCEDURES  Procedure(s) performed (including Critical Care):  Procedures   ____________________________________________   INITIAL IMPRESSION / ASSESSMENT AND PLAN / ED COURSE   Andres [name] was evaluated in Emergency  Department on 01/20/2022 for the symptoms described in the history of present illness. He was evaluated in the context of the global COVID-19 pandemic, which necessitated consideration that the patient might be at risk for infection with the SARS-CoV-2 virus that causes COVID-19. Institutional protocols and algorithms that pertain to the evaluation of patients at risk for COVID-19 are in a state of rapid change based on information released by regulatory bodies including the CDC and federal and state organizations. These policies and algorithms were followed during the patient's care in the ED.    This is most concerning for an acute exacerbation of his chronic CHF.  Pt presents with SOB. Differential includes: PNA-will get xray to evaluation Anemia-CBC to evaluate ACS- will get trops Arrhythmia-Will get EKG and keep on monitor.  COVID- will get testing per algorithm. PE-lower suspicion given no risk factors and other cause more likely   CMP shows stable creatinine.  Normal potassium.  CBC is slightly elevated white count downtrending from 4 days ago  I reviewed patient's prior hospital summary where he is required 80 of IV Lasix and occasionally had to have metolazone.  We will start out with the 80 of IV Lasix suspect that he is just not getting good urine output.  Patient is pretty dyspneic with walking sats are 91 to 92%.  Patient does not feel comfortable going home after failing outpatient trial yesterday therefore will discuss with hospital team for admission       Patient's CMP is reassuring with potassium that is normal creatinine that is around baseline       ____________________________________________   FINAL CLINICAL IMPRESSION(S) / ED DIAGNOSES   Final diagnoses:  Acute on chronic congestive heart failure, unspecified heart failure type (HCC)     MEDICATIONS GIVEN DURING THIS VISIT:  Medications  furosemide (LASIX) injection 80 mg (has no administration in time  range)     ED Discharge Orders     None        Note:  This document was prepared using Dragon voice recognition software and may include unintentional dictation errors.   Concha Se, MD 01/20/22 1116

## 2022-01-21 ENCOUNTER — Encounter: Payer: Self-pay | Admitting: Student in an Organized Health Care Education/Training Program

## 2022-01-21 DIAGNOSIS — Z7901 Long term (current) use of anticoagulants: Secondary | ICD-10-CM | POA: Diagnosis not present

## 2022-01-21 DIAGNOSIS — Z79899 Other long term (current) drug therapy: Secondary | ICD-10-CM | POA: Diagnosis not present

## 2022-01-21 DIAGNOSIS — I5033 Acute on chronic diastolic (congestive) heart failure: Secondary | ICD-10-CM | POA: Diagnosis present

## 2022-01-21 DIAGNOSIS — Z955 Presence of coronary angioplasty implant and graft: Secondary | ICD-10-CM | POA: Diagnosis not present

## 2022-01-21 DIAGNOSIS — I2510 Atherosclerotic heart disease of native coronary artery without angina pectoris: Secondary | ICD-10-CM | POA: Insufficient documentation

## 2022-01-21 DIAGNOSIS — I509 Heart failure, unspecified: Secondary | ICD-10-CM | POA: Diagnosis not present

## 2022-01-21 DIAGNOSIS — I10 Essential (primary) hypertension: Secondary | ICD-10-CM | POA: Diagnosis not present

## 2022-01-21 DIAGNOSIS — Z7982 Long term (current) use of aspirin: Secondary | ICD-10-CM | POA: Diagnosis not present

## 2022-01-21 DIAGNOSIS — I4891 Unspecified atrial fibrillation: Secondary | ICD-10-CM | POA: Diagnosis not present

## 2022-01-21 DIAGNOSIS — Z20822 Contact with and (suspected) exposure to covid-19: Secondary | ICD-10-CM | POA: Diagnosis present

## 2022-01-21 DIAGNOSIS — R0602 Shortness of breath: Secondary | ICD-10-CM | POA: Diagnosis present

## 2022-01-21 DIAGNOSIS — Z6833 Body mass index (BMI) 33.0-33.9, adult: Secondary | ICD-10-CM | POA: Diagnosis not present

## 2022-01-21 DIAGNOSIS — E785 Hyperlipidemia, unspecified: Secondary | ICD-10-CM | POA: Diagnosis present

## 2022-01-21 DIAGNOSIS — L409 Psoriasis, unspecified: Secondary | ICD-10-CM | POA: Diagnosis present

## 2022-01-21 DIAGNOSIS — I255 Ischemic cardiomyopathy: Secondary | ICD-10-CM | POA: Diagnosis present

## 2022-01-21 DIAGNOSIS — I252 Old myocardial infarction: Secondary | ICD-10-CM | POA: Diagnosis not present

## 2022-01-21 DIAGNOSIS — Z91148 Patient's other noncompliance with medication regimen for other reason: Secondary | ICD-10-CM | POA: Diagnosis not present

## 2022-01-21 DIAGNOSIS — I110 Hypertensive heart disease with heart failure: Secondary | ICD-10-CM | POA: Diagnosis present

## 2022-01-21 DIAGNOSIS — Z87891 Personal history of nicotine dependence: Secondary | ICD-10-CM | POA: Diagnosis not present

## 2022-01-21 DIAGNOSIS — E876 Hypokalemia: Secondary | ICD-10-CM | POA: Diagnosis present

## 2022-01-21 DIAGNOSIS — E669 Obesity, unspecified: Secondary | ICD-10-CM | POA: Diagnosis present

## 2022-01-21 DIAGNOSIS — I5023 Acute on chronic systolic (congestive) heart failure: Secondary | ICD-10-CM | POA: Diagnosis not present

## 2022-01-21 DIAGNOSIS — I480 Paroxysmal atrial fibrillation: Secondary | ICD-10-CM | POA: Diagnosis present

## 2022-01-21 LAB — BASIC METABOLIC PANEL
Anion gap: 10 (ref 5–15)
BUN: 22 mg/dL — ABNORMAL HIGH (ref 6–20)
CO2: 26 mmol/L (ref 22–32)
Calcium: 8.6 mg/dL — ABNORMAL LOW (ref 8.9–10.3)
Chloride: 101 mmol/L (ref 98–111)
Creatinine, Ser: 1.42 mg/dL — ABNORMAL HIGH (ref 0.61–1.24)
GFR, Estimated: 58 mL/min — ABNORMAL LOW (ref >60)
Glucose, Bld: 180 mg/dL — ABNORMAL HIGH (ref 70–99)
Potassium: 3.3 mmol/L — ABNORMAL LOW (ref 3.5–5.1)
Sodium: 137 mmol/L (ref 135–145)

## 2022-01-21 MED ORDER — MELATONIN 5 MG PO TABS
2.5000 mg | ORAL_TABLET | Freq: Once | ORAL | Status: AC
  Administered 2022-01-21: 2.5 mg via ORAL
  Filled 2022-01-21: qty 1

## 2022-01-21 MED ORDER — AMIODARONE HCL 200 MG PO TABS
100.0000 mg | ORAL_TABLET | Freq: Every day | ORAL | Status: DC
  Filled 2022-01-21: qty 1

## 2022-01-21 MED ORDER — METOPROLOL TARTRATE 25 MG PO TABS
25.0000 mg | ORAL_TABLET | Freq: Two times a day (BID) | ORAL | Status: DC
  Administered 2022-01-21 – 2022-01-22 (×2): 25 mg via ORAL
  Filled 2022-01-21 (×2): qty 1

## 2022-01-21 MED ORDER — TORSEMIDE 20 MG PO TABS
40.0000 mg | ORAL_TABLET | Freq: Two times a day (BID) | ORAL | Status: DC
  Administered 2022-01-22: 40 mg via ORAL
  Filled 2022-01-21: qty 2

## 2022-01-21 MED ORDER — SPIRONOLACTONE 25 MG PO TABS
25.0000 mg | ORAL_TABLET | Freq: Every day | ORAL | Status: DC
  Administered 2022-01-21 – 2022-01-22 (×2): 25 mg via ORAL
  Filled 2022-01-21 (×2): qty 1

## 2022-01-21 NOTE — Progress Notes (Signed)
       CROSS COVER NOTE  NAME: Andres [name] MRN: [id number] DOB : Dec 05, 1965 ATTENDING PHYSICIAN: Richarda Osmond, MD    Date of Service   01/21/2022   HPI/Events of Note   Medication request received for sleep aid.  Interventions   Assessment/Plan:  Melatonin     This document was prepared using Dragon voice recognition software and may include unintentional dictation errors.  Neomia Glass DNP, MBA, FNP-BC Nurse Practitioner [facility] Pager [phone]

## 2022-01-21 NOTE — Consult Note (Signed)
   Heart Failure Nurse Navigator Note  HFpEF  65 to 70%.  Moderate mitral regurgitation.  He presented to the emergency room with complaints of worsening shortness of breath and having gained approximately 7 pounds.  Comorbidities:  Coronary artery disease with stenting to the LAD Atrial fibrillation GERD History of tobacco abuse  Medications:  Amiodarone 200 mg daily Apixaban 5 mg 2 times a day Aspirin 81 mg daily Atorvastatin 40 mg daily Furosemide 80 mg IV Metoprolol tartrate 75 mg 2 times a day Potassium chloride 20 mEq daily  Labs:  BNP 937, sodium 137 potassium 3.3, chloride 101, CO2 26, BUN 22, creatinine 1.42, GFR 58. Weight 116.5 kg Blood pressure 127/69   Initial meeting with patient on this admission.  He was sitting on the side of the gurney in the emergency room currently on room air in no acute distress.  He states after he had gone home after his cardioversion that he had felt great, he went out and mowed his 3 acres.  But then with having trouble with the lower potassium and being told to hold his Lasix he had gained 7 pounds.  States that it was the same routine where he wakes up in the middle the night having to go to the bathroom and being short of breath and then ends up spending the rest of the night in the recliner.  He states that his wife has told him that he snores when he sleeps.  Went over follow-up in the outpatient heart failure clinic, the importance of reporting a 2 to 3 pound weight gain overnight or total 5 pounds in the week or changes in symptoms to hopefully help keep him out of the hospital.  He has follow-up in the outpatient heart failure clinic on November 8 at 9 AM.  We will continue to follow along.  Pricilla Riffle RN [facility]

## 2022-01-21 NOTE — ED Notes (Signed)
Pt being admitted to 249. A&Ox4, VSS at this time. Report called to Gwenette Greet, Therapist, sports. Belongings sent with pt. Pt being transported by ED tech via wheelchair.

## 2022-01-21 NOTE — Progress Notes (Signed)
[facility] Cardiology    SUBJECTIVE: Patient feels much improved no shortness of breath no swelling no chest pain no palpitations or tachycardia patient feels well enough to be discharged home and like to return to work soon as possible   Vitals:   01/21/22 0757 01/21/22 1117 01/21/22 1328 01/21/22 1558  BP: 109/62 112/64 127/69 139/73  Pulse: (!) 50 (!) 50 (!) 52 (!) 57  Resp: 12 15 15 19   Temp: 98.1 F (36.7 C) 98 F (36.7 C) 97.8 F (36.6 C) 98.3 F (36.8 C)  TempSrc: Oral Oral Oral   SpO2: 95% 93% 96% 96%  Weight:      Height:         Intake/Output Summary (Last 24 hours) at 01/21/2022 1734 Last data filed at 01/21/2022 6283 Gross per 24 hour  Intake --  Output 3100 ml  Net -3100 ml      PHYSICAL EXAM  General: Well developed, well nourished, in no acute distress HEENT:  Normocephalic and atramatic Neck:  No JVD.  Lungs: Clear bilaterally to auscultation and percussion. Heart: HRRR . Normal S1 and S2 without gallops or murmurs.  Abdomen: Bowel sounds are positive, abdomen soft and non-tender  Msk:  Back normal, normal gait. Normal strength and tone for age. Extremities: No clubbing, cyanosis or edema.   Neuro: Alert and oriented X 3. Psych:  Good affect, responds appropriately   LABS: Basic Metabolic Panel: Recent Labs    01/20/22 0848 01/20/22 1300 01/21/22 0953  NA 139  --  137  K 3.8  --  3.3*  CL 104  --  101  CO2 24  --  26  GLUCOSE 117*  --  180*  BUN 20  --  22*  CREATININE 1.25* 1.22 1.42*  CALCIUM 9.1  --  8.6*  MG 2.3  --   --    Liver Function Tests: Recent Labs    01/19/22 0648 01/20/22 0848  AST 17 17  ALT 24 23  ALKPHOS 63 64  BILITOT 1.3* 1.4*  PROT 7.0 7.1  ALBUMIN 3.7 3.7   No results for input(s): "LIPASE", "AMYLASE" in the last 72 hours. CBC: Recent Labs    01/19/22 0648 01/20/22 0848 01/20/22 1300  WBC 12.5* 12.1* 11.2*  NEUTROABS 9.4* 9.1*  --   HGB 13.6 13.6 14.1  HCT 39.0 39.3 40.5  MCV 85.3 85.4 85.1  PLT 253  259 268   Cardiac Enzymes: No results for input(s): "CKTOTAL", "CKMB", "CKMBINDEX", "TROPONINI" in the last 72 hours. BNP: Invalid input(s): "POCBNP" D-Dimer: No results for input(s): "DDIMER" in the last 72 hours. Hemoglobin A1C: No results for input(s): "HGBA1C" in the last 72 hours. Fasting Lipid Panel: No results for input(s): "CHOL", "HDL", "LDLCALC", "TRIG", "CHOLHDL", "LDLDIRECT" in the last 72 hours. Thyroid Function Tests: No results for input(s): "TSH", "T4TOTAL", "T3FREE", "THYROIDAB" in the last 72 hours.  Invalid input(s): "FREET3" Anemia Panel: No results for input(s): "VITAMINB12", "FOLATE", "FERRITIN", "TIBC", "IRON", "RETICCTPCT" in the last 72 hours.  DG Chest 2 View  Result Date: 01/20/2022 CLINICAL DATA:  Shortness of breath EXAM: CHEST - 2 VIEW COMPARISON:  Chest x-ray dated October 29th 2023 FINDINGS: Cardiac and mediastinal contours are within normal limits. Diffuse interstitial opacities. Small bilateral pleural effusions. No evidence of pneumothorax. IMPRESSION: Unchanged pulmonary edema and small bilateral pleural effusions. Electronically Signed   By: Yetta Glassman M.D.   On: 01/20/2022 09:21     Echo previous echo with EF of around 50%  TELEMETRY: Sinus bradycardia  rate of around 50:  ASSESSMENT AND PLAN:  Principal Problem:   Congestive heart failure (CHF) (HCC) Atrial fibrillation paroxysmal Shortness of breath Status post cardioversion Mild obesity Possible obstructive sleep apnea Known coronary disease previous PCI and stent  Plan Transition from IV Lasix to torsemide 40 mg twice a day p.o. Continue low-dose metoprolol therapy for rate control for atrial fibrillation as well as heart failure Continue supplemental potassium we have also started spironolactone since patient had hypokalemia on diuretics We will probably restart amiodarone at 100 mg daily Hopefully the patient can be discharged soon and follow-up with cardiology as an  outpatient Recommend statin therapy for hyperlipidemia Maintain Eliquis therapy 5 mg twice a day for anticoagulation for atrial fibrillation paroxysmal  Medications on discharge Eliquis 5 mg twice a day Aspirin 81 mg daily Metoprolol succinate 25 mg at bedtime Amiodarone 100 mg daily Torsemide 40 mg twice a day Supplemental potassium 20 mg daily Spironolactone 25 mg daily Lipitor 40 mg daily   Alwyn Pea, MD 01/21/2022 5:34 PM

## 2022-01-21 NOTE — Progress Notes (Signed)
PROGRESS NOTE  Andres [name]    DOB: Oct 11, 1965, 56 y.o.  KVQ:[id number]    Code Status: Full Code   DOA: 01/20/2022   LOS: 0   Brief hospital course  Andres [name] is a 56 y.o. male with a PMH significant for HFpEF, CAD, prior tobacco use but not current smoker, history of MI, psoriasis, A-fib. At baseline, they live at home with their wife and perform all ADLs.  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   Please see my H&P from 10/30 for more comprehensive summary of recent hospitalizations.   They presented from home to the ED on 01/20/2022 with shortness of breath x1 days.  At baseline, he has no shortness of breath with his every day activities. Starting yesterday he began having severe orthopnea and shortness of breath when walking short distances including from his bed to the bathroom. Frequently sleeps in recliner and wakes up in middle of the night with shortness of breath. Endorses perceived weight gain, lower extremity swelling.  Denies fever, cough, hemoptysis or sputum production. In the past, he has had good response to IV Lasix 80 mg with metolazone. He states that he has not smoked for the past month. His wife still smokes around him and in their home. He currently denies any chest pain or shortness of breath while resting in his recliner.    In the ED, it was found that they had stable vital signs with the exception of mild bradycardia in the high 50s.  ECG showed sinus bradycardia. Significant findings included: Overall stable CBC, K+ 3.8, creatinine at baseline 1.25, troponin 10, magnesium 2.3. Chest x-ray showing diffuse pulmonary edema with small bilateral pleural effusions mostly unchanged since yesterday ED visit. On exam, patient appears calm without respiratory distress at rest.  Exam is mildly hypervolemic with 2+ pitting edema to mid shins bilaterally.  Lung sounds are clear bilaterally.   They were initially treated with 80 mg IV Lasix  in ED.  Endorsed good urine output with this dose.    Patient was admitted to medicine service for further workup and management of CHF exacerbation as outlined in detail below.  01/21/22 -improved.   Assessment & Plan  Principal Problem:   Congestive heart failure (CHF) (HCC)  HFpEF-TTE revealed ejection fraction 55 to 60% on 01/13/2022. Frequent hospitalizations in past month. Patient continues to have 1+ pitting edema in ankles but much improved on exam and subjectively.  -With very complex recent cardiac history and high recidivism rate, will be consulting cardiology to also follow and care for patient.   -Continue IV Lasix 80mg  q6hr. Can likely change to PO 11/1 -Daily weights. Attempt to identify a "dry weight" for patient to be able to monitor at home.  -Strict I's/O -Monitor electrolytes and replete PRN - decreased home metoprolol to 25mg  BID, sinus bradycardia - recommended getting a sleep study after discharge. He and wife are in agreement.   HTN- systolics have fluctuated 110s-140s.  - initiated spironolactone 12.5mg  for cardioprotective factors as well as pro-K+ benefits in setting of his diuresis causing hypokalemia.    H.o Afib s/p cardioversion 01/13/22  sinus bradycardia- asymptomatic.  - f/u cardiology             - continue eliquis x4 weeks s/p conversion - discontinued amiodarone - continue cardiac telemetry   CAD - continue ASA - nitro PRN - continue statin  Body mass index is 33.89 kg/m.  VTE ppx:  apixaban (  ELIQUIS) tablet 5 mg   Diet:     Diet   Diet Heart Room service appropriate? Yes; Fluid consistency: Thin   Consultants: cardiology Subjective 01/21/22    Pt reports feeling better today. Woke up from sleep last night with episode of dyspnea which was improved with sitting straight up at side of bed similar to his home episodes.He was laying slightly elevated from flat. Endorses improvement in his LE swelling. He and wife had several  questions which were addressed at time of encounter.    Objective   Vitals:   01/21/22 0245 01/21/22 0757 01/21/22 1117 01/21/22 1328  BP: 118/68 109/62 112/64 127/69  Pulse: (!) 53 (!) 50 (!) 50 (!) 52  Resp: 13 12 15 15   Temp: 98.5 F (36.9 C) 98.1 F (36.7 C) 98 F (36.7 C) 97.8 F (36.6 C)  TempSrc: Oral Oral Oral Oral  SpO2: 93% 95% 93% 96%  Weight:      Height:        Intake/Output Summary (Last 24 hours) at 01/21/2022 1427 Last data filed at 01/21/2022 0814 Gross per 24 hour  Intake --  Output 3900 ml  Net -3900 ml   Filed Weights   01/20/22 0830  Weight: 116.5 kg     Physical Exam:  General: awake, alert, NAD HEENT: atraumatic, clear conjunctiva, anicteric sclera, MMM, hearing grossly normal Respiratory: normal respiratory effort. CTAB Cardiovascular: quick capillary refill, normal S1/S2, RRR, no JVD, murmurs. 1+ pitting edema to ankles bilaterally Nervous: A&O x3. no gross focal neurologic deficits, normal speech Extremities: moves all equally, no edema, normal tone Skin: dry, intact, normal temperature, normal color. No rashes, lesions or ulcers on exposed skin Psychiatry: normal mood, congruent affect  Labs   I have personally reviewed the following labs and imaging studies CBC    Component Value Date/Time   WBC 11.2 (H) 01/20/2022 1300   RBC 4.76 01/20/2022 1300   HGB 14.1 01/20/2022 1300   HGB 16.4 07/15/2013 0505   HCT 40.5 01/20/2022 1300   HCT 46.6 07/15/2013 0505   PLT 268 01/20/2022 1300   PLT 256 07/15/2013 0505   MCV 85.1 01/20/2022 1300   MCV 88 07/15/2013 0505   MCH 29.6 01/20/2022 1300   MCHC 34.8 01/20/2022 1300   RDW 13.6 01/20/2022 1300   RDW 13.0 07/15/2013 0505   LYMPHSABS 1.8 01/20/2022 0848   LYMPHSABS 1.8 10/16/2011 0147   MONOABS 0.9 01/20/2022 0848   MONOABS 0.8 10/16/2011 0147   EOSABS 0.3 01/20/2022 0848   EOSABS 0.1 10/16/2011 0147   BASOSABS 0.1 01/20/2022 0848   BASOSABS 0.1 10/16/2011 0147      Latest Ref  Rng & Units 01/21/2022    9:53 AM 01/20/2022    1:00 PM 01/20/2022    8:48 AM  BMP  Glucose 70 - 99 mg/dL 01/22/2022   628   BUN 6 - 20 mg/dL 22   20   Creatinine 315 - 1.24 mg/dL 1.76  1.60  7.37   Sodium 135 - 145 mmol/L 137   139   Potassium 3.5 - 5.1 mmol/L 3.3   3.8   Chloride 98 - 111 mmol/L 101   104   CO2 22 - 32 mmol/L 26   24   Calcium 8.9 - 10.3 mg/dL 8.6   9.1     DG Chest 2 View  Result Date: 01/20/2022 CLINICAL DATA:  Shortness of breath EXAM: CHEST - 2 VIEW COMPARISON:  Chest x-ray dated October 29th 2023 FINDINGS: Cardiac  and mediastinal contours are within normal limits. Diffuse interstitial opacities. Small bilateral pleural effusions. No evidence of pneumothorax. IMPRESSION: Unchanged pulmonary edema and small bilateral pleural effusions. Electronically Signed   By: Yetta Glassman M.D.   On: 01/20/2022 09:21    Disposition Plan & Communication  Patient status: Inpatient  Admitted From: Home Planned disposition location: Home Anticipated discharge date: 11/1 pending stabilization on medication  Family Communication: wife at bedside    Author: Richarda Osmond, DO [facility] 01/21/2022, 2:27 PM   Available by Epic secure chat 7AM-7PM. If 7PM-7AM, please contact night-coverage.  [facility] contact information found on [email or web address].

## 2022-01-21 NOTE — Progress Notes (Signed)
       CROSS COVER NOTE  NAME: CARNEL STEGMAN MRN: [id number] DOB : [birth year] ATTENDING PHYSICIAN: Richarda Osmond, MD    Date of Service   01/21/2022   HPI/Events of Note   Medication request received for sleep aid.  Interventions   Assessment/Plan:  Melatonin x1    This document was prepared using Dragon voice recognition software and may include unintentional dictation errors.  Neomia Glass DNP, MBA, FNP-BC Nurse Practitioner [facility] Pager [phone]

## 2022-01-22 DIAGNOSIS — I5023 Acute on chronic systolic (congestive) heart failure: Secondary | ICD-10-CM | POA: Diagnosis not present

## 2022-01-22 LAB — BASIC METABOLIC PANEL
Anion gap: 9 (ref 5–15)
BUN: 24 mg/dL — ABNORMAL HIGH (ref 6–20)
CO2: 24 mmol/L (ref 22–32)
Calcium: 8.9 mg/dL (ref 8.9–10.3)
Chloride: 103 mmol/L (ref 98–111)
Creatinine, Ser: 1.34 mg/dL — ABNORMAL HIGH (ref 0.61–1.24)
GFR, Estimated: >60 mL/min (ref >60)
Glucose, Bld: 150 mg/dL — ABNORMAL HIGH (ref 70–99)
Potassium: 3.5 mmol/L (ref 3.5–5.1)
Sodium: 136 mmol/L (ref 135–145)

## 2022-01-22 MED ORDER — LISINOPRIL 2.5 MG PO TABS: 2.5000 mg | ORAL_TABLET | Freq: Every day | ORAL | 6 refills | Status: DC

## 2022-01-22 MED ORDER — LISINOPRIL 5 MG PO TABS
2.5000 mg | ORAL_TABLET | Freq: Every day | ORAL | Status: DC
  Administered 2022-01-22: 2.5 mg via ORAL
  Filled 2022-01-22: qty 1

## 2022-01-22 MED ORDER — METOPROLOL TARTRATE 75 MG PO TABS: 12.5000 mg | ORAL_TABLET | Freq: Two times a day (BID) | ORAL | 1 refills | Status: DC

## 2022-01-22 MED ORDER — METOPROLOL TARTRATE 25 MG PO TABS: 12.5000 mg | ORAL_TABLET | Freq: Two times a day (BID) | ORAL | Status: DC

## 2022-01-22 MED ORDER — TORSEMIDE 40 MG PO TABS: 40.0000 mg | ORAL_TABLET | Freq: Two times a day (BID) | ORAL | 5 refills | Status: DC

## 2022-01-22 MED ORDER — AMIODARONE HCL 200 MG PO TABS: 100.0000 mg | ORAL_TABLET | Freq: Every day | ORAL | 0 refills | Status: DC

## 2022-01-22 MED ORDER — SPIRONOLACTONE 25 MG PO TABS: 25.0000 mg | ORAL_TABLET | Freq: Every day | ORAL | 6 refills | Status: DC

## 2022-01-22 NOTE — Progress Notes (Signed)
[facility] Cardiology    SUBJECTIVE: States he feels reasonably well no shortness of breath no chest pain no palpitation tachycardia would like to be discharged today hopefully to follow-up with cardiology soon has an appointment in heart failure clinic today   Vitals:   01/22/22 0023 01/22/22 0528 01/22/22 0542 01/22/22 0817  BP: 111/60  (!) 127/57 127/80  Pulse: (!) 53  (!) 56 (!) 55  Resp: 16  16 16   Temp: 98.4 F (36.9 C)  97.8 F (36.6 C) 97.6 F (36.4 C)  TempSrc: Oral   Oral  SpO2: 93%  96% 97%  Weight:  112.3 kg    Height:         Intake/Output Summary (Last 24 hours) at 01/22/2022 0841 Last data filed at 01/21/2022 2130 Gross per 24 hour  Intake 480 ml  Output 1100 ml  Net -620 ml      PHYSICAL EXAM  General: Well developed, well nourished, in no acute distress HEENT:  Normocephalic and atramatic Neck:  No JVD.  Lungs: Clear bilaterally to auscultation and percussion. Heart: HRRR . Normal S1 and S2 without gallops or murmurs.  Abdomen: Bowel sounds are positive, abdomen soft and non-tender  Msk:  Back normal, normal gait. Normal strength and tone for age. Extremities: No clubbing, cyanosis or edema.   Neuro: Alert and oriented X 3. Psych:  Good affect, responds appropriately   LABS: Basic Metabolic Panel: Recent Labs    01/20/22 0848 01/20/22 1300 01/21/22 0953 01/22/22 0647  NA 139  --  137 136  K 3.8  --  3.3* 3.5  CL 104  --  101 103  CO2 24  --  26 24  GLUCOSE 117*  --  180* 150*  BUN 20  --  22* 24*  CREATININE 1.25*   < > 1.42* 1.34*  CALCIUM 9.1  --  8.6* 8.9  MG 2.3  --   --   --    < > = values in this interval not displayed.   Liver Function Tests: Recent Labs    01/20/22 0848  AST 17  ALT 23  ALKPHOS 64  BILITOT 1.4*  PROT 7.1  ALBUMIN 3.7   No results for input(s): "LIPASE", "AMYLASE" in the last 72 hours. CBC: Recent Labs    01/20/22 0848 01/20/22 1300  WBC 12.1* 11.2*  NEUTROABS 9.1*  --   HGB 13.6 14.1  HCT 39.3 40.5   MCV 85.4 85.1  PLT 259 268   Cardiac Enzymes: No results for input(s): "CKTOTAL", "CKMB", "CKMBINDEX", "TROPONINI" in the last 72 hours. BNP: Invalid input(s): "POCBNP" D-Dimer: No results for input(s): "DDIMER" in the last 72 hours. Hemoglobin A1C: No results for input(s): "HGBA1C" in the last 72 hours. Fasting Lipid Panel: No results for input(s): "CHOL", "HDL", "LDLCALC", "TRIG", "CHOLHDL", "LDLDIRECT" in the last 72 hours. Thyroid Function Tests: No results for input(s): "TSH", "T4TOTAL", "T3FREE", "THYROIDAB" in the last 72 hours.  Invalid input(s): "FREET3" Anemia Panel: No results for input(s): "VITAMINB12", "FOLATE", "FERRITIN", "TIBC", "IRON", "RETICCTPCT" in the last 72 hours.  DG Chest 2 View  Result Date: 01/20/2022 CLINICAL DATA:  Shortness of breath EXAM: CHEST - 2 VIEW COMPARISON:  Chest x-ray dated October 29th 2023 FINDINGS: Cardiac and mediastinal contours are within normal limits. Diffuse interstitial opacities. Small bilateral pleural effusions. No evidence of pneumothorax. IMPRESSION: Unchanged pulmonary edema and small bilateral pleural effusions. Electronically Signed   By: Yetta Glassman M.D.   On: 01/20/2022 09:21     Echo  previous echocardiogram with EF around 50%  TELEMETRY: Sinus bradycardia rate of 55 nonspecific T2 changes:  ASSESSMENT AND PLAN:  Principal Problem:   Congestive heart failure (CHF) (HCC) Atrial fibrillation Shortness of breath Obesity Hyperlipidemia Possible obstructive sleep apnea Coronary artery disease Hypokalemia Bradycardia Renal insufficiency stage I  Plan Hopefully can consider discharge today Continue Eliquis 5 mg twice a day to help with anticoagulation for A-fib Reduce amiodarone to 100 mg daily Reduce metoprolol tartrate to 12.5 twice a day to help with rate control We will start lisinopril 2.5 mg daily for heart failure management We will transition to torsemide 40 mg twice a day in place of Lasix for  diuresis Maintain Lipitor therapy for statin management and lipid care Continue to follow monitor renal function Recommend weight loss exercise portion control Spironolactone has been added for heart failure as well as hypokalemia management Continue supplemental potassium for now we will recheck electrolytes within a few weeks Recommend sleep study to rule out obstructive sleep apnea as an etiology for his recurrent episodes of shortness of breath and heart failure Follow-up with cardiology 1 to 2 weeks  Yolonda Kida, MD, 01/22/2022 8:41 AM

## 2022-01-22 NOTE — Progress Notes (Signed)
DC instructions and med list reviewed with pt and SO who verbalize understanding.  DC med list, low NA diet, follow up instructions provided and reviewed.  Pt to follow up with PCP and cardiology.  Tele and IV removed.

## 2022-01-22 NOTE — Discharge Instructions (Signed)

## 2022-01-22 NOTE — Progress Notes (Addendum)
   Heart Failure Nurse Navigator Note   Met with patient today, he was sitting up in the chair at bedside in street close.  States that he is being discharged home.  By teach back method went over daily weights, to report fluid restriction and sodium restriction.  He needed very little reinforcement. Made  aware that his discharge weight today was 247#.  Also discussed the new medications that had been added, that include lisinopril and spironolactone and transitioning from furosemide to torsemide.  He had no further questions.  He will follow-up in the outpatient heart failure clinic on November 8 at [place]

## 2022-01-22 NOTE — Discharge Summary (Signed)
Physician Discharge Summary   Patient: Andres [name] MRN: [id number]  DOB: 02-Aug-1965   Admit:     Date of Admission: 01/20/2022 Admitted from: home   Discharge: Date of discharge: 01/22/22 Disposition: Home Condition at discharge: good  CODE STATUS: FULL CODE     Discharge Physician: Emeterio Reeve, DO [facility]     PCP: [facility]  Recommendations for Outpatient Follow-up:  Follow up with PCP [place] in 1-2 weeks Please obtain labs/tests: CBC, BMP in 1-2 weeks Follow w/ cardiology as directed in 1 week  Please follow up on the following pending results: none PCP AND OTHER OUTPATIENT PROVIDERS: SEE [place] ADDITION TO GENERIC AVS PATIENT INFO    Discharge Instructions     Diet - low sodium heart healthy   Complete by: As directed    Increase activity slowly   Complete by: As directed    Other Restrictions   Complete by: As directed    Return to work based on cardiology follow-up appointment.         Discharge Diagnoses: Principal Problem:   Congestive heart failure (CHF) [facility])       Hospital Course: Andres [name] is a 56 y.o. male with a PMH significant for HFpEF, CAD, prior tobacco use but not current smoker, history of MI, psoriasis, A-fib. At baseline, they live at home with their wife and perform all ADLs.  They work full-time in a [employer] as a Therapist, occupational.  They presented from home to the ED on 01/20/2022 with shortness of breath x1 days.  At baseline, he has no shortness of breath with his every day activities. Starting yesterday he began having severe orthopnea and shortness of breath when walking short distances including from his bed to the bathroom. Frequently sleeps in recliner and wakes up in middle of the night with shortness of breath. Endorses perceived weight gain, lower extremity swelling.  Denies fever, cough,  hemoptysis or sputum production. In the past, he has had good response to IV Lasix 80 mg with metolazone. He states that he has not smoked for the past month. His wife still smokes around him and in their home. He currently denies any chest pain or shortness of breath while resting in his recliner.   In the ED, it was found that they had stable vital signs with the exception of mild bradycardia in the high 50s.  ECG showed sinus bradycardia. Significant findings included: Overall stable CBC, K+ 3.8, creatinine at baseline 1.25, troponin 10, magnesium 2.3. Chest x-ray showing diffuse pulmonary edema with small bilateral pleural effusions mostly unchanged since yesterday ED visit. On exam, patient appears calm without respiratory distress at rest.  Exam is mildly hypervolemic with 2+ pitting edema to mid shins bilaterally.  Lung sounds are clear bilaterally.  They were initially treated with 80 mg IV Lasix in ED.  Endorsed good urine output with this dose.   Patient was admitted to medicine service  TTE revealed ejection fraction 55 to 60% on 01/13/2022.  01/21/22 -improved. Cardiology following  01/22/22 - stable for discharge   Plan:  Continue Eliquis 5 mg twice a day to help with anticoagulation for A-fib Reduce amiodarone to 100 mg daily Reduce metoprolol tartrate to 12.5 twice a day to help with rate control We will start lisinopril 2.5 mg daily for heart failure management We will transition to torsemide 40 mg twice a day  in place of Lasix for diuresis Maintain Lipitor therapy for statin management and lipid care Continue to follow monitor renal function Recommend weight loss exercise portion control Spironolactone has been added for heart failure as well as hypokalemia management Continue supplemental potassium for now we will recheck electrolytes within a few weeks Recommend sleep study to rule out obstructive sleep apnea as an etiology for his recurrent episodes of shortness of  breath and heart failure Follow-up with cardiology 1 to 2 weeks    Discharge Instructions  Allergies as of 01/22/2022       Reactions   Statins Other (See Comments)   Myalgias, cannot remember which statin he took in the past but willing to try atorvastatin 12/2021        Medication List     STOP taking these medications    furosemide 20 MG tablet Commonly known as: LASIX       TAKE these medications    amiodarone 200 MG tablet Commonly known as: Pacerone Take 0.5 tablets (100 mg total) by mouth daily. Until Friday, then start taking once daily What changed:  how much to take when to take this   apixaban 5 MG Tabs tablet Commonly known as: ELIQUIS Take 1 tablet (5 mg total) by mouth 2 (two) times daily.   aspirin EC 81 MG tablet Take 1 tablet (81 mg total) by mouth daily. Swallow whole.   atorvastatin 40 MG tablet Commonly known as: LIPITOR Take 1 tablet (40 mg total) by mouth every evening.   Cyanocobalamin 1000 MCG Tbcr Take 1 tablet by mouth daily.   lisinopril 2.5 MG tablet Commonly known as: ZESTRIL Take 1 tablet (2.5 mg total) by mouth daily.   Metoprolol Tartrate 75 MG Tabs Take 12.5 mg by mouth 2 (two) times daily. What changed: how much to take   nitroGLYCERIN 0.4 MG SL tablet Commonly known as: NITROSTAT 1 TABLET UNDER TONGUE AT ONSET OF CHEST PAIN. REPEAT IN 5 MIN IF NOT RESOLVED, MAX 3 DOSES. 911 IF NEEDED.   potassium chloride SA 20 MEQ tablet Commonly known as: KLOR-CON M Take 1 tablet (20 mEq total) by mouth daily.   spironolactone 25 MG tablet Commonly known as: ALDACTONE Take 1 tablet (25 mg total) by mouth daily. Start taking on: January 23, 2022   Torsemide 40 MG Tabs Take 40 mg by mouth 2 (two) times daily.         Follow-up Information     Yolonda Kida, MD. Go in 1 week(s).   Specialties: Cardiology, Internal Medicine Why: Appointment on Thursday, 01/30/2022 at 8:00am. Contact information: [place] [address] [phone]                 Allergies  Allergen Reactions   Statins Other (See Comments)    Myalgias, cannot remember which statin he took in the past but willing to try atorvastatin 12/2021     Subjective: pt feeling well today, anticipating going home, no CP/SOB.    Discharge Exam: BP 127/80 (BP Location: Right Arm)   Pulse (!) 55   Temp 97.6 F (36.4 C) (Oral)   Resp 16   Ht 6\' 1"  (1.854 m)   Wt 112.3 kg   SpO2 97%   BMI 32.66 kg/m  General: Pt is alert, awake, not in acute distress Cardiovascular: RRR, S1/S2 +, no rubs, no gallops Respiratory: CTA bilaterally, no wheezing, no rhonchi Abdominal: Soft, NT, ND, bowel sounds + Extremities: no edema, no cyanosis  The results of significant diagnostics from this hospitalization (including imaging, microbiology, ancillary and laboratory) are listed below for reference.     Microbiology: No results found for this or any previous visit (from the past 240 hour(s)).   Labs: BNP (last 3 results) Recent Labs    01/10/22 0805 01/19/22 0648 01/20/22 1310  BNP 1,214.8* 1,275.5* [id number]*   Basic Metabolic Panel: Recent Labs  Lab 01/16/22 0040 01/19/22 0648 01/20/22 0848 01/20/22 1300 01/21/22 0953 01/22/22 0647  NA 136 137 139  --  137 136  K 2.6* 3.5 3.8  --  3.3* 3.5  CL 96* 100 104  --  101 103  CO2 27 28 24   --  26 24  GLUCOSE 97 122* 117*  --  180* 150*  BUN 24* 21* 20  --  22* 24*  CREATININE 1.52* 1.31* 1.25* 1.22 1.42* 1.34*  CALCIUM 9.7 9.1 9.1  --  8.6* 8.9  MG 1.9  --  2.3  --   --   --    Liver Function Tests: Recent Labs  Lab 01/19/22 0648 01/20/22 0848  AST 17 17  ALT 24 23  ALKPHOS 63 64  BILITOT 1.3* 1.4*  PROT 7.0 7.1  ALBUMIN 3.7 3.7   No results for input(s): "LIPASE", "AMYLASE" in the last 168 hours. No results for input(s): "AMMONIA" in the last 168 hours. CBC: Recent Labs  Lab 01/16/22 0040 01/19/22 0648 01/20/22 0848 01/20/22 1300   WBC 14.2* 12.5* 12.1* 11.2*  NEUTROABS 9.6* 9.4* 9.1*  --   HGB 14.5 13.6 13.6 14.1  HCT 41.5 39.0 39.3 40.5  MCV 83.8 85.3 85.4 85.1  PLT 331 253 259 268   Cardiac Enzymes: No results for input(s): "CKTOTAL", "CKMB", "CKMBINDEX", "TROPONINI" in the last 168 hours. BNP: Invalid input(s): "POCBNP" CBG: No results for input(s): "GLUCAP" in the last 168 hours. D-Dimer No results for input(s): "DDIMER" in the last 72 hours. Hgb A1c No results for input(s): "HGBA1C" in the last 72 hours. Lipid Profile No results for input(s): "CHOL", "HDL", "LDLCALC", "TRIG", "CHOLHDL", "LDLDIRECT" in the last 72 hours. Thyroid function studies No results for input(s): "TSH", "T4TOTAL", "T3FREE", "THYROIDAB" in the last 72 hours.  Invalid input(s): "FREET3" Anemia work up No results for input(s): "VITAMINB12", "FOLATE", "FERRITIN", "TIBC", "IRON", "RETICCTPCT" in the last 72 hours. Urinalysis    Component Value Date/Time   COLORURINE YELLOW (A) 01/19/2022 0648   APPEARANCEUR CLEAR (A) 01/19/2022 0648   APPEARANCEUR Clear 07/15/2013 0505   LABSPEC 1.018 01/19/2022 0648   LABSPEC 1.009 07/15/2013 0505   PHURINE 5.0 01/19/2022 0648   GLUCOSEU NEGATIVE 01/19/2022 0648   GLUCOSEU Negative 07/15/2013 0505   HGBUR NEGATIVE 01/19/2022 [place] 01/19/2022 0648   BILIRUBINUR Negative 07/15/2013 0505   KETONESUR NEGATIVE 01/19/2022 0648   PROTEINUR NEGATIVE 01/19/2022 0648   NITRITE NEGATIVE 01/19/2022 0648   LEUKOCYTESUR NEGATIVE 01/19/2022 0648   LEUKOCYTESUR Negative 07/15/2013 0505   Sepsis Labs Recent Labs  Lab 01/16/22 0040 01/19/22 0648 01/20/22 0848 01/20/22 1300  WBC 14.2* 12.5* 12.1* 11.2*   Microbiology No results found for this or any previous visit (from the past 240 hour(s)). Imaging DG Chest 2 View  Result Date: 01/20/2022 CLINICAL DATA:  Shortness of breath EXAM: CHEST - 2 VIEW COMPARISON:  Chest x-ray dated October 29th 2023 FINDINGS: Cardiac and  mediastinal contours are within normal limits. Diffuse interstitial opacities. Small bilateral pleural effusions. No evidence of pneumothorax. IMPRESSION: Unchanged pulmonary edema and small bilateral pleural effusions.  Electronically Signed   By: Yetta Glassman M.D.   On: 01/20/2022 09:21      Time coordinating discharge: over 30 minutes  SIGNED:  Emeterio Reeve DO [facility]

## 2022-01-22 NOTE — Progress Notes (Signed)
Nutrition Brief Note  RD pulled to chart secondary to CHF.   Wt Readings from Last 15 Encounters:  01/22/22 112.3 kg  01/19/22 115.2 kg  01/16/22 110.2 kg  01/13/22 110.2 kg  01/09/22 112.5 kg  01/08/22 112.7 kg  01/02/22 114.3 kg   Pt with a PMH significant for HFpEF, CAD, prior tobacco use but not current smoker, history of MI, psoriasis, A-fib.  Pt admitted with CHF.   RD provided "Low Sodium Nutrition Therapy" handout from [facility] Nutrition Care Manual; attached to AVS/ discharge summary.   Per MD notes, probable discharge home today.   Current diet order is Heart Healthy (liberalized to 2 gram sodium), patient is consuming approximately n/a% of meals at this time. Labs and medications reviewed.   No nutrition interventions warranted at this time. If nutrition issues arise, please consult RD.   Andres [name], RD, LDN, [place] Registered Dietitian II Certified Diabetes Care and Education Specialist Please refer to [facility] for RD and/or RD on-call/weekend/after hours pager

## 2022-01-22 NOTE — Plan of Care (Signed)

## 2022-01-28 NOTE — Progress Notes (Unsigned)
   Patient ID: Andres [name], male    DOB: 07-30-1965, 56 y.o.   MRN: [id number]  HPI  Andres [name] is a 56 y/o male with a history of  TEE 01/13/22 showed EF 55-60%, moderate LAE, NO thrombus, moderate grade III plaque, can not exclude small PFO. Successful direct current cardioversion.  Echo report from 01/07/22 showed EF 65-70% with mild LAE and moderate Andres.   LHC and angiography done 01/06/22 and showed:   Prox RCA lesion is 20% stenosed.   Mid RCA lesion is 20% stenosed.   Dist RCA lesion is 40% stenosed.   RPAV lesion is 40% stenosed.   Prox Cx lesion is 50% stenosed.   2nd Mrg lesion is 50% stenosed.   1st Mrg lesion is 30% stenosed.   Prox LAD lesion is 50% stenosed.   1st Diag lesion is 60% stenosed.   Previously placed Prox LAD to Mid LAD stent of unknown type is  widely patent.   The left ventricular systolic function is normal.   The left ventricular ejection fraction is 50-55% by visual estimate.  1.  Diffuse moderate nonobstructive coronary artery disease with patent stent proximal/mid LAD, 50% stenosis proximal LAD, 50% stenosis proximal left circumflex, 40% distal RCA 2.  Normal left ventricular function with estimated LVEF 50-55%  Admitted 01/20/22 due to shortness of breath, weight gain and pedal edema. Initially given IV lasix with transition to or diuretics. Cardiology consult obtained. Discharged after 2 days. Was in the ED 01/19/22 due to acute on chronic HF where he was treated and released. Was in the ED 01/16/22 due to hypokalemia with a potassium of 2.5 reported by his PCP. EKG Rate: 54  Rhythm: normal EKG, normal sinus rhythm  Axis: nl Intervals:long qtc 600, no ischemic changes, min ST depressions in lateral leads. A re-check of his ECG shows resolution of prior QTC prolongation and lateral STD. IV/PO potassium given and he was released. Admitted 01/10/22 due to SOB with AF RVR. Cardiology consult obtained. Started on diltiazem gtt which was stopped after 1 day.  Echo done with successful cardioversion. CXR showed pulmonary edema. Given metolazone and IV lasix. Transitioned to oral lasix with potassium. Discharged after 3 days. Admitted 01/05/22 due to SOB/ chest pain. Needed 5L oxygen due to hypoxia. CXR showed pulmonary edema and lasix started. Cardiology consult obtained. Patient underwent left heart catheterization on 01/06/2022 which shows Diffuse moderate nonobstructive coronary artery disease with patent stent proximal/mid LAD, 50% stenosis proximal LAD, 50% stenosis proximal left circumflex, 40% distal RCA.  Widely patent previously placed stent in LAD. Short runs of NSVT & intermittent AF/RVR and metoprolol increased. Weaned off oxygen. Discharged after 3 days.   He presents today for his initial visit with a chief complaint of  Review of Systems    Physical Exam  Assessment & Plan:  1: Chronic heart failure with preserved ejection fraction with structural changes (LAE)- - NYHA class - on GDMT of  - BNP 01/20/22 was 937.3  2: Atrial fibrillation- -  3: Hypokalemia- - saw PCP @ [facility]  01/15/22 - BMP 01/22/22 reviewed and showed sodium 136, potassium 3.5, creatinine 1.34 and GFR >60 - repeat BMP today  4: Tobacco use- -  5: CAD- - previous stent placement in LAD in 2013

## 2022-01-29 ENCOUNTER — Encounter: Payer: Self-pay | Admitting: Family

## 2022-01-29 ENCOUNTER — Ambulatory Visit (HOSPITAL_BASED_OUTPATIENT_CLINIC_OR_DEPARTMENT_OTHER): Payer: BC Managed Care – PPO | Admitting: Family

## 2022-01-29 ENCOUNTER — Encounter: Payer: Self-pay | Admitting: Pharmacist

## 2022-01-29 ENCOUNTER — Telehealth: Payer: Self-pay

## 2022-01-29 ENCOUNTER — Other Ambulatory Visit
Admission: RE | Admit: 2022-01-29 | Discharge: 2022-01-29 | Disposition: A | Discharge status: Home / Self Care | Payer: BC Managed Care – PPO | Source: Ambulatory Visit | Attending: Family | Admitting: Family

## 2022-01-29 ENCOUNTER — Other Ambulatory Visit: Payer: Self-pay

## 2022-01-29 VITALS — BP 156/68 | HR 66 | Resp 18 | Ht 72.0 in | Wt 249.4 lb

## 2022-01-29 DIAGNOSIS — Z01810 Encounter for preprocedural cardiovascular examination: Secondary | ICD-10-CM | POA: Diagnosis not present

## 2022-01-29 DIAGNOSIS — I2510 Atherosclerotic heart disease of native coronary artery without angina pectoris: Secondary | ICD-10-CM | POA: Diagnosis not present

## 2022-01-29 DIAGNOSIS — Z955 Presence of coronary angioplasty implant and graft: Secondary | ICD-10-CM | POA: Insufficient documentation

## 2022-01-29 DIAGNOSIS — Z7901 Long term (current) use of anticoagulants: Secondary | ICD-10-CM | POA: Insufficient documentation

## 2022-01-29 DIAGNOSIS — Z87442 Personal history of urinary calculi: Secondary | ICD-10-CM | POA: Insufficient documentation

## 2022-01-29 DIAGNOSIS — I480 Paroxysmal atrial fibrillation: Secondary | ICD-10-CM

## 2022-01-29 DIAGNOSIS — Z79899 Other long term (current) drug therapy: Secondary | ICD-10-CM | POA: Insufficient documentation

## 2022-01-29 DIAGNOSIS — I5032 Chronic diastolic (congestive) heart failure: Secondary | ICD-10-CM | POA: Insufficient documentation

## 2022-01-29 DIAGNOSIS — I4891 Unspecified atrial fibrillation: Secondary | ICD-10-CM | POA: Insufficient documentation

## 2022-01-29 DIAGNOSIS — E876 Hypokalemia: Secondary | ICD-10-CM | POA: Insufficient documentation

## 2022-01-29 DIAGNOSIS — I110 Hypertensive heart disease with heart failure: Secondary | ICD-10-CM | POA: Insufficient documentation

## 2022-01-29 LAB — BASIC METABOLIC PANEL
Anion gap: 11 (ref 5–15)
BUN: 27 mg/dL — ABNORMAL HIGH (ref 6–20)
CO2: 22 mmol/L (ref 22–32)
Calcium: 9.2 mg/dL (ref 8.9–10.3)
Chloride: 105 mmol/L (ref 98–111)
Creatinine, Ser: 1.73 mg/dL — ABNORMAL HIGH (ref 0.61–1.24)
GFR, Estimated: 45 mL/min — ABNORMAL LOW (ref >60)
Glucose, Bld: 107 mg/dL — ABNORMAL HIGH (ref 70–99)
Potassium: 3.9 mmol/L (ref 3.5–5.1)
Sodium: 138 mmol/L (ref 135–145)

## 2022-01-29 MED ORDER — LISINOPRIL 5 MG PO TABS: 5.0000 mg | ORAL_TABLET | Freq: Every day | ORAL | 3 refills | Status: DC

## 2022-01-29 NOTE — Patient Instructions (Addendum)
Medication Changes:  INCREASE lisinopril to 5 mg once a day  Lab Work:  Labs done today, your results will be available in MyChart, we will contact you for abnormal readings.   Testing/Procedures:  None  Referrals:  None  Special Instructions // Education:  Do the following things EVERYDAY: Weigh yourself in the morning before breakfast. Write it down and keep it in a log. Take your medicines as prescribed Eat low salt foods--Limit salt (sodium) to 2000 mg per day.  Stay as active as you can everyday Limit all fluids for the day to less than 2 liters   Follow-Up in:  Follow up in 2 weeks    If you have any questions or concerns before your next appointment please send [place] a message through [place] or call our office at [phone]    If you have voicemail, please make sure your mailbox is cleaned out so that we may leave a message and please make sure to listen to any voicemails.

## 2022-01-29 NOTE — Progress Notes (Signed)
Patient ID: Andres [name], male   DOB: 07-08-1965, 56 y.o.   MRN: [id number] Wink - PHARMACIST COUNSELING NOTE  Guideline-Directed Medical Therapy/Evidence Based Medicine  ACE/ARB/ARNI: Lisinopril 2.5 mg daily Beta Blocker: Metoprolol tartrate 12.5 mg twice daily Aldosterone Antagonist: Spironolactone 25 mg daily Diuretic: Torsemide 40 mg twice daily SGLT2i:  none  Adherence Assessment  Do you ever forget to take your medication? [] Yes [x] No  Do you ever skip doses due to side effects? [] Yes [x] No  Do you have trouble affording your medicines? [] Yes [x] No  Are you ever unable to pick up your medication due to transportation difficulties? [] Yes [x] No  Do you ever stop taking your medications because you don't believe they are helping? [] Yes [x] No  Do you check your weight daily? [x] Yes [] No   Adherence strategy: pill box  Barriers to obtaining medications: none  Vital signs: HR 66, BP 156/68, weight (pounds) 249 lbs  ECHO: Date 01/13/22, EF 55-60%, moderate LAE, NO thrombus, moderate grade III plaque, can not exclude small PFO.   Cath: Date 01/06/2022, EF 50-55%, patent stent proximal/mid LAD, 50% stenosis proximal LAD, 50% stenosis proximal left circumflex, 40% distal RCA      Latest Ref Rng & Units 01/22/2022    6:47 AM 01/21/2022    9:53 AM 01/20/2022    1:00 PM  BMP  Glucose 70 - 99 mg/dL 150  180    BUN 6 - 20 mg/dL 24  22    Creatinine 0.61 - 1.24 mg/dL 1.34  1.42  1.22   Sodium 135 - 145 mmol/L 136  137    Potassium 3.5 - 5.1 mmol/L 3.5  3.3    Chloride 98 - 111 mmol/L 103  101    CO2 22 - 32 mmol/L 24  26    Calcium 8.9 - 10.3 mg/dL 8.9  8.6      Past Medical History:  Diagnosis Date   A-fib (HCC)    CHF (congestive heart failure) (HCC)    Coronary artery disease    History of kidney stones     ASSESSMENT 56 year old male who presents to the HF clinic for initial visit. PMH includes CAD, atrial  fibrillation, kidney stones, tobacco use , and HFpEF. Last acute care admission was 01/20/2022 for acute on chronic heart failure.   MedRec completed during OV. Multiple medication questions answered. Patient denies wheezing, increased SOB, dizziness, swelling, chest pain, decreased appetite, or headaches.  PLAN  Increase lisinopril to 5mg  daily Repeat BMP and consider adding SGLT2i if renal function and electrolytes stable Noted f/u visit with Dr Clayborn Bigness tomorrow for A.fib follow up. Will defer BB titration until follow by cardiologist.   Time spent: 15 minutes  Andres [name] PharmD, BCPS 01/29/2022 10:56 AM   Current Outpatient Medications:    amiodarone (PACERONE) 200 MG tablet, Take 0.5 tablets (100 mg total) by mouth daily. Until Friday, then start taking once daily, Disp: 15 tablet, Rfl: 0   apixaban (ELIQUIS) 5 MG TABS tablet, Take 1 tablet (5 mg total) by mouth 2 (two) times daily., Disp: 60 tablet, Rfl: 0   aspirin EC 81 MG tablet, Take 1 tablet (81 mg total) by mouth daily. Swallow whole., Disp: 30 tablet, Rfl: 12   atorvastatin (LIPITOR) 40 MG tablet, Take 1 tablet (40 mg total) by mouth every evening., Disp: 30 tablet, Rfl: 2   Cyanocobalamin 1000 MCG TBCR, Take 1 tablet by mouth daily., Disp: , Rfl:  lisinopril (ZESTRIL) 5 MG tablet, Take 1 tablet (5 mg total) by mouth daily., Disp: 30 tablet, Rfl: 3   metoprolol tartrate (LOPRESSOR) 25 MG tablet, Take 12.5 mg by mouth 2 (two) times daily., Disp: , Rfl:    nitroGLYCERIN (NITROSTAT) 0.4 MG SL tablet, 1 TABLET UNDER TONGUE AT ONSET OF CHEST PAIN. REPEAT IN 5 MIN IF NOT RESOLVED, MAX 3 DOSES. 911 IF NEEDED. (Patient not taking: Reported on 01/29/2022), Disp: , Rfl:    potassium chloride SA (KLOR-CON M) 20 MEQ tablet, Take 1 tablet (20 mEq total) by mouth daily., Disp: 30 tablet, Rfl: 0   spironolactone (ALDACTONE) 25 MG tablet, Take 1 tablet (25 mg total) by mouth daily., Disp: 30 tablet, Rfl: 6   torsemide 40 MG  TABS, Take 40 mg by mouth 2 (two) times daily., Disp: 60 tablet, Rfl: 5   MEDICATION ADHERENCES TIPS AND STRATEGIES Taking medication as prescribed improves patient outcomes in heart failure (reduces hospitalizations, improves symptoms, increases survival) Side effects of medications can be managed by decreasing doses, switching agents, stopping drugs, or adding additional therapy. Please let someone in the Heart Failure Clinic know if you have having bothersome side effects so we can modify your regimen. Do not alter your medication regimen without talking to [place].  Medication reminders can help patients remember to take drugs on time. If you are missing or forgetting doses you can try linking behaviors, using pill boxes, or an electronic reminder like an alarm on your phone or an app. Some people can also get automated phone calls as medication reminders.

## 2022-01-29 NOTE — Telephone Encounter (Addendum)
Lab results and medication changes below from provider reviewed with patient. Patient verbalized understanding and was able to state back that he will not begin jardiance at this time. He will decrease torsemide to once daily, but still take an additional dose if needed for weight gain, swelling and shortness of breath. Verbalized understanding that we will recheck labs in 2 weeks at his next appt. Patient had no questions or concerns. Suanne Marker, RN ----- Message from Delma Freeze, FNP sent at 01/29/2022 12:18 PM EST ----- Kidney function has declined slightly from hospital discharge. Will not start jardiance at this time. Decrease your torsemide to once daily but take an additional dose if needed for weight gain, swelling or shortness of breath. Will recheck your labs at next visit.

## 2022-01-30 DIAGNOSIS — I10 Essential (primary) hypertension: Secondary | ICD-10-CM | POA: Diagnosis not present

## 2022-01-30 DIAGNOSIS — I2510 Atherosclerotic heart disease of native coronary artery without angina pectoris: Secondary | ICD-10-CM | POA: Diagnosis not present

## 2022-01-30 DIAGNOSIS — I255 Ischemic cardiomyopathy: Secondary | ICD-10-CM | POA: Diagnosis not present

## 2022-01-30 DIAGNOSIS — I5033 Acute on chronic diastolic (congestive) heart failure: Secondary | ICD-10-CM | POA: Diagnosis not present

## 2022-02-11 NOTE — Progress Notes (Signed)
[facility] Cardiology    SUBJECTIVE: Mild shortness of breath improved symptoms denies palpitations or tachycardia tolerating anticoagulation well no significant leg swelling no wheezing feels well enough to go home   Vitals:   01/08/22 0525 01/08/22 0541 01/08/22 0818 01/08/22 1242  BP: 109/87  113/76 115/86  Pulse: 76  95 79  Resp: 18  18 20   Temp: (!) 97.5 F (36.4 C)  (!) 97.5 F (36.4 C) 98.7 F (37.1 C)  TempSrc:      SpO2: 98%  95% 99%  Weight:  112.7 kg    Height:        No intake or output data in the 24 hours ending 02/11/22 0806    PHYSICAL EXAM  General: Well developed, well nourished, in no acute distress HEENT:  Normocephalic and atramatic Neck:  No JVD.  Lungs: Clear bilaterally to auscultation and percussion. Heart: Irregular irregular. Normal S1 and S2 without gallops or murmurs.  Abdomen: Bowel sounds are positive, abdomen soft and non-tender  Msk:  Back normal, normal gait. Normal strength and tone for age. Extremities: No clubbing, cyanosis or edema.   Neuro: Alert and oriented X 3. Psych:  Good affect, responds appropriately   LABS: Basic Metabolic Panel: No results for input(s): "NA", "K", "CL", "CO2", "GLUCOSE", "BUN", "CREATININE", "CALCIUM", "MG", "PHOS" in the last 72 hours. Liver Function Tests: No results for input(s): "AST", "ALT", "ALKPHOS", "BILITOT", "PROT", "ALBUMIN" in the last 72 hours. No results for input(s): "LIPASE", "AMYLASE" in the last 72 hours. CBC: No results for input(s): "WBC", "NEUTROABS", "HGB", "HCT", "MCV", "PLT" in the last 72 hours. Cardiac Enzymes: No results for input(s): "CKTOTAL", "CKMB", "CKMBINDEX", "TROPONINI" in the last 72 hours. BNP: Invalid input(s): "POCBNP" D-Dimer: No results for input(s): "DDIMER" in the last 72 hours. Hemoglobin A1C: No results for input(s): "HGBA1C" in the last 72 hours. Fasting Lipid Panel: No results for input(s): "CHOL", "HDL", "LDLCALC", "TRIG", "CHOLHDL", "LDLDIRECT" in the last  72 hours. Thyroid Function Tests: No results for input(s): "TSH", "T4TOTAL", "T3FREE", "THYROIDAB" in the last 72 hours.  Invalid input(s): "FREET3" Anemia Panel: No results for input(s): "VITAMINB12", "FOLATE", "FERRITIN", "TIBC", "IRON", "RETICCTPCT" in the last 72 hours.  No results found.   Echo as above ventricular function EF around 50%  TELEMETRY: Atrial fibrillation rate of around 90 nonspecific ST-T wave changes  ASSESSMENT AND PLAN:  Principal Problem:   CHF (congestive heart failure) (HCC) Active Problems:   CAD S/P percutaneous coronary angioplasty   Tobacco use disorder   Elevated troponin   Acute respiratory failure with hypoxia (HCC)   Atrial fibrillation, persistent (HCC)   Leukocytosis   Dysuria   Hypoxia   SOB (shortness of breath)    Plan Patient shortness of breath is very concerning with preserved left ventricular function Recommend discontinuing amiodarone in favor of a different antiarrhythmic if necessary Recommend advancing metoprolol for rate control continue Eliquis Consider pulmonary evaluation for patient's recent shortness of breath Consider lipid management with Repatha or Praluent since the patient states been unable to tolerate statins  02/13/22, MD 02/11/2022 8:06 AM

## 2022-02-12 ENCOUNTER — Ambulatory Visit (HOSPITAL_BASED_OUTPATIENT_CLINIC_OR_DEPARTMENT_OTHER): Payer: BC Managed Care – PPO | Admitting: Family

## 2022-02-12 ENCOUNTER — Encounter: Payer: Self-pay | Admitting: Family

## 2022-02-12 ENCOUNTER — Other Ambulatory Visit
Admission: RE | Admit: 2022-02-12 | Discharge: 2022-02-12 | Disposition: A | Discharge status: Home / Self Care | Payer: BC Managed Care – PPO | Source: Ambulatory Visit | Attending: Family | Admitting: Family

## 2022-02-12 VITALS — BP 128/67 | HR 71 | Resp 14 | Ht 72.0 in | Wt 254.1 lb

## 2022-02-12 DIAGNOSIS — I480 Paroxysmal atrial fibrillation: Secondary | ICD-10-CM

## 2022-02-12 DIAGNOSIS — Z87891 Personal history of nicotine dependence: Secondary | ICD-10-CM | POA: Insufficient documentation

## 2022-02-12 DIAGNOSIS — I5032 Chronic diastolic (congestive) heart failure: Secondary | ICD-10-CM

## 2022-02-12 DIAGNOSIS — R531 Weakness: Secondary | ICD-10-CM | POA: Insufficient documentation

## 2022-02-12 DIAGNOSIS — Z79899 Other long term (current) drug therapy: Secondary | ICD-10-CM | POA: Insufficient documentation

## 2022-02-12 DIAGNOSIS — I4891 Unspecified atrial fibrillation: Secondary | ICD-10-CM | POA: Insufficient documentation

## 2022-02-12 DIAGNOSIS — I2510 Atherosclerotic heart disease of native coronary artery without angina pectoris: Secondary | ICD-10-CM

## 2022-02-12 DIAGNOSIS — Z7901 Long term (current) use of anticoagulants: Secondary | ICD-10-CM | POA: Insufficient documentation

## 2022-02-12 DIAGNOSIS — E876 Hypokalemia: Secondary | ICD-10-CM | POA: Insufficient documentation

## 2022-02-12 DIAGNOSIS — Z955 Presence of coronary angioplasty implant and graft: Secondary | ICD-10-CM | POA: Insufficient documentation

## 2022-02-12 LAB — BASIC METABOLIC PANEL
Anion gap: 7 (ref 5–15)
BUN: 31 mg/dL — ABNORMAL HIGH (ref 6–20)
CO2: 27 mmol/L (ref 22–32)
Calcium: 9.7 mg/dL (ref 8.9–10.3)
Chloride: 103 mmol/L (ref 98–111)
Creatinine, Ser: 2.06 mg/dL — ABNORMAL HIGH (ref 0.61–1.24)
GFR, Estimated: 37 mL/min — ABNORMAL LOW (ref >60)
Glucose, Bld: 88 mg/dL (ref 70–99)
Potassium: 4.3 mmol/L (ref 3.5–5.1)
Sodium: 137 mmol/L (ref 135–145)

## 2022-02-12 LAB — MAGNESIUM: Magnesium: 2.4 mg/dL (ref 1.7–2.4)

## 2022-02-12 MED ORDER — APIXABAN 5 MG PO TABS: 5.0000 mg | ORAL_TABLET | Freq: Two times a day (BID) | ORAL | 5 refills | Status: DC

## 2022-02-12 MED ORDER — TORSEMIDE 40 MG PO TABS: 20.0000 mg | ORAL_TABLET | Freq: Every day | ORAL | 5 refills | Status: AC

## 2022-02-12 NOTE — Progress Notes (Signed)
Patient ID: Andres [name], male    DOB: 01-24-1966, 56 y.o.   MRN: [id number]  HPI  Andres [name] is a 56 y/o male with a history of CAD, atrial fibrillation, kidney stones, tobacco use and chronic heart failure.   TEE 01/13/22 showed EF 55-60%, moderate LAE, NO thrombus, moderate grade III plaque, can not exclude small PFO. Successful direct current cardioversion.  Echo report from 01/07/22 showed EF 65-70% with mild LAE and moderate Andres.   LHC and angiography done 01/06/22 and showed:   Prox RCA lesion is 20% stenosed.   Mid RCA lesion is 20% stenosed.   Dist RCA lesion is 40% stenosed.   RPAV lesion is 40% stenosed.   Prox Cx lesion is 50% stenosed.   2nd Mrg lesion is 50% stenosed.   1st Mrg lesion is 30% stenosed.   Prox LAD lesion is 50% stenosed.   1st Diag lesion is 60% stenosed.   Previously placed Prox LAD to Mid LAD stent of unknown type is  widely patent.   The left ventricular systolic function is normal.   The left ventricular ejection fraction is 50-55% by visual estimate.  1.  Diffuse moderate nonobstructive coronary artery disease with patent stent proximal/mid LAD, 50% stenosis proximal LAD, 50% stenosis proximal left circumflex, 40% distal RCA 2.  Normal left ventricular function with estimated LVEF 50-55%  Admitted 01/20/22 due to shortness of breath, weight gain and pedal edema. Initially given IV lasix with transition to or diuretics. Cardiology consult obtained. Discharged after 2 days. Was in the ED 01/19/22 due to acute on chronic HF where he was treated and released. Was in the ED 01/16/22 due to hypokalemia with a potassium of 2.5 reported by his PCP. EKG Rate: 54  Rhythm: normal EKG, normal sinus rhythm  Axis: nl Intervals:long qtc 600, no ischemic changes, min ST depressions in lateral leads. A re-check of his ECG shows resolution of prior QTC prolongation and lateral STD. IV/PO potassium given and he was released. Admitted 01/10/22 due to SOB with AF RVR.  Cardiology consult obtained. Started on diltiazem gtt which was stopped after 1 day. Echo done with successful cardioversion. CXR showed pulmonary edema. Given metolazone and IV lasix. Transitioned to oral lasix with potassium. Discharged after 3 days. Admitted 01/05/22 due to SOB/ chest pain. Needed 5L oxygen due to hypoxia. CXR showed pulmonary edema and lasix started. Cardiology consult obtained. Patient underwent left heart catheterization on 01/06/2022 which shows Diffuse moderate nonobstructive coronary artery disease with patent stent proximal/mid LAD, 50% stenosis proximal LAD, 50% stenosis proximal left circumflex, 40% distal RCA.  Widely patent previously placed stent in LAD. Short runs of NSVT & intermittent AF/RVR and metoprolol increased. Weaned off oxygen. Discharged after 3 days.   He presents today for a follow-up visit with a chief complaint of minimal fatigue upon moderate exertion. Describes this as chronic in nature although has been improving. He has associated anxiety and slight weight gain along with this. He denies any dizziness, difficulty sleeping, abdominal distention, palpitations, pedal edema, chest pain, wheezing, shortness of breath or cough.   He does mention that he gets lightheaded if he looks up or bends over and then back up. He also noticed if he sits for long periods, he will have leg cramping, weakness upon standing. Lasts for a few minutes and then resolves.   Ran out of eliquis last week and needs a refill sent in.   Past Medical History:  Diagnosis Date   A-fib (HCC)  CHF (congestive heart failure) (HCC)    Coronary artery disease    History of kidney stones    Past Surgical History:  Procedure Laterality Date   CARDIOVERSION N/A 01/13/2022   Procedure: CARDIOVERSION;  Surgeon: Lamar Blinks, MD;  Location: [facility] ORS;  Service: Cardiovascular;  Laterality: N/A;   LEFT HEART CATH AND CORONARY ANGIOGRAPHY N/A 01/06/2022   Procedure: LEFT HEART CATH AND  CORONARY ANGIOGRAPHY possible PCI and stent;  Surgeon: Marcina Millard, MD;  Location: [facility] INVASIVE CV LAB;  Service: Cardiovascular;  Laterality: N/A;   TEE WITHOUT CARDIOVERSION N/A 01/13/2022   Procedure: TRANSESOPHAGEAL ECHOCARDIOGRAM (TEE);  Surgeon: Lamar Blinks, MD;  Location: [facility] ORS;  Service: Cardiovascular;  Laterality: N/A;   No family history on file. Social History   Tobacco Use   Smoking status: Former    Packs/day: 1.00    Types: Cigarettes    Quit date: 01/04/2022    Years since quitting: 0.1   Smokeless tobacco: Not on file  Substance Use Topics   Alcohol use: Not Currently   Allergies  Allergen Reactions   Statins Other (See Comments)    Myalgias, cannot remember which statin he took in the past but willing to try atorvastatin 12/2021   Prior to Admission medications   Medication Sig Start Date End Date Taking? Authorizing Provider  amiodarone (PACERONE) 200 MG tablet Take 0.5 tablets (100 mg total) by mouth daily. Until Friday, then start taking once daily 01/22/22 02/21/22 Yes Callwood, Dwayne D, MD  aspirin EC 81 MG tablet Take 1 tablet (81 mg total) by mouth daily. Swallow whole. 01/05/22  Yes Lynn Ito, MD  atorvastatin (LIPITOR) 40 MG tablet Take 1 tablet (40 mg total) by mouth every evening. 01/08/22  Yes Arnetha Courser, MD  Cyanocobalamin 1000 MCG TBCR Take 1 tablet by mouth daily. 02/06/20  Yes [provider]  lisinopril (ZESTRIL) 5 MG tablet Take 1 tablet (5 mg total) by mouth daily. 01/29/22  Yes Clarisa Kindred A, FNP  metoprolol tartrate (LOPRESSOR) 25 MG tablet Take 12.5 mg by mouth 2 (two) times daily. 01/22/22 01/22/23 Yes [provider]  nitroGLYCERIN (NITROSTAT) 0.4 MG SL tablet  01/20/19  Yes [provider]  potassium chloride SA (KLOR-CON M) 20 MEQ tablet Take 1 tablet (20 mEq total) by mouth daily. 01/13/22  Yes Enedina Finner, MD  spironolactone (ALDACTONE) 25 MG tablet Take 1 tablet (25 mg total) by mouth  daily. 01/23/22  Yes Callwood, Dwayne D, MD  apixaban (ELIQUIS) 5 MG TABS tablet Take 1 tablet (5 mg total) by mouth 2 (two) times daily. 02/12/22   Clarisa Kindred A, FNP  Torsemide 40 MG TABS Take 40 mg by mouth daily. And additional 40mg  if needed for weight gain, swelling 02/12/22   02/14/22, FNP   Review of Systems  Constitutional:  Positive for fatigue (improving). Negative for appetite change.  HENT:  Negative for congestion, postnasal drip and sore throat.   Eyes: Negative.   Respiratory:  Negative for cough, chest tightness, shortness of breath and wheezing.   Cardiovascular:  Negative for chest pain, palpitations and leg swelling.  Gastrointestinal:  Negative for abdominal distention and abdominal pain.  Endocrine: Negative.   Genitourinary: Negative.   Musculoskeletal:  Negative for back pain and neck pain.  Allergic/Immunologic: Negative.   Neurological:  Negative for dizziness and light-headedness.  Hematological:  Negative for adenopathy. Does not bruise/bleed easily.  Psychiatric/Behavioral:  Negative for dysphoric mood and sleep disturbance (sleeping on 1 pillow with  HOB elevated). The patient is nervous/anxious.    Vitals:   02/12/22 1413  BP: 128/67  Pulse: 71  Resp: 14  SpO2: 100%  Weight: 254 lb 2 oz (115.3 kg)  Height: 6' (1.829 m)   Wt Readings from Last 3 Encounters:  02/12/22 254 lb 2 oz (115.3 kg)  01/29/22 249 lb 6 oz (113.1 kg)  01/22/22 247 lb 9.2 oz (112.3 kg)   Lab Results  Component Value Date   CREATININE 2.06 (H) 02/12/2022   CREATININE 1.73 (H) 01/29/2022   CREATININE 1.34 (H) 01/22/2022   Physical Exam Vitals and nursing note reviewed.  Constitutional:      Appearance: Normal appearance.  HENT:     Head: Normocephalic and atraumatic.  Cardiovascular:     Rate and Rhythm: Normal rate and regular rhythm.  Pulmonary:     Effort: Pulmonary effort is normal. No respiratory distress.     Breath sounds: No wheezing or rales.   Abdominal:     General: There is no distension.     Palpations: Abdomen is soft.     Tenderness: There is no abdominal tenderness.  Musculoskeletal:        General: No tenderness.     Cervical back: Normal range of motion and neck supple.     Right lower leg: No edema.     Left lower leg: No edema.  Skin:    General: Skin is warm and dry.  Neurological:     General: No focal deficit present.     Mental Status: He is alert and oriented to person, place, and time.  Psychiatric:        Mood and Affect: Mood normal.        Behavior: Behavior normal.        Thought Content: Thought content normal.   Assessment & Plan:  1: Chronic heart failure with preserved ejection fraction with structural changes (LAE)- - NYHA class II - euvolemic today - weighing daily; instructed to call for an overnight weight gain of > 2 pounds or a weekly weight gain of > 5 pounds - weight up 5 pounds from last visit here 2 weeks ago - on GDMT of lisinopril - lisinopril increased at last visit; check BMP today - BNP 01/20/22 was 937.3  2: Atrial fibrillation- - currently on amiodarone, metoprolol and apixaban - saw cardiology Cliffton Asters) 01/30/22 - refilled eliquis today  3: Hypokalemia- - saw PCP @ [facility]  01/15/22 - BMP 01/29/22 reviewed and showed sodium 138, potassium 3.9, creatinine 1.73 and GFR 45 - repeat BMP today - will also check magnesium since he's having some leg cramps/weakness  4: CAD- - previous stent placement in LAD in 2013 - will discuss rehab next visit if cardiology doesn't address this - says that he used to take something for his cholesterol that didn't make his bones hurt like the atorvastatin does; encouraged him to discuss this with cardiology in 2 weeks   Medication list reviewed.   Return in 2 months, sooner if needed.

## 2022-02-12 NOTE — Patient Instructions (Addendum)
Continue weighing daily and call for an overnight weight gain of 3 pounds or more or a weekly weight gain of more than 5 pounds.   If you have voicemail, please make sure your mailbox is cleaned out so that we may leave a message and please make sure to listen to any voicemails.     Call [facility] at [phone] and for the Internal Medicine department so you can get a new patient appointment scheduled

## 2022-02-14 ENCOUNTER — Other Ambulatory Visit: Payer: Self-pay | Admitting: Family

## 2022-02-14 MED ORDER — POTASSIUM CHLORIDE CRYS ER 20 MEQ PO TBCR: 20.0000 meq | EXTENDED_RELEASE_TABLET | Freq: Every day | ORAL | 3 refills | Status: DC

## 2022-02-14 NOTE — Progress Notes (Signed)
Potassium RX sent to pharmacy °

## 2022-02-27 DIAGNOSIS — I2510 Atherosclerotic heart disease of native coronary artery without angina pectoris: Secondary | ICD-10-CM | POA: Diagnosis not present

## 2022-02-27 DIAGNOSIS — I10 Essential (primary) hypertension: Secondary | ICD-10-CM | POA: Diagnosis not present

## 2022-02-27 DIAGNOSIS — I5033 Acute on chronic diastolic (congestive) heart failure: Secondary | ICD-10-CM | POA: Diagnosis not present

## 2022-02-27 DIAGNOSIS — I255 Ischemic cardiomyopathy: Secondary | ICD-10-CM | POA: Diagnosis not present

## 2022-04-01 DIAGNOSIS — K602 Anal fissure, unspecified: Secondary | ICD-10-CM | POA: Diagnosis not present

## 2022-04-01 DIAGNOSIS — E876 Hypokalemia: Secondary | ICD-10-CM | POA: Diagnosis not present

## 2022-04-01 DIAGNOSIS — Z125 Encounter for screening for malignant neoplasm of prostate: Secondary | ICD-10-CM | POA: Diagnosis not present

## 2022-04-01 DIAGNOSIS — K6289 Other specified diseases of anus and rectum: Secondary | ICD-10-CM | POA: Diagnosis not present

## 2022-04-01 DIAGNOSIS — I2510 Atherosclerotic heart disease of native coronary artery without angina pectoris: Secondary | ICD-10-CM | POA: Diagnosis not present

## 2022-04-01 DIAGNOSIS — E7801 Familial hypercholesterolemia: Secondary | ICD-10-CM | POA: Diagnosis not present

## 2022-04-01 DIAGNOSIS — K5900 Constipation, unspecified: Secondary | ICD-10-CM | POA: Diagnosis not present

## 2022-04-01 DIAGNOSIS — I480 Paroxysmal atrial fibrillation: Secondary | ICD-10-CM | POA: Diagnosis not present

## 2022-04-01 DIAGNOSIS — R7989 Other specified abnormal findings of blood chemistry: Secondary | ICD-10-CM | POA: Diagnosis not present

## 2022-04-13 NOTE — Progress Notes (Signed)
Patient ID: Andres [name], male    DOB: 20-Aug-1965, 57 y.o.   MRN: [id number]  HPI  Mr Andres [name] is a 57 y/o male with a history of CAD, atrial fibrillation, kidney stones, tobacco use and chronic heart failure.   TEE 01/13/22 showed EF 55-60%, moderate LAE, NO thrombus, moderate grade III plaque, can not exclude small PFO. Successful direct current cardioversion.  Echo report from 01/07/22 showed EF 65-70% with mild LAE and moderate MR.   LHC and angiography done 01/06/22 and showed:   Prox RCA lesion is 20% stenosed.   Mid RCA lesion is 20% stenosed.   Dist RCA lesion is 40% stenosed.   RPAV lesion is 40% stenosed.   Prox Cx lesion is 50% stenosed.   2nd Mrg lesion is 50% stenosed.   1st Mrg lesion is 30% stenosed.   Prox LAD lesion is 50% stenosed.   1st Diag lesion is 60% stenosed.   Previously placed Prox LAD to Mid LAD stent of unknown type is  widely patent.   The left ventricular systolic function is normal.   The left ventricular ejection fraction is 50-55% by visual estimate.  1.  Diffuse moderate nonobstructive coronary artery disease with patent stent proximal/mid LAD, 50% stenosis proximal LAD, 50% stenosis proximal left circumflex, 40% distal RCA 2.  Normal left ventricular function with estimated LVEF 50-55%  Admitted 01/20/22 due to shortness of breath, weight gain and pedal edema. Initially given IV lasix with transition to or diuretics. Cardiology consult obtained. Discharged after 2 days. Was in the ED 01/19/22 due to acute on chronic HF where he was treated and released. Was in the ED 01/16/22 due to hypokalemia with a potassium of 2.5 reported by his PCP. EKG Rate: 54  Rhythm: normal EKG, normal sinus rhythm  Axis: nl Intervals:long qtc 600, no ischemic changes, min ST depressions in lateral leads. A re-check of his ECG shows resolution of prior QTC prolongation and lateral STD. IV/PO potassium given and he was released. Admitted 01/10/22 due to SOB with AF RVR.  Cardiology consult obtained. Started on diltiazem gtt which was stopped after 1 day. Echo done with successful cardioversion. CXR showed pulmonary edema. Given metolazone and IV lasix. Transitioned to oral lasix with potassium. Discharged after 3 days. Admitted 01/05/22 due to SOB/ chest pain. Needed 5L oxygen due to hypoxia. CXR showed pulmonary edema and lasix started. Cardiology consult obtained. Patient underwent left heart catheterization on 01/06/2022 which shows Diffuse moderate nonobstructive coronary artery disease with patent stent proximal/mid LAD, 50% stenosis proximal LAD, 50% stenosis proximal left circumflex, 40% distal RCA.  Widely patent previously placed stent in LAD. Short runs of NSVT & intermittent AF/RVR and metoprolol increased. Weaned off oxygen. Discharged after 3 days.   He presents today for a follow-up visit with a chief complaint of minimal fatigue with moderate exertion. Describes this as chronic in nature. Has associated cough, bilateral leg cramping, anxiety and gradual weight gain along with this. Denies any difficulty sleeping, dizziness, abdominal distention, palpitations, pedal edema, chest pain, wheezing or shortness of breath.   Says that his cardiologist is supposed to be discussing a sleep study with him at his f/u visit next week.   Past Medical History:  Diagnosis Date   A-fib [facility])    CHF (congestive heart failure) ([place])    Coronary artery disease    History of kidney stones    Past Surgical History:  Procedure Laterality Date   CARDIOVERSION N/A 01/13/2022   Procedure: CARDIOVERSION;  Surgeon: Corey Skains, MD;  Location: [facility] ORS;  Service: Cardiovascular;  Laterality: N/A;   LEFT HEART CATH AND CORONARY ANGIOGRAPHY N/A 01/06/2022   Procedure: LEFT HEART CATH AND CORONARY ANGIOGRAPHY possible PCI and stent;  Surgeon: Isaias Cowman, MD;  Location: [facility];  Service: Cardiovascular;  Laterality: N/A;   TEE WITHOUT CARDIOVERSION  N/A 01/13/2022   Procedure: TRANSESOPHAGEAL ECHOCARDIOGRAM (TEE);  Surgeon: Corey Skains, MD;  Location: [facility] ORS;  Service: Cardiovascular;  Laterality: N/A;   No family history on file. Social History   Tobacco Use   Smoking status: Former    Packs/day: 1.00    Types: Cigarettes    Quit date: 01/04/2022    Years since quitting: 0.2   Smokeless tobacco: Not on file  Substance Use Topics   Alcohol use: Not Currently   Allergies  Allergen Reactions   Statins Other (See Comments)    Myalgias, cannot remember which statin he took in the past but willing to try atorvastatin 12/2021   Prior to Admission medications   Medication Sig Start Date End Date Taking? Authorizing Provider  apixaban (ELIQUIS) 5 MG TABS tablet Take 1 tablet (5 mg total) by mouth 2 (two) times daily. 02/12/22  Yes Darylene Price A, FNP  aspirin EC 81 MG tablet Take 1 tablet (81 mg total) by mouth daily. Swallow whole. 01/05/22  Yes Nolberto Hanlon, MD  Cyanocobalamin 1000 MCG TBCR Take 1 tablet by mouth daily. 02/06/20  Yes [provider]  lisinopril (ZESTRIL) 5 MG tablet Take 1 tablet (5 mg total) by mouth daily. 01/29/22  Yes Darylene Price A, FNP  metoprolol tartrate (LOPRESSOR) 25 MG tablet Take 12.5 mg by mouth 2 (two) times daily. 01/22/22 01/22/23 Yes [provider]  nitroGLYCERIN (NITROSTAT) 0.4 MG SL tablet  01/20/19  Yes [provider]  potassium chloride SA (KLOR-CON M) 20 MEQ tablet Take 1 tablet (20 mEq total) by mouth daily. 02/14/22  Yes Jamyra Zweig, Otila Kluver A, FNP  spironolactone (ALDACTONE) 25 MG tablet Take 1 tablet (25 mg total) by mouth daily. 01/23/22  Yes Callwood, Dwayne D, MD  Torsemide 40 MG TABS Take 20 mg by mouth daily. And additional 20mg  if needed for weight gain, swelling 02/12/22  Yes Darylene Price A, FNP  amiodarone (PACERONE) 200 MG tablet Take 0.5 tablets (100 mg total) by mouth daily. 04/14/22   Alisa Graff, FNP  atorvastatin (LIPITOR) 40 MG tablet Take 1  tablet (40 mg total) by mouth every evening. 04/14/22   Alisa Graff, FNP    Review of Systems  Constitutional:  Positive for fatigue. Negative for appetite change.  HENT:  Positive for postnasal drip. Negative for congestion and sore throat.   Eyes: Negative.   Respiratory:  Positive for cough. Negative for chest tightness, shortness of breath and wheezing.        + snoring  Cardiovascular:  Negative for chest pain, palpitations and leg swelling.  Gastrointestinal:  Negative for abdominal distention and abdominal pain.  Endocrine: Negative.   Genitourinary: Negative.   Musculoskeletal:  Positive for arthralgias (bilateral leg cramping). Negative for back pain and neck pain.  Allergic/Immunologic: Negative.   Neurological:  Negative for dizziness and light-headedness.  Hematological:  Negative for adenopathy. Does not bruise/bleed easily.  Psychiatric/Behavioral:  Negative for dysphoric mood and sleep disturbance (sleeping on 1 pillow with HOB elevated). The patient is nervous/anxious.    Vitals:   04/14/22 1348  BP: 134/71  Pulse: 66  SpO2: 99%  Weight:  260 lb 9.6 oz (118.2 kg)   Wt Readings from Last 3 Encounters:  04/14/22 260 lb 9.6 oz (118.2 kg)  02/12/22 254 lb 2 oz (115.3 kg)  01/29/22 249 lb 6 oz (113.1 kg)   Lab Results  Component Value Date   CREATININE 2.06 (H) 02/12/2022   CREATININE 1.73 (H) 01/29/2022   CREATININE 1.34 (H) 01/22/2022   Physical Exam Vitals and nursing note reviewed.  Constitutional:      Appearance: Normal appearance.  HENT:     Head: Normocephalic and atraumatic.  Cardiovascular:     Rate and Rhythm: Normal rate and regular rhythm.  Pulmonary:     Effort: Pulmonary effort is normal. No respiratory distress.     Breath sounds: No wheezing or rales.  Abdominal:     General: There is no distension.     Palpations: Abdomen is soft.     Tenderness: There is no abdominal tenderness.  Musculoskeletal:        General: No tenderness.      Cervical back: Normal range of motion and neck supple.     Right lower leg: No edema.     Left lower leg: No edema.  Skin:    General: Skin is warm and dry.  Neurological:     General: No focal deficit present.     Mental Status: He is alert and oriented to person, place, and time.  Psychiatric:        Mood and Affect: Mood normal.        Behavior: Behavior normal.        Thought Content: Thought content normal.   Assessment & Plan:  1: Chronic heart failure with preserved ejection fraction with structural changes (LAE)- - NYHA class II - euvolemic today - weighing daily; reminded to call for an overnight weight gain of > 2 pounds or a weekly weight gain of > 5 pounds - weight up 6 pounds from last visit here 2 months ago - on GDMT of lisinopril - not terribly interested in SGLT2 as he says that he already takes "a lot" of meds - discussed ordering sleep study but he wants to discuss w/ cardiology next month as he says that they already mentioned this to him - cardiac research nurse discussed trial w/ him and he took consent home for his wife to [place] - will try torsemide PRN for above weight gain or swelling instead of taking it daily - BNP 01/20/22 was 937.3  2: Atrial fibrillation- - currently on amiodarone, metoprolol and apixaban - saw cardiology Cliffton Asters) 02/27/22  3: Hypokalemia- - saw PCP @ [facility] 04/01/22 - BMP 04/01/22 reviewed and showed sodium 136, potassium 4.3, creatinine 1.9 and GFR 41  4: CAD- - previous stent placement in LAD in 2013 - discussed pulmonary rehab but he says that he's too busy working to commit 2-3 times/ week   Medication list reviewed.   Return in 6 weeks, sooner if needed.

## 2022-04-14 ENCOUNTER — Encounter: Payer: Self-pay | Admitting: Family

## 2022-04-14 ENCOUNTER — Ambulatory Visit: Payer: BC Managed Care – PPO | Attending: Family | Admitting: Family

## 2022-04-14 VITALS — BP 134/71 | HR 66 | Wt 260.6 lb

## 2022-04-14 DIAGNOSIS — I472 Ventricular tachycardia, unspecified: Secondary | ICD-10-CM | POA: Diagnosis not present

## 2022-04-14 DIAGNOSIS — I2510 Atherosclerotic heart disease of native coronary artery without angina pectoris: Secondary | ICD-10-CM

## 2022-04-14 DIAGNOSIS — F419 Anxiety disorder, unspecified: Secondary | ICD-10-CM | POA: Diagnosis not present

## 2022-04-14 DIAGNOSIS — Z87891 Personal history of nicotine dependence: Secondary | ICD-10-CM | POA: Insufficient documentation

## 2022-04-14 DIAGNOSIS — I480 Paroxysmal atrial fibrillation: Secondary | ICD-10-CM

## 2022-04-14 DIAGNOSIS — I5032 Chronic diastolic (congestive) heart failure: Secondary | ICD-10-CM | POA: Diagnosis not present

## 2022-04-14 DIAGNOSIS — R059 Cough, unspecified: Secondary | ICD-10-CM | POA: Diagnosis not present

## 2022-04-14 DIAGNOSIS — R5383 Other fatigue: Secondary | ICD-10-CM | POA: Insufficient documentation

## 2022-04-14 DIAGNOSIS — Z955 Presence of coronary angioplasty implant and graft: Secondary | ICD-10-CM | POA: Insufficient documentation

## 2022-04-14 DIAGNOSIS — E876 Hypokalemia: Secondary | ICD-10-CM | POA: Insufficient documentation

## 2022-04-14 DIAGNOSIS — Z7901 Long term (current) use of anticoagulants: Secondary | ICD-10-CM | POA: Insufficient documentation

## 2022-04-14 MED ORDER — AMIODARONE HCL 200 MG PO TABS: 100.0000 mg | ORAL_TABLET | Freq: Every day | ORAL | 5 refills | Status: DC

## 2022-04-14 MED ORDER — ATORVASTATIN CALCIUM 40 MG PO TABS: 40.0000 mg | ORAL_TABLET | Freq: Every evening | ORAL | 5 refills | Status: DC

## 2022-04-14 NOTE — Patient Instructions (Addendum)
Continue weighing daily and call for an overnight weight gain of 3 pounds or more or a weekly weight gain of more than 5 pounds.   If you have voicemail, please make sure your mailbox is cleaned out so that we may leave a message and please make sure to listen to any voicemails.    Hold your torsemide for a few days and see if you have any above weight gain or swelling in your legs. If you don't, you can continue to use your torsemide if you need it. If you do have above weight gain or swelling, resume taking it as 1 tablet daily

## 2022-04-25 ENCOUNTER — Other Ambulatory Visit: Payer: Self-pay | Admitting: Family

## 2022-06-03 DIAGNOSIS — R0989 Other specified symptoms and signs involving the circulatory and respiratory systems: Secondary | ICD-10-CM | POA: Diagnosis not present

## 2022-06-03 DIAGNOSIS — K219 Gastro-esophageal reflux disease without esophagitis: Secondary | ICD-10-CM | POA: Diagnosis not present

## 2022-06-03 DIAGNOSIS — Z1331 Encounter for screening for depression: Secondary | ICD-10-CM | POA: Diagnosis not present

## 2022-06-03 DIAGNOSIS — Z0000 Encounter for general adult medical examination without abnormal findings: Secondary | ICD-10-CM | POA: Diagnosis not present

## 2022-06-03 DIAGNOSIS — I5032 Chronic diastolic (congestive) heart failure: Secondary | ICD-10-CM | POA: Diagnosis not present

## 2022-06-12 DIAGNOSIS — L400 Psoriasis vulgaris: Secondary | ICD-10-CM | POA: Diagnosis not present

## 2022-06-17 ENCOUNTER — Encounter: Payer: Self-pay | Admitting: Family

## 2022-06-17 ENCOUNTER — Ambulatory Visit: Payer: BC Managed Care – PPO | Attending: Family | Admitting: Family

## 2022-06-17 VITALS — BP 147/70 | HR 65 | Wt 260.0 lb

## 2022-06-17 DIAGNOSIS — R5383 Other fatigue: Secondary | ICD-10-CM | POA: Diagnosis not present

## 2022-06-17 DIAGNOSIS — Z79899 Other long term (current) drug therapy: Secondary | ICD-10-CM | POA: Diagnosis not present

## 2022-06-17 DIAGNOSIS — R059 Cough, unspecified: Secondary | ICD-10-CM | POA: Insufficient documentation

## 2022-06-17 DIAGNOSIS — I480 Paroxysmal atrial fibrillation: Secondary | ICD-10-CM | POA: Diagnosis not present

## 2022-06-17 DIAGNOSIS — E876 Hypokalemia: Secondary | ICD-10-CM | POA: Insufficient documentation

## 2022-06-17 DIAGNOSIS — Z87891 Personal history of nicotine dependence: Secondary | ICD-10-CM | POA: Diagnosis not present

## 2022-06-17 DIAGNOSIS — I5032 Chronic diastolic (congestive) heart failure: Secondary | ICD-10-CM | POA: Diagnosis not present

## 2022-06-17 DIAGNOSIS — I2510 Atherosclerotic heart disease of native coronary artery without angina pectoris: Secondary | ICD-10-CM | POA: Insufficient documentation

## 2022-06-17 DIAGNOSIS — Z7901 Long term (current) use of anticoagulants: Secondary | ICD-10-CM | POA: Diagnosis not present

## 2022-06-17 DIAGNOSIS — Z955 Presence of coronary angioplasty implant and graft: Secondary | ICD-10-CM | POA: Insufficient documentation

## 2022-06-17 DIAGNOSIS — G8929 Other chronic pain: Secondary | ICD-10-CM | POA: Diagnosis not present

## 2022-06-17 MED ORDER — SACUBITRIL-VALSARTAN 24-26 MG PO TABS: 1.0000 | ORAL_TABLET | Freq: Two times a day (BID) | ORAL | 3 refills | Status: DC

## 2022-06-17 NOTE — Progress Notes (Signed)
Patient ID: Andres [name], male    DOB: November 07, 1965, 57 y.o.   MRN: [id number]  HPI  Mr Blankley is a 57 y/o male with a history of CAD, atrial fibrillation, kidney stones, tobacco use and chronic heart failure.   TEE 01/13/22: EF 55-60%, moderate LAE, NO thrombus, moderate grade III plaque, can not exclude small PFO. Successful direct current cardioversion.  Echo 01/07/22: EF 65-70% with mild LAE and moderate MR.   LHC and angiography 01/06/22:   Prox RCA lesion is 20% stenosed.   Mid RCA lesion is 20% stenosed.   Dist RCA lesion is 40% stenosed.   RPAV lesion is 40% stenosed.   Prox Cx lesion is 50% stenosed.   2nd Mrg lesion is 50% stenosed.   1st Mrg lesion is 30% stenosed.   Prox LAD lesion is 50% stenosed.   1st Diag lesion is 60% stenosed.   Previously placed Prox LAD to Mid LAD stent of unknown type is  widely patent.   The left ventricular systolic function is normal.   The left ventricular ejection fraction is 50-55% by visual estimate.  1.  Diffuse moderate nonobstructive coronary artery disease with patent stent proximal/mid LAD, 50% stenosis proximal LAD, 50% stenosis proximal left circumflex, 40% distal RCA 2.  Normal left ventricular function with estimated LVEF 50-55%  Admitted 01/20/22 due to shortness of breath, weight gain and pedal edema. Initially given IV lasix with transition to or diuretics. Cardiology consult obtained. Discharged after 2 days. Was in the ED 01/19/22 due to acute on chronic HF where he was treated and released. Was in the ED 01/16/22 due to hypokalemia with a potassium of 2.5 reported by his PCP. EKG Rate: 54  Rhythm: normal EKG, normal sinus rhythm  Axis: nl Intervals:long qtc 600, no ischemic changes, min ST depressions in lateral leads. A re-check of his ECG shows resolution of prior QTC prolongation and lateral STD. IV/PO potassium given and he was released. Admitted 01/10/22 due to SOB with AF RVR. Cardiology consult obtained. Started on  diltiazem gtt which was stopped after 1 day. Echo done with successful cardioversion. CXR showed pulmonary edema. Given metolazone and IV lasix. Transitioned to oral lasix with potassium. Discharged after 3 days. Admitted 01/05/22 due to SOB/ chest pain. Needed 5L oxygen due to hypoxia. CXR showed pulmonary edema and lasix started. Cardiology consult obtained. Patient underwent left heart catheterization on 01/06/2022 which shows Diffuse moderate nonobstructive coronary artery disease with patent stent proximal/mid LAD, 50% stenosis proximal LAD, 50% stenosis proximal left circumflex, 40% distal RCA.  Widely patent previously placed stent in LAD. Short runs of NSVT & intermittent AF/RVR and metoprolol increased. Weaned off oxygen. Discharged after 3 days.   He presents today for a HF follow-up visit with a chief complaint of minimal fatigue with moderate exertion. Chronic in nature. Has associated dry cough and chronic pain along with this. Denies dizziness, difficulty sleeping, abdominal distention, palpitations, pedal edema, chest pain, SOB or weight gain.   His biggest complaint is of this frequent dry cough and is persistent.   Past Medical History:  Diagnosis Date   A-fib [facility])    CHF (congestive heart failure) ([place])    Coronary artery disease    History of kidney stones    Past Surgical History:  Procedure Laterality Date   CARDIOVERSION N/A 01/13/2022   Procedure: CARDIOVERSION;  Surgeon: Corey Skains, MD;  Location: [facility] ORS;  Service: Cardiovascular;  Laterality: N/A;   LEFT HEART CATH AND CORONARY  ANGIOGRAPHY N/A 01/06/2022   Procedure: LEFT HEART CATH AND CORONARY ANGIOGRAPHY possible PCI and stent;  Surgeon: Isaias Cowman, MD;  Location: [place];  Service: Cardiovascular;  Laterality: N/A;   TEE WITHOUT CARDIOVERSION N/A 01/13/2022   Procedure: TRANSESOPHAGEAL ECHOCARDIOGRAM (TEE);  Surgeon: Corey Skains, MD;  Location: [facility] ORS;  Service: Cardiovascular;   Laterality: N/A;   No family history on file. Social History   Tobacco Use   Smoking status: Former    Packs/day: 1    Types: Cigarettes    Quit date: 01/04/2022    Years since quitting: 0.4   Smokeless tobacco: Not on file  Substance Use Topics   Alcohol use: Not Currently   Allergies  Allergen Reactions   Statins Other (See Comments)    Myalgias, cannot remember which statin he took in the past but willing to try atorvastatin 12/2021   Prior to Admission medications   Medication Sig Start Date End Date Taking? Authorizing Provider  amiodarone (PACERONE) 200 MG tablet Take 0.5 tablets (100 mg total) by mouth daily. 04/14/22  Yes Alisa Graff, FNP  apixaban (ELIQUIS) 5 MG TABS tablet Take 1 tablet (5 mg total) by mouth 2 (two) times daily. 02/12/22  Yes Darylene Price A, FNP  aspirin EC 81 MG tablet Take 1 tablet (81 mg total) by mouth daily. Swallow whole. 01/05/22  Yes Nolberto Hanlon, MD  atorvastatin (LIPITOR) 40 MG tablet Take 1 tablet (40 mg total) by mouth every evening. 04/14/22  Yes Honour Schwieger A, FNP  Cyanocobalamin 1000 MCG TBCR Take 1 tablet by mouth daily. 02/06/20  Yes [provider]  lisinopril (ZESTRIL) 5 MG tablet TAKE 1 TABLET BY MOUTH ONCE DAILY 04/25/22  Yes Darylene Price A, FNP  metoprolol tartrate (LOPRESSOR) 25 MG tablet Take 12.5 mg by mouth 2 (two) times daily. 01/22/22 01/22/23 Yes [provider]  potassium chloride SA (KLOR-CON M) 20 MEQ tablet Take 1 tablet (20 mEq total) by mouth daily. 02/14/22  Yes Jodene Polyak, Otila Kluver A, FNP  spironolactone (ALDACTONE) 25 MG tablet Take 1 tablet (25 mg total) by mouth daily. 01/23/22  Yes Callwood, Dwayne D, MD  Torsemide 40 MG TABS Take 20 mg by mouth daily. And additional 20mg  if needed for weight gain, swelling 02/12/22  Yes Darylene Price A, FNP  nitroGLYCERIN (NITROSTAT) 0.4 MG SL tablet  01/20/19   [provider]     Review of Systems  Constitutional:  Positive for fatigue. Negative for  appetite change.  HENT:  Positive for postnasal drip. Negative for congestion and sore throat.   Eyes: Negative.   Respiratory:  Positive for cough (dry). Negative for chest tightness, shortness of breath and wheezing.        + snoring  Cardiovascular:  Negative for chest pain, palpitations and leg swelling.  Gastrointestinal:  Negative for abdominal distention and abdominal pain.  Endocrine: Negative.   Genitourinary: Negative.   Musculoskeletal:  Positive for arthralgias (bilateral leg cramping at times) and back pain (left back). Negative for neck pain.  Allergic/Immunologic: Negative.   Neurological:  Negative for dizziness and light-headedness.  Hematological:  Negative for adenopathy. Does not bruise/bleed easily.  Psychiatric/Behavioral:  Negative for dysphoric mood and sleep disturbance (sleeping on 1 pillow with HOB elevated). The patient is nervous/anxious.    Vitals:   06/17/22 1449  BP: (!) 147/70  Pulse: 65  SpO2: 98%  Weight: 260 lb (117.9 kg)   Wt Readings from Last 3 Encounters:  06/17/22 260 lb (117.9  kg)  04/14/22 260 lb 9.6 oz (118.2 kg)  02/12/22 254 lb 2 oz (115.3 kg)   Lab Results  Component Value Date   CREATININE 2.06 (H) 02/12/2022   CREATININE 1.73 (H) 01/29/2022   CREATININE 1.34 (H) 01/22/2022   Physical Exam Vitals and nursing note reviewed.  Constitutional:      Appearance: Normal appearance.  HENT:     Head: Normocephalic and atraumatic.  Cardiovascular:     Rate and Rhythm: Normal rate and regular rhythm.  Pulmonary:     Effort: Pulmonary effort is normal. No respiratory distress.     Breath sounds: No wheezing or rales.  Abdominal:     General: There is no distension.     Palpations: Abdomen is soft.     Tenderness: There is no abdominal tenderness.  Musculoskeletal:        General: No tenderness.     Cervical back: Normal range of motion and neck supple.     Right lower leg: No edema.     Left lower leg: No edema.  Skin:     General: Skin is warm and dry.  Neurological:     General: No focal deficit present.     Mental Status: He is alert and oriented to person, place, and time.  Psychiatric:        Mood and Affect: Mood normal.        Behavior: Behavior normal.        Thought Content: Thought content normal.   Assessment & Plan:  1: Chronic heart failure with preserved ejection fraction with structural changes (LAE)- - NYHA class II - euvolemic today - weighing daily; reminded to call for an overnight weight gain of > 2 pounds or a weekly weight gain of > 5 pounds - weight unchanged from last visit here 2 months ago - TEE 01/13/22: EF 55-60%, moderate LAE, NO thrombus, moderate grade III plaque, can not exclude small PFO. Successful direct current cardioversion.  Echo 01/07/22: EF 65-70% with mild LAE and moderate MR.  - LHC and angiography 01/06/22:   Prox RCA lesion is 20% stenosed.   Mid RCA lesion is 20% stenosed.   Dist RCA lesion is 40% stenosed.   RPAV lesion is 40% stenosed.   Prox Cx lesion is 50% stenosed.   2nd Mrg lesion is 50% stenosed.   1st Mrg lesion is 30% stenosed.   Prox LAD lesion is 50% stenosed.   1st Diag lesion is 60% stenosed.   Previously placed Prox LAD to Mid LAD stent of unknown type is  widely patent.   The left ventricular systolic function is normal.   The left ventricular ejection fraction is 50-55% by visual estimate.  1.  Diffuse moderate nonobstructive coronary artery disease with patent stent proximal/mid LAD, 50% stenosis proximal LAD, 50% stenosis proximal left circumflex, 40% distal RCA 2.  Normal left ventricular function with estimated LVEF 50-55% - lisinopril 5mg  daily; d/c this and begin entresto 24/26mg  BID - BMP next visit - metoprolol tartrate 12.5mg  BID - spironolactone 25mg  daily - torsemide 20mg  daily/ potassium 66meq daily - not terribly interested in SGLT2 as he says that he already takes "a lot" of meds - BNP 01/20/22 was 937.3  2: Atrial  fibrillation- - continue amiodarone 100mg  daily - continue apixaban 5mg  BID - continue metoprolol tartrate 12.5mg  BID - saw cardiology Dema Severin) 02/27/22  3: Hypokalemia- - saw PCP @ [facility] 06/03/22 - BMP 04/01/22 reviewed and showed sodium 136, potassium 4.3,  creatinine 1.9 and GFR 41  4: CAD- - previous stent placement in LAD in 2013 - consider pulmonary rehab but he says that he's too busy working to commit 2-3 times/ week   Return in 3 weeks, sooner if needed.

## 2022-06-17 NOTE — Patient Instructions (Signed)
Stop taking lisinopril.   You will start taking entresto on Thursday by taking 1 tablet in the morning and 1 tablet in evening.

## 2022-07-03 DIAGNOSIS — K625 Hemorrhage of anus and rectum: Secondary | ICD-10-CM | POA: Diagnosis not present

## 2022-07-03 DIAGNOSIS — K219 Gastro-esophageal reflux disease without esophagitis: Secondary | ICD-10-CM | POA: Diagnosis not present

## 2022-07-09 ENCOUNTER — Encounter: Payer: BC Managed Care – PPO | Admitting: Family

## 2022-07-09 ENCOUNTER — Other Ambulatory Visit: Payer: Self-pay | Admitting: Family

## 2022-07-16 ENCOUNTER — Other Ambulatory Visit
Admission: RE | Admit: 2022-07-16 | Discharge: 2022-07-16 | Disposition: A | Discharge status: Home / Self Care | Payer: BC Managed Care – PPO | Source: Ambulatory Visit | Attending: Family | Admitting: Family

## 2022-07-16 ENCOUNTER — Encounter: Payer: Self-pay | Admitting: Family

## 2022-07-16 ENCOUNTER — Ambulatory Visit (HOSPITAL_BASED_OUTPATIENT_CLINIC_OR_DEPARTMENT_OTHER): Payer: BC Managed Care – PPO | Admitting: Family

## 2022-07-16 VITALS — BP 127/70 | HR 68 | Wt 263.1 lb

## 2022-07-16 DIAGNOSIS — I10 Essential (primary) hypertension: Secondary | ICD-10-CM | POA: Diagnosis not present

## 2022-07-16 DIAGNOSIS — E876 Hypokalemia: Secondary | ICD-10-CM | POA: Diagnosis not present

## 2022-07-16 DIAGNOSIS — I480 Paroxysmal atrial fibrillation: Secondary | ICD-10-CM

## 2022-07-16 DIAGNOSIS — I5032 Chronic diastolic (congestive) heart failure: Secondary | ICD-10-CM

## 2022-07-16 DIAGNOSIS — I255 Ischemic cardiomyopathy: Secondary | ICD-10-CM | POA: Diagnosis not present

## 2022-07-16 DIAGNOSIS — I2510 Atherosclerotic heart disease of native coronary artery without angina pectoris: Secondary | ICD-10-CM

## 2022-07-16 LAB — BASIC METABOLIC PANEL
Anion gap: 8 (ref 5–15)
BUN: 18 mg/dL (ref 6–20)
CO2: 23 mmol/L (ref 22–32)
Calcium: 9 mg/dL (ref 8.9–10.3)
Chloride: 106 mmol/L (ref 98–111)
Creatinine, Ser: 1.32 mg/dL — ABNORMAL HIGH (ref 0.61–1.24)
GFR, Estimated: >60 mL/min (ref >60)
Glucose, Bld: 91 mg/dL (ref 70–99)
Potassium: 3.8 mmol/L (ref 3.5–5.1)
Sodium: 137 mmol/L (ref 135–145)

## 2022-07-16 NOTE — Patient Instructions (Signed)
Go to the [facility] and get your labs drawn

## 2022-07-16 NOTE — Progress Notes (Signed)
Patient ID: Andres [name], male    DOB: 1966/01/03, 57 y.o.   MRN: [id number]  Primary cardiologist: Dorothyann Peng, MD (last seen earlier today; returns 10/24) PCP: Dan Humphreys, [facility] (last seen 03/24)  HPI  Andres [name] is a 56 y/o male with a history of CAD, atrial fibrillation, kidney stones, tobacco use and chronic heart failure.   TEE 01/13/22: EF 55-60%, moderate LAE, NO thrombus, moderate grade III plaque, can not exclude small PFO. Successful direct current cardioversion.  Echo 01/07/22: EF 65-70% with mild LAE and moderate Andres.   LHC and angiography 01/06/22:   Prox RCA lesion is 20% stenosed.   Mid RCA lesion is 20% stenosed.   Dist RCA lesion is 40% stenosed.   RPAV lesion is 40% stenosed.   Prox Cx lesion is 50% stenosed.   2nd Mrg lesion is 50% stenosed.   1st Mrg lesion is 30% stenosed.   Prox LAD lesion is 50% stenosed.   1st Diag lesion is 60% stenosed.   Previously placed Prox LAD to Mid LAD stent of unknown type is  widely patent.   The left ventricular systolic function is normal.   The left ventricular ejection fraction is 50-55% by visual estimate.  1.  Diffuse moderate nonobstructive coronary artery disease with patent stent proximal/mid LAD, 50% stenosis proximal LAD, 50% stenosis proximal left circumflex, 40% distal RCA 2.  Normal left ventricular function with estimated LVEF 50-55%  Admitted 01/20/22 due to shortness of breath, weight gain and pedal edema. Initially given IV lasix with transition to or diuretics. Cardiology consult obtained. Discharged after 2 days. Was in the ED 01/19/22 due to acute on chronic HF where he was treated and released. Was in the ED 01/16/22 due to hypokalemia with a potassium of 2.5 reported by his PCP. EKG Rate: 54  Rhythm: normal EKG, normal sinus rhythm  Axis: nl Intervals:long qtc 600, no ischemic changes, min ST depressions in lateral leads. A re-check of his ECG shows resolution of prior QTC prolongation and lateral  STD. IV/PO potassium given and he was released. Admitted 01/10/22 due to SOB with AF RVR. Cardiology consult obtained. Started on diltiazem gtt which was stopped after 1 day. Echo done with successful cardioversion. CXR showed pulmonary edema. Given metolazone and IV lasix. Transitioned to oral lasix with potassium. Discharged after 3 days. Admitted 01/05/22 due to SOB/ chest pain. Needed 5L oxygen due to hypoxia. CXR showed pulmonary edema and lasix started. Cardiology consult obtained. Patient underwent left heart catheterization on 01/06/2022 which shows Diffuse moderate nonobstructive coronary artery disease with patent stent proximal/mid LAD, 50% stenosis proximal LAD, 50% stenosis proximal left circumflex, 40% distal RCA.  Widely patent previously placed stent in LAD. Short runs of NSVT & intermittent AF/RVR and metoprolol increased. Weaned off oxygen. Discharged after 3 days.   He presents today for a HF follow-up visit with a chief complaint of minimal fatigue with moderate exertion. Chronic in nature. Has a dry cough along with this. Denies difficulty sleeping, dizziness, abdominal distention, palpitations, pedal edema, chest pain, wheezing, SOB or weight gain.   Since last here, he has been taking entresto and doesn't notice any side effects from it. Saw cardiology earlier today and says that there was a discussion about getting a sleep study as well as nephrology referral.   Past Medical History:  Diagnosis Date   A-fib (HCC)    CHF (congestive heart failure) (HCC)    Coronary artery disease    History of kidney  stones    Past Surgical History:  Procedure Laterality Date   CARDIOVERSION N/A 01/13/2022   Procedure: CARDIOVERSION;  Surgeon: Lamar Blinks, MD;  Location: [facility] ORS;  Service: Cardiovascular;  Laterality: N/A;   LEFT HEART CATH AND CORONARY ANGIOGRAPHY N/A 01/06/2022   Procedure: LEFT HEART CATH AND CORONARY ANGIOGRAPHY possible PCI and stent;  Surgeon: Marcina Millard, MD;  Location: [facility] INVASIVE CV LAB;  Service: Cardiovascular;  Laterality: N/A;   TEE WITHOUT CARDIOVERSION N/A 01/13/2022   Procedure: TRANSESOPHAGEAL ECHOCARDIOGRAM (TEE);  Surgeon: Lamar Blinks, MD;  Location: [facility] ORS;  Service: Cardiovascular;  Laterality: N/A;   No family history on file. Social History   Tobacco Use   Smoking status: Former    Packs/day: 1    Types: Cigarettes    Quit date: 01/04/2022    Years since quitting: 0.5   Smokeless tobacco: Not on file  Substance Use Topics   Alcohol use: Not Currently   Allergies  Allergen Reactions   Statins Other (See Comments)    Myalgias, cannot remember which statin he took in the past but willing to try atorvastatin 12/2021   Prior to Admission medications   Medication Sig Start Date End Date Taking? Authorizing Provider  amiodarone (PACERONE) 200 MG tablet Take 0.5 tablets (100 mg total) by mouth daily. 04/14/22  Yes Clarisa Kindred A, FNP  aspirin EC 81 MG tablet Take 1 tablet (81 mg total) by mouth daily. Swallow whole. 01/05/22  Yes Lynn Ito, MD  atorvastatin (LIPITOR) 40 MG tablet Take 1 tablet (40 mg total) by mouth every evening. 04/14/22  Yes Engelbert Sevin A, FNP  Cyanocobalamin 1000 MCG TBCR Take 1 tablet by mouth daily. 02/06/20  Yes [provider]  ELIQUIS 5 MG TABS tablet TAKE 1 TABLET BY MOUTH TWICE DAILY 07/09/22  Yes Clarisa Kindred A, FNP  metoprolol tartrate (LOPRESSOR) 25 MG tablet Take 12.5 mg by mouth 2 (two) times daily. 01/22/22 01/22/23 Yes [provider]  nitroGLYCERIN (NITROSTAT) 0.4 MG SL tablet  01/20/19  Yes [provider]  potassium chloride SA (KLOR-CON M) 20 MEQ tablet Take 1 tablet (20 mEq total) by mouth daily. 02/14/22  Yes Calah Gershman A, FNP  sacubitril-valsartan (ENTRESTO) 24-26 MG Take 1 tablet by mouth 2 (two) times daily. 06/17/22  Yes Clarisa Kindred A, FNP  spironolactone (ALDACTONE) 25 MG tablet Take 1 tablet (25 mg total) by mouth daily.  01/23/22  Yes Callwood, Dwayne D, MD  Torsemide 40 MG TABS Take 20 mg by mouth daily. And additional  if needed for weight gain, swelling 02/12/22  Yes Delma Freeze, FNP   Review of Systems  Constitutional:  Positive for fatigue. Negative for appetite change.  HENT:  Positive for postnasal drip. Negative for congestion and sore throat.   Eyes: Negative.   Respiratory:  Positive for cough (dry). Negative for chest tightness, shortness of breath and wheezing.        + snoring  Cardiovascular:  Negative for chest pain, palpitations and leg swelling.  Gastrointestinal:  Negative for abdominal distention and abdominal pain.  Endocrine: Negative.   Genitourinary: Negative.   Musculoskeletal:  Positive for back pain (left back). Negative for arthralgias and neck pain.  Allergic/Immunologic: Negative.   Neurological:  Negative for dizziness and light-headedness.  Hematological:  Negative for adenopathy. Does not bruise/bleed easily.  Psychiatric/Behavioral:  Negative for dysphoric mood and sleep disturbance (sleeping on 1 pillow with HOB elevated). The patient is nervous/anxious.    Vitals:  07/16/22 1436  BP: 127/70  Pulse: 68  SpO2: 99%  Weight: 263 lb 2 oz (119.4 kg)   Wt Readings from Last 3 Encounters:  07/16/22 263 lb 2 oz (119.4 kg)  06/17/22 260 lb (117.9 kg)  04/14/22 260 lb 9.6 oz (118.2 kg)   Lab Results  Component Value Date   CREATININE 2.06 (H) 02/12/2022   CREATININE 1.73 (H) 01/29/2022   CREATININE 1.34 (H) 01/22/2022   Physical Exam Vitals and nursing note reviewed.  Constitutional:      Appearance: Normal appearance.  HENT:     Head: Normocephalic and atraumatic.  Cardiovascular:     Rate and Rhythm: Normal rate and regular rhythm.  Pulmonary:     Effort: Pulmonary effort is normal. No respiratory distress.     Breath sounds: No wheezing or rales.  Abdominal:     General: There is no distension.     Palpations: Abdomen is soft.     Tenderness: There  is no abdominal tenderness.  Musculoskeletal:        General: No tenderness.     Cervical back: Normal range of motion and neck supple.     Right lower leg: No edema.     Left lower leg: No edema.  Skin:    General: Skin is warm and dry.  Neurological:     General: No focal deficit present.     Mental Status: He is alert and oriented to person, place, and time.  Psychiatric:        Mood and Affect: Mood normal.        Behavior: Behavior normal.        Thought Content: Thought content normal.   Assessment & Plan:  1: Ischemic heart failure with preserved ejection fraction- - NYHA class II - euvolemic today - weighing daily; reminded to call for an overnight weight gain of > 2 pounds or a weekly weight gain of > 5 pounds - weight up 3 pounds from last visit here 1 month ago - TEE 01/13/22: EF 55-60%, moderate LAE, NO thrombus, moderate grade III plaque, can not exclude small PFO. Successful direct current cardioversion.  Echo 01/07/22: EF 65-70% with mild LAE and moderate Andres.  - continue entresto 24/26mg  BID - continue metoprolol tartrate 12.5mg  BID - continue spironolactone 25mg  daily - continue torsemide 20mg  daily/ potassium daily - BMP today since he's been taking entresto - not terribly interested in SGLT2 as he says that he already takes "a lot" of meds; will continue to discuss - BNP 01/20/22 was 937.3  2: Atrial fibrillation- - continue amiodarone 100mg  daily - continue apixaban 5mg  BID - continue metoprolol tartrate 12.5mg  BID - saw cardiology Juliann Pares) 04/24; returns 10/24 - patient says there is discussion about possible sleep study  3: Hypokalemia- - saw PCP @ [facility] 03/24 - BMP 04/01/22 reviewed and showed sodium 136, potassium 4.3, creatinine 1.9 and GFR 41 - says that cardiology mentioned making nephrology referral  4: CAD- - previous stent placement in LAD in 2013 - LHC and angiography 01/06/22:   Prox RCA lesion is 20% stenosed.   Mid RCA  lesion is 20% stenosed.   Dist RCA lesion is 40% stenosed.   RPAV lesion is 40% stenosed.   Prox Cx lesion is 50% stenosed.   2nd Mrg lesion is 50% stenosed.   1st Mrg lesion is 30% stenosed.   Prox LAD lesion is 50% stenosed.   1st Diag lesion is 60% stenosed.   Previously  placed Prox LAD to Mid LAD stent of unknown type is  widely patent.   The left ventricular systolic function is normal.   The left ventricular ejection fraction is 50-55% by visual estimate.  1.  Diffuse moderate nonobstructive coronary artery disease with patent stent proximal/mid LAD, 50% stenosis proximal LAD, 50% stenosis proximal left circumflex, 40% distal RCA 2.  Normal left ventricular function with estimated LVEF 50-55%  Return in 3 months, sooner if needed.

## 2022-08-04 DIAGNOSIS — J439 Emphysema, unspecified: Secondary | ICD-10-CM | POA: Diagnosis not present

## 2022-08-04 DIAGNOSIS — R590 Localized enlarged lymph nodes: Secondary | ICD-10-CM | POA: Diagnosis not present

## 2022-08-04 DIAGNOSIS — R0602 Shortness of breath: Secondary | ICD-10-CM | POA: Diagnosis not present

## 2022-08-04 DIAGNOSIS — J628 Pneumoconiosis due to other dust containing silica: Secondary | ICD-10-CM | POA: Diagnosis not present

## 2022-09-03 DIAGNOSIS — R0989 Other specified symptoms and signs involving the circulatory and respiratory systems: Secondary | ICD-10-CM | POA: Diagnosis not present

## 2022-09-03 DIAGNOSIS — I739 Peripheral vascular disease, unspecified: Secondary | ICD-10-CM | POA: Diagnosis not present

## 2022-09-16 ENCOUNTER — Other Ambulatory Visit: Payer: Self-pay | Admitting: Family

## 2022-10-10 ENCOUNTER — Ambulatory Visit (INDEPENDENT_AMBULATORY_CARE_PROVIDER_SITE_OTHER): Payer: BC Managed Care – PPO

## 2022-10-10 ENCOUNTER — Ambulatory Visit (INDEPENDENT_AMBULATORY_CARE_PROVIDER_SITE_OTHER): Payer: BC Managed Care – PPO | Admitting: Nurse Practitioner

## 2022-10-10 ENCOUNTER — Encounter (INDEPENDENT_AMBULATORY_CARE_PROVIDER_SITE_OTHER): Payer: Self-pay | Admitting: Nurse Practitioner

## 2022-10-10 ENCOUNTER — Other Ambulatory Visit: Payer: Self-pay | Admitting: Nurse Practitioner

## 2022-10-10 VITALS — BP 110/65 | HR 65 | Resp 16 | Wt 266.0 lb

## 2022-10-10 DIAGNOSIS — I739 Peripheral vascular disease, unspecified: Secondary | ICD-10-CM

## 2022-10-10 DIAGNOSIS — F17200 Nicotine dependence, unspecified, uncomplicated: Secondary | ICD-10-CM | POA: Diagnosis not present

## 2022-10-10 DIAGNOSIS — I2510 Atherosclerotic heart disease of native coronary artery without angina pectoris: Secondary | ICD-10-CM

## 2022-10-10 DIAGNOSIS — I10 Essential (primary) hypertension: Secondary | ICD-10-CM | POA: Diagnosis not present

## 2022-10-11 ENCOUNTER — Encounter (INDEPENDENT_AMBULATORY_CARE_PROVIDER_SITE_OTHER): Payer: Self-pay | Admitting: Nurse Practitioner

## 2022-10-11 NOTE — Progress Notes (Signed)
Subjective:    Patient ID: Andres [name], male    DOB: 08/04/1965, 57 y.o.   MRN: [id number] Chief Complaint  Patient presents with   New Patient (Initial Visit)    Ref Susann Givens consult claudication symptoms    Andres [name] is a 57 year old male that presents today for evaluation of claudication-like symptoms.  He notes that he does not have a claudication-like symptoms all the time but they are significant when they occur.  He endorses having severe cramping and discomfort in his thigh area.  He denies any cramping or issues in the calf area.  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  However he notes that there is times when he is walking around the house or doing other things outside that he does not have any issues.  He also notes that his hips hurt at times as well.  Recently in October he was in and out of the hospital with cardiac issues including the placement of a new heart stent and diagnosis of heart failure.  He was also subsequently diagnosed with atrial fibrillation during that time as well.  He denies any rest pain like symptoms.  There are currently no open wounds or ulcerations.  He denies any back issues.  Today noninvasive studies show an ABI of 1.01 on the right and 1.04 on the left.  There is a TBI 0.60 on the right and 0.74 on the left.  The patient has triphasic/biphasic tibial artery waveforms bilaterally.    Review of Systems  Cardiovascular:  Negative for leg swelling.       Claudication  Musculoskeletal:  Negative for back pain.  Skin:  Negative for wound.  All other systems reviewed and are negative.      Objective:   Physical Exam Vitals reviewed.  HENT:     Head: Normocephalic.  Cardiovascular:     Rate and Rhythm: Normal rate. Rhythm irregular.     Pulses:          Dorsalis pedis pulses are detected w/ Doppler on the  right side and detected w/ Doppler on the left side.       Posterior tibial pulses are 1+ on the right side and 1+ on the left side.  Pulmonary:     Effort: Pulmonary effort is normal.  Musculoskeletal:     Right lower leg: No edema.     Left lower leg: No edema.  Skin:    General: Skin is warm and dry.  Neurological:     Mental Status: He is alert and oriented to person, place, and time.  Psychiatric:        Mood and Affect: Mood normal.        Behavior: Behavior normal.        Thought Content: Thought content normal.        Judgment: Judgment normal.     BP 110/65 (BP Location: Left Arm)   Pulse 65   Resp 16   Wt 266 lb (120.7 kg)   BMI 36.08 kg/m   Past Medical History:  Diagnosis Date   A-fib (HCC)    CHF (congestive heart failure) (HCC)    Coronary artery disease    History of kidney stones     Social History   Socioeconomic History   Marital status: Married    Spouse name:  Not on file   Number of children: Not on file   Years of education: Not on file   Highest education level: Not on file  Occupational History   Not on file  Tobacco Use   Smoking status: Former    Current packs/day: 0.00    Types: Cigarettes    Quit date: 01/04/2022    Years since quitting: 0.7   Smokeless tobacco: Not on file  Vaping Use   Vaping status: Never Used  Substance and Sexual Activity   Alcohol use: Not Currently   Drug use: Never   Sexual activity: Not on file  Other Topics Concern   Not on file  Social History Narrative   Not on file   Social Determinants of Health   Financial Resource Strain: Low Risk  (06/03/2022)   Received from [facility], [facility]   Overall Financial Resource Strain (CARDIA)    Difficulty of Paying Living Expenses: Not hard at all  Food Insecurity: Unknown (06/03/2022)   Received from [facility], [facility]   Hunger Vital Sign    Worried About Running Out of Food  in the Last Year: Never true    Ran Out of Food in the Last Year: Not on file  Transportation Needs: Unknown (06/03/2022)   Received from [facility], [facility]   [facility] - Transportation    In the past 12 months, has lack of transportation kept you from medical appointments or from getting medications?: No    Lack of Transportation (Non-Medical): Not on file  Physical Activity: Not on file  Stress: Not on file  Social Connections: Not on file  Intimate Partner Violence: Not At Risk (01/21/2022)   Humiliation, Afraid, Rape, and Kick questionnaire    Fear of Current or Ex-Partner: No    Emotionally Abused: No    Physically Abused: No    Sexually Abused: No    Past Surgical History:  Procedure Laterality Date   CARDIOVERSION N/A 01/13/2022   Procedure: CARDIOVERSION;  Surgeon: Lamar Blinks, MD;  Location: [facility] ORS;  Service: Cardiovascular;  Laterality: N/A;   LEFT HEART CATH AND CORONARY ANGIOGRAPHY N/A 01/06/2022   Procedure: LEFT HEART CATH AND CORONARY ANGIOGRAPHY possible PCI and stent;  Surgeon: Marcina Millard, MD;  Location: [facility] INVASIVE CV LAB;  Service: Cardiovascular;  Laterality: N/A;   TEE WITHOUT CARDIOVERSION N/A 01/13/2022   Procedure: TRANSESOPHAGEAL ECHOCARDIOGRAM (TEE);  Surgeon: Lamar Blinks, MD;  Location: [facility] ORS;  Service: Cardiovascular;  Laterality: N/A;    History reviewed. No pertinent family history.  Allergies  Allergen Reactions   Statins Other (See Comments)    Myalgias, cannot remember which statin he took in the past but willing to try atorvastatin 12/2021       Latest Ref Rng & Units 01/20/2022    1:00 PM 01/20/2022    8:48 AM 01/19/2022    6:48 AM  CBC  WBC 4.0 - 10.5 K/uL 11.2  12.1  12.5   Hemoglobin 13.0 - 17.0 g/dL 16.1  09.6  04.5   Hematocrit 39.0 - 52.0 % 40.5  39.3  39.0   Platelets 150 - 400 K/uL 268  259  253       CMP     Component Value Date/Time   NA 137 07/16/2022  1522   NA 137 07/15/2013 0505   K 3.8 07/16/2022 1522   K 4.3 07/15/2013 0505   CL 106  07/16/2022 1522   CL 105 07/15/2013 0505   CO2 23 07/16/2022 1522   CO2 22 07/15/2013 0505   GLUCOSE 91 07/16/2022 1522   GLUCOSE 122 (H) 07/15/2013 0505   BUN 18 07/16/2022 1522   BUN 20 (H) 07/15/2013 0505   CREATININE 1.32 (H) 07/16/2022 1522   CREATININE 0.77 07/15/2013 0505   CALCIUM 9.0 07/16/2022 1522   CALCIUM 8.4 (L) 07/15/2013 0505   PROT 7.1 01/20/2022 0848   PROT 5.0 (L) 07/15/2013 0535   ALBUMIN 3.7 01/20/2022 0848   ALBUMIN 2.6 (L) 07/15/2013 0535   AST 17 01/20/2022 0848   AST 19 07/15/2013 0535   ALT 23 01/20/2022 0848   ALT 26 07/15/2013 0535   ALKPHOS 64 01/20/2022 0848   ALKPHOS 60 07/15/2013 0535   BILITOT 1.4 (H) 01/20/2022 0848   BILITOT 0.2 07/15/2013 0535   GFRNONAA >60 07/16/2022 1522   GFRNONAA >60 07/15/2013 0505     No results found.     Assessment & Plan:   1. Claudication [facility]) Currently the patient has evidence symptoms of claudication based on his description.  However, based on his studies what is unclear is if his claudication is more neurogenic in nature versus related to arterial insufficiency.  The patient certainly has a number of significant risk factors for peripheral arterial disease.  Given the fact that his studies today are not 100% normal and based on the description and location of pain, I feel will be prudent to evaluate the patient's aorta and iliac vessels in order to evaluate for inflow disease.  2. Primary hypertension Continue antihypertensive medications as already ordered, these medications have been reviewed and there are no changes at this time.  3. Tobacco use disorder Smoking cessation was discussed, 3-10 minutes spent on this topic specifically  4. Coronary artery disease involving native coronary artery of native heart without angina pectoris Continue cardiac and antihypertensive medications as already ordered and reviewed,  no changes at this time.  Continue statin as ordered and reviewed, no changes at this time  Nitrates PRN for chest pain      There are no Patient Instructions on file for this visit. No follow-ups on file.   Georgiana Spinner, NP

## 2022-10-14 ENCOUNTER — Other Ambulatory Visit (INDEPENDENT_AMBULATORY_CARE_PROVIDER_SITE_OTHER): Payer: Self-pay | Admitting: Nurse Practitioner

## 2022-10-14 DIAGNOSIS — I739 Peripheral vascular disease, unspecified: Secondary | ICD-10-CM

## 2022-10-15 LAB — VAS US ABI WITH/WO TBI
Left ABI: 1.04
Right ABI: 1.01

## 2022-10-16 ENCOUNTER — Ambulatory Visit (INDEPENDENT_AMBULATORY_CARE_PROVIDER_SITE_OTHER): Payer: BC Managed Care – PPO

## 2022-10-16 ENCOUNTER — Encounter: Payer: Self-pay | Admitting: Family

## 2022-10-16 ENCOUNTER — Ambulatory Visit: Payer: BC Managed Care – PPO | Attending: Family | Admitting: Family

## 2022-10-16 VITALS — BP 116/52 | HR 58 | Ht 72.0 in | Wt 266.0 lb

## 2022-10-16 DIAGNOSIS — I4891 Unspecified atrial fibrillation: Secondary | ICD-10-CM | POA: Insufficient documentation

## 2022-10-16 DIAGNOSIS — Z79899 Other long term (current) drug therapy: Secondary | ICD-10-CM | POA: Insufficient documentation

## 2022-10-16 DIAGNOSIS — I480 Paroxysmal atrial fibrillation: Secondary | ICD-10-CM | POA: Diagnosis not present

## 2022-10-16 DIAGNOSIS — Z955 Presence of coronary angioplasty implant and graft: Secondary | ICD-10-CM | POA: Insufficient documentation

## 2022-10-16 DIAGNOSIS — J449 Chronic obstructive pulmonary disease, unspecified: Secondary | ICD-10-CM | POA: Diagnosis not present

## 2022-10-16 DIAGNOSIS — I5030 Unspecified diastolic (congestive) heart failure: Secondary | ICD-10-CM | POA: Insufficient documentation

## 2022-10-16 DIAGNOSIS — E876 Hypokalemia: Secondary | ICD-10-CM | POA: Insufficient documentation

## 2022-10-16 DIAGNOSIS — I739 Peripheral vascular disease, unspecified: Secondary | ICD-10-CM | POA: Diagnosis not present

## 2022-10-16 DIAGNOSIS — E785 Hyperlipidemia, unspecified: Secondary | ICD-10-CM | POA: Diagnosis not present

## 2022-10-16 DIAGNOSIS — I5032 Chronic diastolic (congestive) heart failure: Secondary | ICD-10-CM

## 2022-10-16 DIAGNOSIS — Z7901 Long term (current) use of anticoagulants: Secondary | ICD-10-CM | POA: Diagnosis not present

## 2022-10-16 DIAGNOSIS — I472 Ventricular tachycardia, unspecified: Secondary | ICD-10-CM | POA: Diagnosis not present

## 2022-10-16 DIAGNOSIS — I2510 Atherosclerotic heart disease of native coronary artery without angina pectoris: Secondary | ICD-10-CM | POA: Insufficient documentation

## 2022-10-16 MED ORDER — ENTRESTO 24-26 MG PO TABS: 1.0000 | ORAL_TABLET | Freq: Two times a day (BID) | ORAL | 5 refills | Status: DC

## 2022-10-16 MED ORDER — METOPROLOL TARTRATE 25 MG PO TABS: 12.5000 mg | ORAL_TABLET | Freq: Two times a day (BID) | ORAL | 5 refills | Status: AC

## 2022-10-16 MED ORDER — SPIRONOLACTONE 25 MG PO TABS: 25.0000 mg | ORAL_TABLET | Freq: Every day | ORAL | 6 refills | Status: DC

## 2022-10-16 MED ORDER — AMIODARONE HCL 200 MG PO TABS: 100.0000 mg | ORAL_TABLET | Freq: Every day | ORAL | 5 refills | Status: AC

## 2022-10-16 NOTE — Patient Instructions (Signed)
For the next 3 days, take 2 torsemide and 2 potassium tablets daily.

## 2022-10-16 NOTE — Progress Notes (Signed)
PCP: [facility] (last seen 06/24) Primary Cardiologist: Andres Peng, MD (04/24 returns 10/24)  HPI:  Andres [name] is a 57 y/o male with a history of CAD, MI s/p PCI/DES to prox-mid LAD (2013), aortic atherosclerosis, atrial fibrillation s/p cardioversion (01/13/2022), NSVT, hyperlipidemia, psoriasis, questionable to small PFO (noted on TEE in 12/2021), kidney stones, tobacco use and chronic heart failure.   Admitted 01/05/22 due to SOB/ chest pain. Needed 5L oxygen due to hypoxia. CXR showed pulmonary edema and lasix started. Cardiology consult obtained. Patient underwent left heart catheterization on 01/06/2022 which shows Diffuse moderate nonobstructive coronary artery disease with patent stent proximal/mid LAD, 50% stenosis proximal LAD, 50% stenosis proximal left circumflex, 40% distal RCA.  Widely patent previously placed stent in LAD. Short runs of NSVT & intermittent AF/RVR and metoprolol increased. Weaned off oxygen. Discharged after 3 days. Admitted 01/10/22 due to SOB with AF RVR. Cardiology consult obtained. Started on diltiazem gtt which was stopped after 1 day. Echo done with successful cardioversion. CXR showed pulmonary edema. Given metolazone and IV lasix. Transitioned to oral lasix with potassium. Discharged after 3 days. Was in the ED 01/16/22 due to hypokalemia with a potassium of 2.5 reported by his PCP. EKG Rate: 54  Rhythm: normal EKG, normal sinus rhythm  Axis: nl Intervals:long qtc 600, no ischemic changes, min ST depressions in lateral leads. A re-check of his ECG shows. Was in the ED 01/19/22 due to acute on chronic HF where he was treated and released. resolution of prior QTC prolongation and lateral STD. IV/PO potassium given and he was released. Admitted 01/20/22 due to shortness of breath, weight gain and pedal edema. Initially given IV lasix with transition to or diuretics. Cardiology consult obtained. Discharged after 2 days.   Echo 01/07/22: EF 65-70% with mild  LAE and moderate Andres.  TEE 01/13/22: EF 55-60%, moderate LAE, NO thrombus, moderate grade III plaque, can not exclude small PFO. Successful direct current cardioversion.    LHC and angiography 01/06/22:   Prox RCA lesion is 20% stenosed.   Mid RCA lesion is 20% stenosed.   Dist RCA lesion is 40% stenosed.   RPAV lesion is 40% stenosed.   Prox Cx lesion is 50% stenosed.   2nd Mrg lesion is 50% stenosed.   1st Mrg lesion is 30% stenosed.   Prox LAD lesion is 50% stenosed.   1st Diag lesion is 60% stenosed.   Previously placed Prox LAD to Mid LAD stent of unknown type is  widely patent.   The left ventricular systolic function is normal.   The left ventricular ejection fraction is 50-55% by visual estimate.  1.  Diffuse moderate nonobstructive coronary artery disease with patent stent proximal/mid LAD, 50% stenosis proximal LAD, 50% stenosis proximal left circumflex, 40% distal RCA 2.  Normal left ventricular function with estimated LVEF 50-55%  He presents today for a HF follow-up visit with a chief complaint of intermittent SOB with exertion. Chronic in nature. Has associated leg cramps in thighs at times, intermittent dizziness, fatigue and intermittent edema along with this. Denies chest pain, palpitations or difficulty sleeping.   Says that when we decreased his torsemide to 20mg  daily, he noticed an increase in his edema and SOB so he subsequently went back up to the 40mg  dose and says that his symptoms are improving.   ROS: All systems negative except as listed in HPI, PMH and Problem List.  SH:  Social History   Socioeconomic History   Marital status: Married  Spouse name: Not on file   Number of children: Not on file   Years of education: Not on file   Highest education level: Not on file  Occupational History   Not on file  Tobacco Use   Smoking status: Former    Current packs/day: 0.00    Types: Cigarettes    Quit date: 01/04/2022    Years since quitting: 0.7    Smokeless tobacco: Not on file  Vaping Use   Vaping status: Never Used  Substance and Sexual Activity   Alcohol use: Not Currently   Drug use: Never   Sexual activity: Not on file  Other Topics Concern   Not on file  Social History Narrative   Not on file   Social Determinants of Health   Financial Resource Strain: Low Risk  (06/03/2022)   Received from [facility], [facility]   Overall Financial Resource Strain (CARDIA)    Difficulty of Paying Living Expenses: Not hard at all  Food Insecurity: Unknown (06/03/2022)   Received [facility], [facility]   Hunger Vital Sign    Worried About Running Out of Food in the Last Year: Never true    Ran Out of Food in the Last Year: Not on file  Transportation Needs: Unknown (06/03/2022)   Received from [facility], [facility]   [facility]    In the past 12 months, has lack of transportation kept you from medical appointments or from getting medications?: No    Lack of Transportation (Non-Medical): Not on file  Physical Activity: Not on file  Stress: Not on file  Social Connections: Not on file  Intimate Partner Violence: Not At Risk (01/21/2022)   Humiliation, Afraid, Rape, and Kick questionnaire    Fear of Current or Ex-Partner: No    Emotionally Abused: No    Physically Abused: No    Sexually Abused: No    FH: No family history on file.  Past Medical History:  Diagnosis Date   A-fib [facility])    CHF (congestive heart failure) (HCC)    Coronary artery disease    History of kidney stones     Prior to Admission medications   Medication Sig Start Date End Date Taking? Authorizing Provider  aspirin EC 81 MG tablet Take 1 tablet (81 mg total) by mouth daily. Swallow whole. 01/05/22  Yes Lynn Ito, MD  atorvastatin (LIPITOR) 40 MG tablet Take 1 tablet (40 mg total) by mouth every evening. 04/14/22  Yes  Areli Jowett A, FNP  Cyanocobalamin 1000 MCG TBCR Take 1 tablet by mouth daily. 02/06/20  Yes [provider]  ELIQUIS 5 MG TABS tablet TAKE 1 TABLET BY MOUTH TWICE DAILY 07/09/22  Yes Clarisa Kindred A, FNP  potassium chloride SA (KLOR-CON M) 20 MEQ tablet Take 1 tablet (20 mEq total) by mouth daily. 02/14/22  Yes Jaiven Graveline A, FNP  Torsemide 40 MG TABS Take 20 mg by mouth daily. And additional 20mg  if needed for weight gain, swelling Patient taking differently: Take 40 mg by mouth daily. And additional 20mg  if needed for weight gain, swelling 02/12/22  Yes Clarisa Kindred A, FNP  amiodarone (PACERONE) 200 MG tablet Take 0.5 tablets (100 mg total) by mouth daily. 10/16/22   Delma Freeze, FNP  metoprolol tartrate (LOPRESSOR) 25 MG tablet Take 0.5 tablets (12.5 mg total) by mouth 2 (two) times daily. 10/16/22 10/16/23  Delma Freeze, FNP  nitroGLYCERIN (NITROSTAT) 0.4 MG SL tablet  01/20/19   [provider]  sacubitril-valsartan (ENTRESTO) 24-26 MG Take 1 tablet by mouth 2 (two) times daily. 10/16/22   Delma Freeze, FNP  spironolactone (ALDACTONE) 25 MG tablet Take 1 tablet (25 mg total) by mouth daily. 10/16/22   Delma Freeze, FNP     Vitals:   10/16/22 1452  BP: (!) 116/52  Pulse: (!) 58  SpO2: 98%  Weight: 266 lb (120.7 kg)  Height: 6' (1.829 m)   Wt Readings from Last 3 Encounters:  10/16/22 266 lb (120.7 kg)  10/10/22 266 lb (120.7 kg)  07/16/22 263 lb 2 oz (119.4 kg)   Lab Results  Component Value Date   CREATININE 1.32 (H) 07/16/2022   CREATININE 2.06 (H) 02/12/2022   CREATININE 1.73 (H) 01/29/2022    PHYSICAL EXAM:  General:  Well appearing. No resp difficulty HEENT: normal Neck: supple. JVP flat. No lymphadenopathy or thryomegaly appreciated. Cor: PMI normal. Regular rhythm, bradycardic. No rubs, gallops or murmurs. Lungs: clear Abdomen: soft, nontender, nondistended. No hepatosplenomegaly. No bruits or masses.  Extremities: no cyanosis,  clubbing, rash, 1+ pitting edema in bilateral lower legs Neuro: alert & oriented x3, cranial nerves grossly intact. Moves all 4 extremities w/o difficulty. Affect pleasant.   ECG: not done  ReDs: 39%   ASSESSMENT & PLAN:  1: Ischemic heart failure with preserved ejection fraction- - NYHA class II - mildly fluid overloaded today with worsening symptoms and elevated ReDs - weighing daily; reminded to call for an overnight weight gain of > 2 pounds or a weekly weight gain of > 5 pounds - weight up 3 pounds from last visit here 3 months ago - ReDs 39% - Echo 01/07/22: EF 65-70% with mild LAE and moderate Andres.  - TEE 01/13/22: EF 55-60%, moderate LAE, NO thrombus, moderate grade III plaque, can not exclude small PFO. Successful direct current cardioversion.   - continue entresto 24/26mg  BID - continue metoprolol tartrate 12.5mg  BID - continue spironolactone 25mg  daily - for the next 3 days, increase torsemide to 80mg  daily/ potassium daily and then resume his torsemide 40mg  daily/ potassium daily - not terribly interested in SGLT2 as he says that he already takes "a lot" of meds; will continue to discuss - BMP next visit - BNP 01/20/22 was 937.3  2: Atrial fibrillation- - continue amiodarone 100mg  daily - continue apixaban 5mg  BID - continue metoprolol tartrate 12.5mg  BID - saw cardiology Juliann Pares) 04/24; returns 10/24  3: Hypokalemia- - saw PCP @ [facility] 06/24 - BMP 07/16/22 reviewed and showed sodium 136, potassium 4.3, creatinine 1.9 and GFR 41 - BMP next visit  4: CAD- - previous stent placement in LAD in 2013 - LHC and angiography 01/06/22:   Prox RCA lesion is 20% stenosed.   Mid RCA lesion is 20% stenosed.   Dist RCA lesion is 40% stenosed.   RPAV lesion is 40% stenosed.   Prox Cx lesion is 50% stenosed.   2nd Mrg lesion is 50% stenosed.   1st Mrg lesion is 30% stenosed.   Prox LAD lesion is 50% stenosed.   1st Diag lesion is 60% stenosed.    Previously placed Prox LAD to Mid LAD stent of unknown type is  widely patent.   The left ventricular systolic function is normal.   The left ventricular ejection fraction is 50-55% by visual estimate.  1.  Diffuse moderate nonobstructive coronary artery disease with patent stent proximal/mid LAD, 50% stenosis  proximal LAD, 50% stenosis proximal left circumflex, 40% distal RCA 2.  Normal left ventricular function with estimated LVEF 50-55%  5: COPD- - saw pulmonology Meredeth Ide) 05/24 - PFT 08/04/22   Return in 2 weeks, sooner if needed.

## 2022-10-28 ENCOUNTER — Ambulatory Visit (INDEPENDENT_AMBULATORY_CARE_PROVIDER_SITE_OTHER): Payer: BC Managed Care – PPO | Admitting: Vascular Surgery

## 2022-10-28 ENCOUNTER — Ambulatory Visit: Payer: BC Managed Care – PPO | Attending: Family | Admitting: Family

## 2022-10-28 ENCOUNTER — Encounter: Payer: Self-pay | Admitting: Family

## 2022-10-28 ENCOUNTER — Encounter (INDEPENDENT_AMBULATORY_CARE_PROVIDER_SITE_OTHER): Payer: Self-pay | Admitting: Vascular Surgery

## 2022-10-28 VITALS — BP 133/69 | HR 67 | Resp 17 | Ht 72.0 in | Wt 267.4 lb

## 2022-10-28 VITALS — BP 144/69 | HR 72 | Wt 268.0 lb

## 2022-10-28 DIAGNOSIS — Z87442 Personal history of urinary calculi: Secondary | ICD-10-CM | POA: Diagnosis not present

## 2022-10-28 DIAGNOSIS — I2510 Atherosclerotic heart disease of native coronary artery without angina pectoris: Secondary | ICD-10-CM

## 2022-10-28 DIAGNOSIS — I252 Old myocardial infarction: Secondary | ICD-10-CM | POA: Insufficient documentation

## 2022-10-28 DIAGNOSIS — E876 Hypokalemia: Secondary | ICD-10-CM | POA: Diagnosis not present

## 2022-10-28 DIAGNOSIS — I4891 Unspecified atrial fibrillation: Secondary | ICD-10-CM

## 2022-10-28 DIAGNOSIS — L409 Psoriasis, unspecified: Secondary | ICD-10-CM | POA: Diagnosis not present

## 2022-10-28 DIAGNOSIS — Z955 Presence of coronary angioplasty implant and graft: Secondary | ICD-10-CM | POA: Insufficient documentation

## 2022-10-28 DIAGNOSIS — I739 Peripheral vascular disease, unspecified: Secondary | ICD-10-CM

## 2022-10-28 DIAGNOSIS — E785 Hyperlipidemia, unspecified: Secondary | ICD-10-CM | POA: Diagnosis not present

## 2022-10-28 DIAGNOSIS — I5030 Unspecified diastolic (congestive) heart failure: Secondary | ICD-10-CM | POA: Diagnosis not present

## 2022-10-28 DIAGNOSIS — I5032 Chronic diastolic (congestive) heart failure: Secondary | ICD-10-CM | POA: Diagnosis not present

## 2022-10-28 DIAGNOSIS — Z87891 Personal history of nicotine dependence: Secondary | ICD-10-CM | POA: Diagnosis not present

## 2022-10-28 DIAGNOSIS — I10 Essential (primary) hypertension: Secondary | ICD-10-CM

## 2022-10-28 DIAGNOSIS — I2589 Other forms of chronic ischemic heart disease: Secondary | ICD-10-CM | POA: Diagnosis not present

## 2022-10-28 DIAGNOSIS — J449 Chronic obstructive pulmonary disease, unspecified: Secondary | ICD-10-CM

## 2022-10-28 DIAGNOSIS — I480 Paroxysmal atrial fibrillation: Secondary | ICD-10-CM | POA: Diagnosis not present

## 2022-10-28 DIAGNOSIS — I700 Atherosclerosis of aorta: Secondary | ICD-10-CM | POA: Insufficient documentation

## 2022-10-28 MED ORDER — NITROGLYCERIN 0.4 MG SL SUBL: 0.4000 mg | SUBLINGUAL_TABLET | SUBLINGUAL | 3 refills | Status: AC | PRN

## 2022-10-28 NOTE — Assessment & Plan Note (Signed)
blood pressure control important in reducing the progression of atherosclerotic disease. On appropriate oral medications.  

## 2022-10-28 NOTE — Progress Notes (Signed)
MRN : [id number]  Andres [name] is a 57 y.o. ([birth year]) male who presents with chief complaint of  Chief Complaint  Patient presents with   Venous Insufficiency  .  History of Present Illness: Patient returns today in follow up of claudication symptoms.  Since his last visit, he has undergone aortic duplex which did not show any aortoiliac aneurysm or occlusive disease of significance.  His ABIs were fairly normal initially.  He continues to be bothered by leg symptoms particularly after sitting for long periods of time or certain positions or activities.  Current Outpatient Medications  Medication Sig Dispense Refill   amiodarone (PACERONE) 200 MG tablet Take 0.5 tablets (100 mg total) by mouth daily. 15 tablet 5   aspirin EC 81 MG tablet Take 1 tablet (81 mg total) by mouth daily. Swallow whole. 30 tablet 12   atorvastatin (LIPITOR) 40 MG tablet Take 1 tablet (40 mg total) by mouth every evening. 30 tablet 5   Cyanocobalamin 1000 MCG TBCR Take 1 tablet by mouth daily.     ELIQUIS 5 MG TABS tablet TAKE 1 TABLET BY MOUTH TWICE DAILY 60 tablet 5   metoprolol tartrate (LOPRESSOR) 25 MG tablet Take 0.5 tablets (12.5 mg total) by mouth 2 (two) times daily. 30 tablet 5   potassium chloride SA (KLOR-CON M) 20 MEQ tablet Take 1 tablet (20 mEq total) by mouth daily. 90 tablet 3   sacubitril-valsartan (ENTRESTO) 24-26 MG Take 1 tablet by mouth 2 (two) times daily. 60 tablet 5   spironolactone (ALDACTONE) 25 MG tablet Take 1 tablet (25 mg total) by mouth daily. 30 tablet 6   Torsemide 40 MG TABS Take 20 mg by mouth daily. And additional 20mg  if needed for weight gain, swelling 60 tablet 5   nitroGLYCERIN (NITROSTAT) 0.4 MG SL tablet Place 1 tablet (0.4 mg total) under the tongue every 5 (five) minutes as needed for chest pain. 20 tablet 3   No current facility-administered medications for this visit.    Past Medical History:  Diagnosis Date   A-fib [facility])    CHF (congestive heart failure)  (HCC)    Coronary artery disease    History of kidney stones     Past Surgical History:  Procedure Laterality Date   CARDIOVERSION N/A 01/13/2022   Procedure: CARDIOVERSION;  Surgeon: Lamar Blinks, MD;  Location: [facility] ORS;  Service: Cardiovascular;  Laterality: N/A;   LEFT HEART CATH AND CORONARY ANGIOGRAPHY N/A 01/06/2022   Procedure: LEFT HEART CATH AND CORONARY ANGIOGRAPHY possible PCI and stent;  Surgeon: Marcina Millard, MD;  Location: [facility] INVASIVE CV LAB;  Service: Cardiovascular;  Laterality: N/A;   TEE WITHOUT CARDIOVERSION N/A 01/13/2022   Procedure: TRANSESOPHAGEAL ECHOCARDIOGRAM (TEE);  Surgeon: Lamar Blinks, MD;  Location: [facility] ORS;  Service: Cardiovascular;  Laterality: N/A;     Social History   Tobacco Use   Smoking status: Former    Current packs/day: 0.00    Types: Cigarettes    Quit date: 01/04/2022    Years since quitting: 0.8  Vaping Use   Vaping status: Never Used  Substance Use Topics   Alcohol use: Not Currently   Drug use: Never      Family History  Problem Relation Age of Onset   Stroke Father    Cancer Paternal Aunt    Cancer Paternal Aunt    Stroke Paternal Uncle    Aneurysm Maternal Grandmother    Stroke Paternal Grandmother    Heart disease Paternal Grandfather  Allergies  Allergen Reactions   Statins Other (See Comments)    Myalgias, cannot remember which statin he took in the past but willing to try atorvastatin 12/2021     REVIEW OF SYSTEMS (Negative unless checked)  Constitutional: [] Weight loss  [] Fever  [] Chills Cardiac: [] Chest pain   [] Chest pressure   [] Palpitations   [] Shortness of breath when laying flat   [] Shortness of breath at rest   [] Shortness of breath with exertion. Vascular:  [x] Pain in legs with walking   [] Pain in legs at rest   [] Pain in legs when laying flat   [x] Claudication   [] Pain in feet when walking  [] Pain in feet at rest  [] Pain in feet when laying flat   [] History of DVT    [] Phlebitis   [] Swelling in legs   [] Varicose veins   [] Non-healing ulcers Pulmonary:   [] Uses home oxygen   [] Productive cough   [] Hemoptysis   [] Wheeze  [] COPD   [] Asthma Neurologic:  [] Dizziness  [] Blackouts   [] Seizures   [] History of stroke   [] History of TIA  [] Aphasia   [] Temporary blindness   [] Dysphagia   [] Weakness or numbness in arms   [] Weakness or numbness in legs Musculoskeletal:  [] Arthritis   [] Joint swelling   [x] Joint pain   [] Low back pain Hematologic:  [] Easy bruising  [] Easy bleeding   [] Hypercoagulable state   [] Anemic   Gastrointestinal:  [] Blood in stool   [] Vomiting blood  [] Gastroesophageal reflux/heartburn   [] Abdominal pain Genitourinary:  [] Chronic kidney disease   [] Difficult urination  [] Frequent urination  [] Burning with urination   [] Hematuria Skin:  [] Rashes   [] Ulcers   [] Wounds Psychological:  [] History of anxiety   []  History of major depression.  Physical Examination  BP 133/69 (BP Location: Left Arm)   Pulse 67   Resp 17   Ht 6' (1.829 m)   Wt 267 lb 6.4 oz (121.3 kg)   BMI 36.27 kg/m  Gen:  WD/WN, NAD Head: [place]/AT, No temporalis wasting. Ear/Nose/Throat: Hearing grossly intact, nares w/o erythema or drainage Eyes: Conjunctiva clear. Sclera non-icteric Neck: Supple.  Trachea midline Pulmonary:  Good air movement, no use of accessory muscles.  Cardiac: irregular Vascular:  Vessel Right Left  Radial Palpable Palpable                          PT Palpable Palpable  DP Palpable Palpable   Gastrointestinal: soft, non-tender/non-distended. No guarding/reflex.  Musculoskeletal: M/S 5/5 throughout.  No deformity or atrophy. No edema. Neurologic: Sensation grossly intact in extremities.  Symmetrical.  Speech is fluent.  Psychiatric: Judgment intact, Mood & affect appropriate for pt's clinical situation. Dermatologic: No rashes or ulcers noted.  No cellulitis or open wounds.      Labs Recent Results (from the past 2160 hour(s))  VAS [place] ABI  WITH/WO TBI     Status: None   Collection Time: 10/10/22  1:23 PM  Result Value Ref Range   Right ABI 1.01    Left ABI 1.04   Magnesium     Status: None   Collection Time: 10/28/22  4:22 PM  Result Value Ref Range   Magnesium 2.1 1.6 - 2.3 mg/dL  Basic Metabolic Panel (BMET)     Status: Abnormal   Collection Time: 10/28/22  4:22 PM  Result Value Ref Range   Glucose 85 70 - 99 mg/dL   BUN 19 6 - 24 mg/dL   Creatinine, Ser 4.09 (H) 0.76 -  1.27 mg/dL   eGFR 49 (L) >52 WU/XLK/4.40   BUN/Creatinine Ratio 12 9 - 20   Sodium 138 134 - 144 mmol/L   Potassium 4.2 3.5 - 5.2 mmol/L   Chloride 102 96 - 106 mmol/L   CO2 20 20 - 29 mmol/L   Calcium 9.6 8.7 - 10.2 mg/dL    Radiology VAS US AORTA/IVC/ILIACS  Result Date: 10/17/2022 ABDOMINAL AORTA STUDY Patient Name:  TALLIS KEMNA  Date of Exam:   10/16/2022 Medical Rec #: [id number]        Accession #:    [id number] Date of Birth: 1965/12/02        Patient Gender: M Patient Age:   61 years Exam Location:  [facility] Procedure:      VAS US AORTA/IVC/ILIACS Referring Phys: Sheppard Plumber --------------------------------------------------------------------------------  Indications: Pain in Thighs and Claudication  Performing Technologist: Debbe Bales RVS  Examination Guidelines: A complete evaluation includes B-mode imaging, spectral Doppler, color Doppler, and power Doppler as needed of all accessible portions of each vessel. Bilateral testing is considered an integral part of a complete examination. Limited examinations for reoccurring indications may be performed as noted.  Abdominal Aorta Findings: +-------------+-------+----------+----------+----------+--------+--------+ Location     AP (cm)Trans (cm)PSV (cm/s)Waveform  ThrombusComments +-------------+-------+----------+----------+----------+--------+--------+ Proximal     1.79   1.72      73        monophasic                  +-------------+-------+----------+----------+----------+--------+--------+ Mid          1.99   2.03      82        monophasic                 +-------------+-------+----------+----------+----------+--------+--------+ Distal       1.72   1.72      69        monophasic                 +-------------+-------+----------+----------+----------+--------+--------+ RT CIA Prox  1.1    1.2       120       biphasic                   +-------------+-------+----------+----------+----------+--------+--------+ RT CIA Distal1.2    1.2       168       biphasic                   +-------------+-------+----------+----------+----------+--------+--------+ RT EIA Prox  1.0    0.8       74        triphasic                  +-------------+-------+----------+----------+----------+--------+--------+ RT EIA Distal0.8    0.8       112       triphasic                  +-------------+-------+----------+----------+----------+--------+--------+ LT CIA Prox  1.3    1.2       117       biphasic                   +-------------+-------+----------+----------+----------+--------+--------+ LT EIA Prox  1.0    1.2                                            +-------------+-------+----------+----------+----------+--------+--------+  LT EIA Distal0.9    0.9       113       triphasic                  +-------------+-------+----------+----------+----------+--------+--------+  Summary: Abdominal Aorta: The largest aortic measurement is 2.0 cm. Stenosis: Imaging and Waveforms obtained of the Aorta, CIA and EIA. No evid  *See table(s) above for measurements and observations.  Electronically signed by Festus Barren MD on 10/17/2022 at 12:14:30 PM.    Final    VAS [place] ABI WITH/WO TBI  Result Date: 10/15/2022  LOWER EXTREMITY DOPPLER STUDY Patient Name:  SHREE KERNER  Date of Exam:   10/10/2022 Medical Rec #: [id number]        Accession #:    [id number] Date of Birth: [birth year]        Patient  Gender: M Patient Age:   13 years Exam Location:  [facility] Procedure:      VAS [place] ABI WITH/WO TBI Referring Phys: Sheppard Plumber --------------------------------------------------------------------------------  High Risk Factors: Hypertension, past history of smoking, prior MI, coronary                    artery disease. Other Factors: Family history PAD.  Performing Technologist: Hardie Lora RVT  Examination Guidelines: A complete evaluation includes at minimum, Doppler waveform signals and systolic blood pressure reading at the level of bilateral brachial, anterior tibial, and posterior tibial arteries, when vessel segments are accessible. Bilateral testing is considered an integral part of a complete examination. Photoelectric Plethysmograph (PPG) waveforms and toe systolic pressure readings are included as required and additional duplex testing as needed. Limited examinations for reoccurring indications may be performed as noted.  ABI Findings: +---------+------------------+-----+---------+--------+ Right    Rt Pressure (mmHg)IndexWaveform Comment  +---------+------------------+-----+---------+--------+ Brachial 136                                      +---------+------------------+-----+---------+--------+ PTA      137               1.01 triphasic         +---------+------------------+-----+---------+--------+ DP       132               0.97 biphasic          +---------+------------------+-----+---------+--------+ Great Toe82                0.60                   +---------+------------------+-----+---------+--------+ +---------+------------------+-----+---------+-------+ Left     Lt Pressure (mmHg)IndexWaveform Comment +---------+------------------+-----+---------+-------+ Brachial 131                                     +---------+------------------+-----+---------+-------+ PTA      142               1.04 triphasic         +---------+------------------+-----+---------+-------+ DP       126               0.93 biphasic         +---------+------------------+-----+---------+-------+ Great Toe100               0.74                  +---------+------------------+-----+---------+-------+ +-------+-----------+-----------+------------+------------+  ABI/TBIToday's ABIToday's TBIPrevious ABIPrevious TBI +-------+-----------+-----------+------------+------------+ Right  1.01       0.60                                +-------+-----------+-----------+------------+------------+ Left   1.04       0.74                                +-------+-----------+-----------+------------+------------+  Summary: Right: Resting right ankle-brachial index is within normal range. The right toe-brachial index is normal. Left: Resting left ankle-brachial index is within normal range. The left toe-brachial index is normal. *See table(s) above for measurements and observations.  Electronically signed by Levora Dredge MD on 10/15/2022 at 4:26:52 PM.    Final     Assessment/Plan  Claudication [facility]) Since his last visit, he has undergone aortic duplex which did not show any aortoiliac aneurysm or occlusive disease of significance.  His ABIs were fairly normal initially. 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  I have told him I cannot say that with 100% certainty without an angiogram, but I think the yield from an angiogram would be relatively low.  He is already on appropriate medical therapy with Eliquis, aspirin, and Lipitor for any vascular disease.  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  Primary hypertension blood pressure control important in reducing the progression of atherosclerotic disease. On appropriate oral medications.   Atrial fibrillation (HCC) On anticoagulation    Festus Barren, MD  10/29/2022 8:48  AM    This note was created with Dragon medical transcription system.  Any errors from dictation are purely unintentional

## 2022-10-28 NOTE — Patient Instructions (Signed)
Get compression socks and put them on every morning with removal at bedtime 

## 2022-10-28 NOTE — Progress Notes (Signed)
   10/28/22 1555  ReDS Vest / Clip  Station Marker D  Ruler Value 41  ReDS Value Range 36 - 40  ReDS Actual Value 36

## 2022-10-28 NOTE — Assessment & Plan Note (Signed)
On anticoagulation 

## 2022-10-28 NOTE — Progress Notes (Signed)
PCP: [facility] (last seen 06/24) Primary Cardiologist: Dorothyann Peng, MD (04/24 returns 10/24)  HPI:  Andres [name] is a 57 y/o male with a history of CAD, MI s/p PCI/DES to prox-mid LAD (2013), aortic atherosclerosis, atrial fibrillation s/p cardioversion (01/13/2022), NSVT, hyperlipidemia, psoriasis, questionable to small PFO (noted on TEE in 12/2021), kidney stones, tobacco use and chronic heart failure.   Admitted 01/05/22 due to SOB/ chest pain. Needed 5L oxygen due to hypoxia. CXR showed pulmonary edema and lasix started. Cardiology consult obtained. Patient underwent left heart catheterization on 01/06/2022 which shows Diffuse moderate nonobstructive coronary artery disease with patent stent proximal/mid LAD, 50% stenosis proximal LAD, 50% stenosis proximal left circumflex, 40% distal RCA.  Widely patent previously placed stent in LAD. Short runs of NSVT & intermittent AF/RVR and metoprolol increased. Weaned off oxygen. Discharged after 3 days. Admitted 01/10/22 due to SOB with AF RVR. Cardiology consult obtained. Started on diltiazem gtt which was stopped after 1 day. Echo done with successful cardioversion. CXR showed pulmonary edema. Given metolazone and IV lasix. Transitioned to oral lasix with potassium. Discharged after 3 days. Was in the ED 01/16/22 due to hypokalemia with a potassium of 2.5 reported by his PCP. EKG Rate: 54  Rhythm: normal EKG, normal sinus rhythm  Axis: nl Intervals:long qtc 600, no ischemic changes, min ST depressions in lateral leads. A re-check of his ECG shows. Was in the ED 01/19/22 due to acute on chronic HF where he was treated and released. resolution of prior QTC prolongation and lateral STD. IV/PO potassium given and he was released. Admitted 01/20/22 due to shortness of breath, weight gain and pedal edema. Initially given IV lasix with transition to or diuretics. Cardiology consult obtained. Discharged after 2 days.   Echo 01/07/22: EF 65-70% with mild  LAE and moderate Andres.  TEE 01/13/22: EF 55-60%, moderate LAE, NO thrombus, moderate grade III plaque, can not exclude small PFO. Successful direct current cardioversion.    LHC and angiography 01/06/22:   Prox RCA lesion is 20% stenosed.   Mid RCA lesion is 20% stenosed.   Dist RCA lesion is 40% stenosed.   RPAV lesion is 40% stenosed.   Prox Cx lesion is 50% stenosed.   2nd Mrg lesion is 50% stenosed.   1st Mrg lesion is 30% stenosed.   Prox LAD lesion is 50% stenosed.   1st Diag lesion is 60% stenosed.   Previously placed Prox LAD to Mid LAD stent of unknown type is  widely patent.   The left ventricular systolic function is normal.   The left ventricular ejection fraction is 50-55% by visual estimate.  1.  Diffuse moderate nonobstructive coronary artery disease with patent stent proximal/mid LAD, 50% stenosis proximal LAD, 50% stenosis proximal left circumflex, 40% distal RCA 2.  Normal left ventricular function with estimated LVEF 50-55%  He presents today for a HF follow-up visit with a chief complaint of minimal fatigue with moderate exertion. Chronic in nature although improved from last visit. Has associated minimal pedal edema along with this. Denies SOB, chest pain, cough, palpitations, abdominal distention, dizziness or difficulty sleeping. Continues to have some intermittent cramping in his thighs.   For 3 days after last visit, he was to increase torsemide to 80mg  daily/ potassium daily and then resume his torsemide 40mg  daily/ potassium daily. He says that during the 3 days he took the increased dose, he didn't feel good but now back on his regular dose, he's "feeling good"  ROS: All  systems negative except as listed in HPI, PMH and Problem List.  SH:  Social History   Socioeconomic History   Marital status: Married    Spouse name: Not on file   Number of children: Not on file   Years of education: Not on file   Highest education level: Not on file   Occupational History   Not on file  Tobacco Use   Smoking status: Former    Current packs/day: 0.00    Types: Cigarettes    Quit date: 01/04/2022    Years since quitting: 0.8   Smokeless tobacco: Not on file  Vaping Use   Vaping status: Never Used  Substance and Sexual Activity   Alcohol use: Not Currently   Drug use: Never   Sexual activity: Not on file  Other Topics Concern   Not on file  Social History Narrative   Not on file   Social Determinants of Health   Financial Resource Strain: Low Risk  (06/03/2022)   Received from [facility], [facility]   Overall Financial Resource Strain (CARDIA)    Difficulty of Paying Living Expenses: Not hard at all  Food Insecurity: Unknown (06/03/2022)   Received from [facility], [facility]   Hunger Vital Sign    Worried About Running Out of Food in the Last Year: Never true    Ran Out of Food in the Last Year: Not on file  Transportation Needs: Unknown (06/03/2022)   Received from [facility], [facility]   [facility] - Transportation    In the past 12 months, has lack of transportation kept you from medical appointments or from getting medications?: No    Lack of Transportation (Non-Medical): Not on file  Physical Activity: Not on file  Stress: Not on file  Social Connections: Not on file  Intimate Partner Violence: Not At Risk (01/21/2022)   Humiliation, Afraid, Rape, and Kick questionnaire    Fear of Current or Ex-Partner: No    Emotionally Abused: No    Physically Abused: No    Sexually Abused: No   FH: No family history on file.  Past Medical History:  Diagnosis Date   A-fib (HCC)    CHF (congestive heart failure) (HCC)    Coronary artery disease    History of kidney stones     Current Outpatient Medications  Medication Sig Dispense Refill   amiodarone (PACERONE) 200 MG tablet Take 0.5 tablets (100 mg total) by  mouth daily. 15 tablet 5   aspirin EC 81 MG tablet Take 1 tablet (81 mg total) by mouth daily. Swallow whole. 30 tablet 12   atorvastatin (LIPITOR) 40 MG tablet Take 1 tablet (40 mg total) by mouth every evening. 30 tablet 5   Cyanocobalamin 1000 MCG TBCR Take 1 tablet by mouth daily.     ELIQUIS 5 MG TABS tablet TAKE 1 TABLET BY MOUTH TWICE DAILY 60 tablet 5   metoprolol tartrate (LOPRESSOR) 25 MG tablet Take 0.5 tablets (12.5 mg total) by mouth 2 (two) times daily. 30 tablet 5   nitroGLYCERIN (NITROSTAT) 0.4 MG SL tablet  (Patient not taking: Reported on 10/16/2022)     potassium chloride SA (KLOR-CON M) 20 MEQ tablet Take 1 tablet (20 mEq total) by mouth daily. 90 tablet 3   sacubitril-valsartan (ENTRESTO) 24-26 MG Take 1 tablet by mouth 2 (two) times daily. 60 tablet 5   spironolactone (ALDACTONE) 25 MG tablet Take 1 tablet (  25 mg total) by mouth daily. 30 tablet 6   Torsemide 40 MG TABS Take 20 mg by mouth daily. And additional 20mg  if needed for weight gain, swelling (Patient taking differently: Take 40 mg by mouth daily. And additional 20mg  if needed for weight gain, swelling) 60 tablet 5   No current facility-administered medications for this visit.   Vitals:   10/28/22 1547  BP: (!) 144/69  Pulse: 72  SpO2: 98%  Weight: 268 lb (121.6 kg)   Wt Readings from Last 3 Encounters:  10/28/22 268 lb (121.6 kg)  10/28/22 267 lb 6.4 oz (121.3 kg)  10/16/22 266 lb (120.7 kg)   Lab Results  Component Value Date   CREATININE 1.32 (H) 07/16/2022   CREATININE 2.06 (H) 02/12/2022   CREATININE 1.73 (H) 01/29/2022   PHYSICAL EXAM:  General:  Well appearing. No resp difficulty HEENT: normal Neck: supple. JVP flat. No lymphadenopathy or thryomegaly appreciated. Cor: PMI normal. Regular rate & rhythm. No rubs, gallops or murmurs. Lungs: clear Abdomen: soft, nontender, nondistended. No hepatosplenomegaly. No bruits or masses.  Extremities: no cyanosis, clubbing, rash, trace pitting edema  bilateral lower legs Neuro: alert & oriented x3, cranial nerves grossly intact. Moves all 4 extremities w/o difficulty. Affect pleasant.   ECG: not done   ReDs: 36%   ASSESSMENT & PLAN:  1: Ischemic heart failure with preserved ejection fraction- - NYHA class II - euvolemic - weighing daily; reminded to call for an overnight weight gain of > 2 pounds or a weekly weight gain of > 5 pounds - weight up 2 pounds from last visit here 2 weeks ago - ReDs today is 36%; 2 weeks ago it was 39% - Echo 01/07/22: EF 65-70% with mild LAE and moderate Andres.  - TEE 01/13/22: EF 55-60%, moderate LAE, NO thrombus, moderate grade III plaque, can not exclude small PFO. Successful direct current cardioversion.   - continue entresto 24/26mg  BID - continue metoprolol tartrate 12.5mg  BID - continue spironolactone 25mg  daily - continue torsemide 40mg  daily/ potassium daily - not terribly interested in SGLT2 as he says that he already takes "a lot" of meds; will continue to discuss - BMP & Mg today - get compression socks and put them on every morning with removal at bedtime - BNP 01/20/22 was 937.3  2: Atrial fibrillation- - continue amiodarone 100mg  daily - continue apixaban 5mg  BID - continue metoprolol tartrate 12.5mg  BID - saw cardiology Andres [name]) 04/24; returns 10/24  3: Hypokalemia- - saw PCP @ [facility] 06/24 - BMP 07/16/22 reviewed and showed sodium 136, potassium 4.3, creatinine 1.9 and GFR 41 - BMP today  4: CAD- - previous stent placement in LAD in 2013 - LHC and angiography 01/06/22:   Prox RCA lesion is 20% stenosed.   Mid RCA lesion is 20% stenosed.   Dist RCA lesion is 40% stenosed.   RPAV lesion is 40% stenosed.   Prox Cx lesion is 50% stenosed.   2nd Mrg lesion is 50% stenosed.   1st Mrg lesion is 30% stenosed.   Prox LAD lesion is 50% stenosed.   1st Diag lesion is 60% stenosed.   Previously placed Prox LAD to Mid LAD stent of unknown type is  widely patent.    The left ventricular systolic function is normal.   The left ventricular ejection fraction is 50-55% by visual estimate.  1.  Diffuse moderate nonobstructive coronary artery disease with patent stent proximal/mid LAD, 50% stenosis proximal LAD, 50% stenosis proximal left circumflex,  40% distal RCA 2.  Normal left ventricular function with estimated LVEF 50-55%  5: COPD- - saw pulmonology Andres [name]) 05/24 - PFT 08/04/22   Return in 1 month, sooner if needed.

## 2022-10-28 NOTE — Assessment & Plan Note (Signed)
Since his last visit, he has undergone aortic duplex which did not show any aortoiliac aneurysm or occlusive disease of significance.  His ABIs were fairly normal initially. 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  I have told him I cannot say that with 100% certainty without an angiogram, but I think the yield from an angiogram would be relatively low.  He is already on appropriate medical therapy with Eliquis, aspirin, and Lipitor for any vascular disease.  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

## 2022-10-29 ENCOUNTER — Encounter: Payer: Self-pay | Admitting: Family

## 2022-11-10 DIAGNOSIS — I255 Ischemic cardiomyopathy: Secondary | ICD-10-CM | POA: Diagnosis not present

## 2022-11-10 DIAGNOSIS — I5032 Chronic diastolic (congestive) heart failure: Secondary | ICD-10-CM | POA: Diagnosis not present

## 2022-11-10 DIAGNOSIS — I2510 Atherosclerotic heart disease of native coronary artery without angina pectoris: Secondary | ICD-10-CM | POA: Diagnosis not present

## 2022-11-10 DIAGNOSIS — I10 Essential (primary) hypertension: Secondary | ICD-10-CM | POA: Diagnosis not present

## 2022-11-26 ENCOUNTER — Encounter: Admission: RE | Payer: Self-pay | Source: Home / Self Care

## 2022-11-26 ENCOUNTER — Ambulatory Visit
Admission: RE | Admit: 2022-11-26 | Payer: BC Managed Care – PPO | Source: Home / Self Care | Admitting: Internal Medicine

## 2022-11-26 SURGERY — COLONOSCOPY WITH PROPOFOL
Anesthesia: General

## 2022-11-28 ENCOUNTER — Encounter: Payer: BC Managed Care – PPO | Admitting: Family

## 2022-12-08 DIAGNOSIS — Z79899 Other long term (current) drug therapy: Secondary | ICD-10-CM | POA: Diagnosis not present

## 2022-12-08 DIAGNOSIS — I509 Heart failure, unspecified: Secondary | ICD-10-CM | POA: Diagnosis not present

## 2022-12-08 DIAGNOSIS — R739 Hyperglycemia, unspecified: Secondary | ICD-10-CM | POA: Diagnosis not present

## 2022-12-08 DIAGNOSIS — E7801 Familial hypercholesterolemia: Secondary | ICD-10-CM | POA: Diagnosis not present

## 2022-12-10 DIAGNOSIS — I10 Essential (primary) hypertension: Secondary | ICD-10-CM | POA: Diagnosis not present

## 2022-12-10 DIAGNOSIS — I2510 Atherosclerotic heart disease of native coronary artery without angina pectoris: Secondary | ICD-10-CM | POA: Diagnosis not present

## 2022-12-10 DIAGNOSIS — I5032 Chronic diastolic (congestive) heart failure: Secondary | ICD-10-CM | POA: Diagnosis not present

## 2022-12-10 DIAGNOSIS — I252 Old myocardial infarction: Secondary | ICD-10-CM | POA: Diagnosis not present

## 2022-12-10 NOTE — Progress Notes (Deleted)
PCP: [facility] (last seen 09/24) Primary Cardiologist: Dorothyann Peng, MD (08/24)  HPI:  Mr Andres [name] is a 57 y/o male with a history of CAD, MI s/p PCI/DES to prox-mid LAD (2013), aortic atherosclerosis, atrial fibrillation s/p cardioversion (01/13/2022), NSVT, hyperlipidemia, psoriasis, questionable to small PFO (noted on TEE in 12/2021), kidney stones, tobacco use and chronic heart failure.   Admitted 01/05/22 due to SOB/ chest pain. Needed 5L oxygen due to hypoxia. CXR showed pulmonary edema and lasix started. Cardiology consult obtained. Patient underwent left heart catheterization on 01/06/2022 which shows Diffuse moderate nonobstructive coronary artery disease with patent stent proximal/mid LAD, 50% stenosis proximal LAD, 50% stenosis proximal left circumflex, 40% distal RCA.  Widely patent previously placed stent in LAD. Short runs of NSVT & intermittent AF/RVR and metoprolol increased. Weaned off oxygen. Discharged after 3 days. Admitted 01/10/22 due to SOB with AF RVR. Cardiology consult obtained. Started on diltiazem gtt which was stopped after 1 day. Echo done with successful cardioversion. CXR showed pulmonary edema. Given metolazone and IV lasix. Transitioned to oral lasix with potassium. Discharged after 3 days. Was in the ED 01/16/22 due to hypokalemia with a potassium of 2.5 reported by his PCP. EKG Rate: 54  Rhythm: normal EKG, normal sinus rhythm  Axis: nl Intervals:long qtc 600, no ischemic changes, min ST depressions in lateral leads. A re-check of his ECG shows. Was in the ED 01/19/22 due to acute on chronic HF where he was treated and released. resolution of prior QTC prolongation and lateral STD. IV/PO potassium given and he was released. Admitted 01/20/22 due to shortness of breath, weight gain and pedal edema. Initially given IV lasix with transition to or diuretics. Cardiology consult obtained. Discharged after 2 days.   Echo 01/07/22: EF 65-70% with mild LAE and moderate  MR.  TEE 01/13/22: EF 55-60%, moderate LAE, NO thrombus, moderate grade III plaque, can not exclude small PFO. Successful direct current cardioversion.    LHC and angiography 01/06/22:   Prox RCA lesion is 20% stenosed.   Mid RCA lesion is 20% stenosed.   Dist RCA lesion is 40% stenosed.   RPAV lesion is 40% stenosed.   Prox Cx lesion is 50% stenosed.   2nd Mrg lesion is 50% stenosed.   1st Mrg lesion is 30% stenosed.   Prox LAD lesion is 50% stenosed.   1st Diag lesion is 60% stenosed.   Previously placed Prox LAD to Mid LAD stent of unknown type is  widely patent.   The left ventricular systolic function is normal.   The left ventricular ejection fraction is 50-55% by visual estimate.  1.  Diffuse moderate nonobstructive coronary artery disease with patent stent proximal/mid LAD, 50% stenosis proximal LAD, 50% stenosis proximal left circumflex, 40% distal RCA 2.  Normal left ventricular function with estimated LVEF 50-55%  He presents today for a HF follow-up visit with a chief complaint of minimal fatigue with moderate exertion. Chronic in nature although improved from last visit. Has associated minimal pedal edema along with this. Denies SOB, chest pain, cough, palpitations, abdominal distention, dizziness or difficulty sleeping. Continues to have some intermittent cramping in his thighs.   For 3 days after last visit, he was to increase torsemide to 80mg  daily/ potassium daily and then resume his torsemide 40mg  daily/ potassium daily. He says that during the 3 days he took the increased dose, he didn't feel good but now back on his regular dose, he's "feeling good"  ROS: All systems negative  except as listed in HPI, PMH and Problem List.  SH:  Social History   Socioeconomic History   Marital status: Married    Spouse name: Not on file   Number of children: Not on file   Years of education: Not on file   Highest education level: Not on file  Occupational History    Not on file  Tobacco Use   Smoking status: Former    Current packs/day: 0.00    Types: Cigarettes    Quit date: 01/04/2022    Years since quitting: 0.9   Smokeless tobacco: Not on file  Vaping Use   Vaping status: Never Used  Substance and Sexual Activity   Alcohol use: Not Currently   Drug use: Never   Sexual activity: Not on file  Other Topics Concern   Not on file  Social History Narrative   Not on file   Social Determinants of Health   Financial Resource Strain: Low Risk  (06/03/2022)   Received from [facility], [facility]   Overall Financial Resource Strain (CARDIA)    Difficulty of Paying Living Expenses: Not hard at all  Food Insecurity: Unknown (06/03/2022)   Received from [facility], [facility]   Hunger Vital Sign    Worried About Running Out of Food in the Last Year: Never true    Ran Out of Food in the Last Year: Not on file  Transportation Needs: Unknown (06/03/2022)   Received from [facility], [facility]   [facility] - Transportation    In the past 12 months, has lack of transportation kept you from medical appointments or from getting medications?: No    Lack of Transportation (Non-Medical): Not on file  Physical Activity: Not on file  Stress: Not on file  Social Connections: Not on file  Intimate Partner Violence: Not At Risk (01/21/2022)   Humiliation, Afraid, Rape, and Kick questionnaire    Fear of Current or Ex-Partner: No    Emotionally Abused: No    Physically Abused: No    Sexually Abused: No   FH:  Family History  Problem Relation Age of Onset   Stroke Father    Cancer Paternal Aunt    Cancer Paternal Aunt    Stroke Paternal Uncle    Aneurysm Maternal Grandmother    Stroke Paternal Grandmother    Heart disease Paternal Grandfather     Past Medical History:  Diagnosis Date   A-fib (HCC)    CHF (congestive heart failure) (HCC)     Coronary artery disease    History of kidney stones     Current Outpatient Medications  Medication Sig Dispense Refill   amiodarone (PACERONE) 200 MG tablet Take 0.5 tablets (100 mg total) by mouth daily. 15 tablet 5   aspirin EC 81 MG tablet Take 1 tablet (81 mg total) by mouth daily. Swallow whole. 30 tablet 12   atorvastatin (LIPITOR) 40 MG tablet Take 1 tablet (40 mg total) by mouth every evening. 30 tablet 5   Cyanocobalamin 1000 MCG TBCR Take 1 tablet by mouth daily.     ELIQUIS 5 MG TABS tablet TAKE 1 TABLET BY MOUTH TWICE DAILY 60 tablet 5   metoprolol tartrate (LOPRESSOR) 25 MG tablet Take 0.5 tablets (12.5 mg total) by mouth 2 (two) times daily. 30 tablet 5   nitroGLYCERIN (NITROSTAT) 0.4 MG SL tablet Place 1 tablet (0.4 mg total) under the tongue every 5 (five) minutes  as needed for chest pain. 20 tablet 3   potassium chloride SA (KLOR-CON M) 20 MEQ tablet Take 1 tablet (20 mEq total) by mouth daily. 90 tablet 3   sacubitril-valsartan (ENTRESTO) 24-26 MG Take 1 tablet by mouth 2 (two) times daily. 60 tablet 5   spironolactone (ALDACTONE) 25 MG tablet Take 1 tablet (25 mg total) by mouth daily. 30 tablet 6   Torsemide 40 MG TABS Take 20 mg by mouth daily. And additional 20mg  if needed for weight gain, swelling 60 tablet 5   No current facility-administered medications for this visit.     PHYSICAL EXAM:  General:  Well appearing. No resp difficulty HEENT: normal Neck: supple. JVP flat. No lymphadenopathy or thryomegaly appreciated. Cor: PMI normal. Regular rate & rhythm. No rubs, gallops or murmurs. Lungs: clear Abdomen: soft, nontender, nondistended. No hepatosplenomegaly. No bruits or masses.  Extremities: no cyanosis, clubbing, rash, trace pitting edema bilateral lower legs Neuro: alert & oriented x3, cranial nerves grossly intact. Moves all 4 extremities w/o difficulty. Affect pleasant.   ECG: not done      ASSESSMENT & PLAN:  1: Ischemic heart failure with  preserved ejection fraction- - NYHA class II - euvolemic - weighing daily; reminded to call for an overnight weight gain of > 2 pounds or a weekly weight gain of > 5 pounds - ReDs today is    ; 6 weeks ago it was 36% - Echo 01/07/22: EF 65-70% with mild LAE and moderate MR.  - TEE 01/13/22: EF 55-60%, moderate LAE, NO thrombus, moderate grade III plaque, can not exclude small PFO. Successful direct current cardioversion.   - continue entresto 24/26mg  BID - continue metoprolol tartrate 12.5mg  BID - continue spironolactone 25mg  daily - continue torsemide 40mg  daily/ potassium daily - not terribly interested in SGLT2 as he says that he already takes "a lot" of meds; will continue to discuss - get compression socks and put them on every morning with removal at bedtime - BNP 01/20/22 was 937.3  2: Atrial fibrillation- - continue amiodarone 100mg  daily - continue apixaban 5mg  BID - continue metoprolol tartrate 12.5mg  BID - saw cardiology Juliann Pares) 08/24  3: Hypokalemia- - saw PCP @ [facility] 09/24 - BMP 12/08/22 reviewed and showed sodium 136, potassium 3.7, creatinine 1.5 and GFR 54 - BMP today  4: CAD- - previous stent placement in LAD in 2013 - LHC and angiography 01/06/22:   Prox RCA lesion is 20% stenosed.   Mid RCA lesion is 20% stenosed.   Dist RCA lesion is 40% stenosed.   RPAV lesion is 40% stenosed.   Prox Cx lesion is 50% stenosed.   2nd Mrg lesion is 50% stenosed.   1st Mrg lesion is 30% stenosed.   Prox LAD lesion is 50% stenosed.   1st Diag lesion is 60% stenosed.   Previously placed Prox LAD to Mid LAD stent of unknown type is  widely patent.   The left ventricular systolic function is normal.   The left ventricular ejection fraction is 50-55% by visual estimate.  1.  Diffuse moderate nonobstructive coronary artery disease with patent stent proximal/mid LAD, 50% stenosis proximal LAD, 50% stenosis proximal left circumflex, 40% distal RCA 2.  Normal  left ventricular function with estimated LVEF 50-55%  5: COPD- - saw pulmonology Meredeth Ide) 05/24 - PFT 08/04/22

## 2022-12-11 ENCOUNTER — Telehealth: Payer: Self-pay | Admitting: Family

## 2022-12-11 ENCOUNTER — Encounter: Payer: BC Managed Care – PPO | Admitting: Family

## 2022-12-11 NOTE — Telephone Encounter (Signed)
Patient did not show for his [facility] appointment on 12/11/22

## 2022-12-18 NOTE — Progress Notes (Signed)
PCP: [facility] (last seen 09/24) Primary Cardiologist: Dorothyann Peng, MD (09/24)  HPI:  Mr Biegler is a 57 y/o male with a history of CAD, MI s/p PCI/DES to prox-mid LAD (2013), aortic atherosclerosis, atrial fibrillation s/p cardioversion (01/13/2022), NSVT, hyperlipidemia, psoriasis, questionable to small PFO (noted on TEE in 12/2021), kidney stones, tobacco use and chronic heart failure.   Admitted 01/05/22 due to SOB/ chest pain. Needed 5L oxygen due to hypoxia. CXR showed pulmonary edema and lasix started. Cardiology consult obtained. Patient underwent left heart catheterization on 01/06/2022 which shows Diffuse moderate nonobstructive coronary artery disease with patent stent proximal/mid LAD, 50% stenosis proximal LAD, 50% stenosis proximal left circumflex, 40% distal RCA.  Widely patent previously placed stent in LAD. Short runs of NSVT & intermittent AF/RVR and metoprolol increased. Weaned off oxygen. Discharged after 3 days. Admitted 01/10/22 due to SOB with AF RVR. Cardiology consult obtained. Started on diltiazem gtt which was stopped after 1 day. Echo done with successful cardioversion. CXR showed pulmonary edema. Given metolazone and IV lasix. Transitioned to oral lasix with potassium. Discharged after 3 days. Was in the ED 01/16/22 due to hypokalemia with a potassium of 2.5 reported by his PCP. EKG Rate: 54  Rhythm: normal EKG, normal sinus rhythm  Axis: nl Intervals:long qtc 600, no ischemic changes, min ST depressions in lateral leads. A re-check of his ECG shows. Was in the ED 01/19/22 due to acute on chronic HF where he was treated and released. resolution of prior QTC prolongation and lateral STD. IV/PO potassium given and he was released. Admitted 01/20/22 due to shortness of breath, weight gain and pedal edema. Initially given IV lasix with transition to or diuretics. Cardiology consult obtained. Discharged after 2 days.   Echo 01/07/22: EF 65-70% with mild LAE and moderate  MR.  TEE 01/13/22: EF 55-60%, moderate LAE, NO thrombus, moderate grade III plaque, can not exclude small PFO. Successful direct current cardioversion.    LHC and angiography 01/06/22:   Prox RCA lesion is 20% stenosed.   Mid RCA lesion is 20% stenosed.   Dist RCA lesion is 40% stenosed.   RPAV lesion is 40% stenosed.   Prox Cx lesion is 50% stenosed.   2nd Mrg lesion is 50% stenosed.   1st Mrg lesion is 30% stenosed.   Prox LAD lesion is 50% stenosed.   1st Diag lesion is 60% stenosed.   Previously placed Prox LAD to Mid LAD stent of unknown type is  widely patent.   The left ventricular systolic function is normal.   The left ventricular ejection fraction is 50-55% by visual estimate.  1.  Diffuse moderate nonobstructive coronary artery disease with patent stent proximal/mid LAD, 50% stenosis proximal LAD, 50% stenosis proximal left circumflex, 40% distal RCA 2.  Normal left ventricular function with estimated LVEF 50-55%  He presents today for a HF follow-up visit with a chief complaint of minimal SOB with moderate exertion. Chronic in nature. Has associated fatigue, minimal pedal edema, slight weight gain and intermittent trouble sleeping along with this. Denies chest pain, cough, palpitations, abdominal distention or dizziness. Not adding salt to his food and tries to eat low sodium foods.   Saw cardiology last week and lipitor was stopped and zetia, rosuvastatin and amiodarone were started. Levothyroxine has also been added by his PCP due to elevated TSH.    ROS: All systems negative except as listed in HPI, PMH and Problem List.  SH:  Social History   Socioeconomic History  Marital status: Married    Spouse name: Not on file   Number of children: Not on file   Years of education: Not on file   Highest education level: Not on file  Occupational History   Not on file  Tobacco Use   Smoking status: Former    Current packs/day: 0.00    Types: Cigarettes    Quit date:  01/04/2022    Years since quitting: 0.9   Smokeless tobacco: Not on file  Vaping Use   Vaping status: Never Used  Substance and Sexual Activity   Alcohol use: Not Currently   Drug use: Never   Sexual activity: Not on file  Other Topics Concern   Not on file  Social History Narrative   Not on file   Social Determinants of Health   Financial Resource Strain: Low Risk  (06/03/2022)   Received from [facility], [facility]   Overall Financial Resource Strain (CARDIA)    Difficulty of Paying Living Expenses: Not hard at all  Food Insecurity: Unknown (06/03/2022)   Received from [facility], [facility]   Hunger Vital Sign    Worried About Running Out of Food in the Last Year: Never true    Ran Out of Food in the Last Year: Not on file  Transportation Needs: Unknown (06/03/2022)   Received from [facility], [facility]   [facility] - Transportation    In the past 12 months, has lack of transportation kept you from medical appointments or from getting medications?: No    Lack of Transportation (Non-Medical): Not on file  Physical Activity: Not on file  Stress: Not on file  Social Connections: Not on file  Intimate Partner Violence: Not At Risk (01/21/2022)   Humiliation, Afraid, Rape, and Kick questionnaire    Fear of Current or Ex-Partner: No    Emotionally Abused: No    Physically Abused: No    Sexually Abused: No   FH:  Family History  Problem Relation Age of Onset   Stroke Father    Cancer Paternal Aunt    Cancer Paternal Aunt    Stroke Paternal Uncle    Aneurysm Maternal Grandmother    Stroke Paternal Grandmother    Heart disease Paternal Grandfather     Past Medical History:  Diagnosis Date   A-fib (HCC)    CHF (congestive heart failure) (HCC)    Coronary artery disease    History of kidney stones     Current Outpatient Medications  Medication Sig Dispense  Refill   amiodarone (PACERONE) 200 MG tablet Take 0.5 tablets (100 mg total) by mouth daily. 15 tablet 5   aspirin EC 81 MG tablet Take 1 tablet (81 mg total) by mouth daily. Swallow whole. 30 tablet 12   atorvastatin (LIPITOR) 40 MG tablet Take 1 tablet (40 mg total) by mouth every evening. 30 tablet 5   Cyanocobalamin 1000 MCG TBCR Take 1 tablet by mouth daily.     ELIQUIS 5 MG TABS tablet TAKE 1 TABLET BY MOUTH TWICE DAILY 60 tablet 5   metoprolol tartrate (LOPRESSOR) 25 MG tablet Take 0.5 tablets (12.5 mg total) by mouth 2 (two) times daily. 30 tablet 5   nitroGLYCERIN (NITROSTAT) 0.4 MG SL tablet Place 1 tablet (0.4 mg total) under the tongue every 5 (five) minutes as needed for chest pain. 20 tablet 3   potassium chloride SA (KLOR-CON M) 20 MEQ tablet Take 1  tablet (20 mEq total) by mouth daily. 90 tablet 3   sacubitril-valsartan (ENTRESTO) 24-26 MG Take 1 tablet by mouth 2 (two) times daily. 60 tablet 5   spironolactone (ALDACTONE) 25 MG tablet Take 1 tablet (25 mg total) by mouth daily. 30 tablet 6   Torsemide 40 MG TABS Take 20 mg by mouth daily. And additional 20mg  if needed for weight gain, swelling 60 tablet 5   No current facility-administered medications for this visit.   Vitals:   12/19/22 1359  BP: (!) 115/59  Pulse: 70  SpO2: 97%  Weight: 273 lb (123.8 kg)   Wt Readings from Last 3 Encounters:  12/19/22 273 lb (123.8 kg)  10/28/22 268 lb (121.6 kg)  10/28/22 267 lb 6.4 oz (121.3 kg)   Lab Results  Component Value Date   CREATININE 1.63 (H) 10/28/2022   CREATININE 1.32 (H) 07/16/2022   CREATININE 2.06 (H) 02/12/2022    PHYSICAL EXAM:  General:  Well appearing. No resp difficulty HEENT: normal Neck: supple. JVP flat. No lymphadenopathy or thryomegaly appreciated. Cor: PMI normal. Regular rate & rhythm. No rubs, gallops or murmurs. Lungs: clear Abdomen: soft, nontender, nondistended. No hepatosplenomegaly. No bruits or masses.  Extremities: no cyanosis,  clubbing, rash, trace pitting edema bilateral lower legs Neuro: alert & oriented x3, cranial nerves grossly intact. Moves all 4 extremities w/o difficulty. Affect pleasant.   ECG: not done   ReDs: 37%   ASSESSMENT & PLAN:  1: Ischemic heart failure with preserved ejection fraction- - NYHA class II - euvolemic - weighing daily; reminded to call for an overnight weight gain of > 2 pounds or a weekly weight gain of > 5 pounds - weight up 5 pounds from last visit here 2 months ago - ReDs 37% - Echo 01/07/22: EF 65-70% with mild LAE and moderate MR.  - TEE 01/13/22: EF 55-60%, moderate LAE, NO thrombus, moderate grade III plaque, can not exclude small PFO. Successful direct current cardioversion.   - continue entresto 24/26mg  BID - continue metoprolol tartrate 12.5mg  BID - continue spironolactone 25mg  daily - continue torsemide 20mg  daily 4 days a week & 40mg  daily on M, W, F/ potassium daily - not terribly interested in SGLT2 as he says that he already takes "a lot" of meds; will continue to discuss - BNP 01/20/22 was 937.3  2: Atrial fibrillation- - continue amiodarone 100mg  daily - continue apixaban 5mg  BID - continue metoprolol tartrate 12.5mg  BID - saw cardiology Juliann Pares) 09/24  3: Hypothyroidism- - saw PCP @ [facility] 09/24 - now taking levothyroxine 25 mcg daily - TSH 12/11/22 was 13.44 - BMP 12/08/22 reviewed and showed sodium 136, potassium 3.7, creatinine 1.5 and GFR 54  4: CAD- - previous stent placement in LAD in 2013 - LHC and angiography 01/06/22:   Prox RCA lesion is 20% stenosed.   Mid RCA lesion is 20% stenosed.   Dist RCA lesion is 40% stenosed.   RPAV lesion is 40% stenosed.   Prox Cx lesion is 50% stenosed.   2nd Mrg lesion is 50% stenosed.   1st Mrg lesion is 30% stenosed.   Prox LAD lesion is 50% stenosed.   1st Diag lesion is 60% stenosed.   Previously placed Prox LAD to Mid LAD stent of unknown type is  widely patent.   The left  ventricular systolic function is normal.   The left ventricular ejection fraction is 50-55% by visual estimate.  1.  Diffuse moderate nonobstructive coronary artery disease with patent  stent proximal/mid LAD, 50% stenosis proximal LAD, 50% stenosis proximal left circumflex, 40% distal RCA 2.  Normal left ventricular function with estimated LVEF 50-55%  5: COPD- - saw pulmonology Meredeth Ide) 05/24 - PFT 08/04/22   Return in 3 months, sooner if needed.

## 2022-12-19 ENCOUNTER — Encounter: Payer: Self-pay | Admitting: Family

## 2022-12-19 ENCOUNTER — Ambulatory Visit: Payer: BC Managed Care – PPO | Attending: Family | Admitting: Family

## 2022-12-19 VITALS — BP 115/59 | HR 70 | Wt 273.0 lb

## 2022-12-19 DIAGNOSIS — I5032 Chronic diastolic (congestive) heart failure: Secondary | ICD-10-CM

## 2022-12-19 DIAGNOSIS — Z7901 Long term (current) use of anticoagulants: Secondary | ICD-10-CM | POA: Diagnosis not present

## 2022-12-19 DIAGNOSIS — I480 Paroxysmal atrial fibrillation: Secondary | ICD-10-CM | POA: Diagnosis not present

## 2022-12-19 DIAGNOSIS — E039 Hypothyroidism, unspecified: Secondary | ICD-10-CM | POA: Insufficient documentation

## 2022-12-19 DIAGNOSIS — I2510 Atherosclerotic heart disease of native coronary artery without angina pectoris: Secondary | ICD-10-CM | POA: Insufficient documentation

## 2022-12-19 DIAGNOSIS — E032 Hypothyroidism due to medicaments and other exogenous substances: Secondary | ICD-10-CM | POA: Diagnosis not present

## 2022-12-19 DIAGNOSIS — I472 Ventricular tachycardia, unspecified: Secondary | ICD-10-CM | POA: Insufficient documentation

## 2022-12-19 DIAGNOSIS — Z79899 Other long term (current) drug therapy: Secondary | ICD-10-CM | POA: Insufficient documentation

## 2022-12-19 DIAGNOSIS — Z79890 Hormone replacement therapy (postmenopausal): Secondary | ICD-10-CM | POA: Diagnosis not present

## 2022-12-19 DIAGNOSIS — J449 Chronic obstructive pulmonary disease, unspecified: Secondary | ICD-10-CM | POA: Diagnosis not present

## 2022-12-19 DIAGNOSIS — I4891 Unspecified atrial fibrillation: Secondary | ICD-10-CM | POA: Diagnosis not present

## 2022-12-19 DIAGNOSIS — E785 Hyperlipidemia, unspecified: Secondary | ICD-10-CM | POA: Diagnosis not present

## 2022-12-19 DIAGNOSIS — I5030 Unspecified diastolic (congestive) heart failure: Secondary | ICD-10-CM | POA: Insufficient documentation

## 2022-12-19 MED ORDER — ROSUVASTATIN CALCIUM 40 MG PO TABS
40.0000 mg | ORAL_TABLET | Freq: Every day | ORAL | Status: AC
Start: 2022-12-19 — End: ?

## 2022-12-19 MED ORDER — LEVOTHYROXINE SODIUM 25 MCG PO TABS: 25.0000 ug | ORAL_TABLET | Freq: Every day | ORAL | Status: AC

## 2022-12-19 MED ORDER — EZETIMIBE 10 MG PO TABS: 10.0000 mg | ORAL_TABLET | Freq: Every day | ORAL | Status: AC

## 2022-12-19 NOTE — Patient Instructions (Addendum)
Good to see you today, keep up the good work!  Increase your torsemide to 2 tablets on Monday, Wednesday & Friday. The other days of the week, keep taking 1 tablet.

## 2022-12-19 NOTE — Progress Notes (Unsigned)
Referring Physician:  Annice Needy, MD [address] [place],  [place] [address]  Primary Physician:  [facility]  History of Present Illness: 12/22/2022 [name] has a history of CAD, ischemic cardiomyopathy, CHF, afib, HTN, MI, cardiac stent, COPD, and hyperlipidemia.   Referred by vascular for neurogenic claudication.   He has bilateral hip pain with pain in both anterior thighs that is intermittent x 8 months. Pain is worse with walking- he has to stop and then pain will improve. No pain with getting in/out of a car. Pain is worse in the morning as well. Pain is aching in nature. No pain past the knees. He notes some intermittent LBP that is tolerable. No numbness or tingling. He notes weakness in both legs.   He is taking ELIQUIS.   Bowel/Bladder Dysfunction: none  Conservative measures:  Physical therapy: has not participated in PT  Multimodal medical therapy including regular antiinflammatories: none  Injections: no epidural steroid injections  Past Surgery: no  [employer] has no symptoms of cervical myelopathy.  The symptoms are causing a significant impact on the patient's life.   Review of Systems:  A 10 point review of systems is negative, except for the pertinent positives and negatives detailed in the HPI.  Past Medical History: Past Medical History:  Diagnosis Date   A-fib (HCC)    CHF (congestive heart failure) (HCC)    Coronary artery disease    History of kidney stones     Past Surgical History: Past Surgical History:  Procedure Laterality Date   CARDIOVERSION N/A 01/13/2022   Procedure: CARDIOVERSION;  Surgeon: Lamar Blinks, MD;  Location: [facility] ORS;  Service: Cardiovascular;  Laterality: N/A;   LEFT HEART CATH AND CORONARY ANGIOGRAPHY N/A 01/06/2022   Procedure: LEFT HEART CATH AND CORONARY ANGIOGRAPHY possible PCI and stent;  Surgeon: Marcina Millard, MD;  Location: [facility] INVASIVE CV LAB;  Service:  Cardiovascular;  Laterality: N/A;   TEE WITHOUT CARDIOVERSION N/A 01/13/2022   Procedure: TRANSESOPHAGEAL ECHOCARDIOGRAM (TEE);  Surgeon: Lamar Blinks, MD;  Location: [facility] ORS;  Service: Cardiovascular;  Laterality: N/A;    Allergies: Allergies as of 12/22/2022 - Review Complete 12/22/2022  Allergen Reaction Noted   Statins Other (See Comments) 08/05/2013    Medications: Outpatient Encounter Medications as of 12/22/2022  Medication Sig   amiodarone (PACERONE) 200 MG tablet Take 0.5 tablets (100 mg total) by mouth daily.   aspirin EC 81 MG tablet Take 1 tablet (81 mg total) by mouth daily. Swallow whole.   Cyanocobalamin 1000 MCG TBCR Take 1 tablet by mouth daily.   ELIQUIS 5 MG TABS tablet TAKE 1 TABLET BY MOUTH TWICE DAILY   ezetimibe (ZETIA) 10 MG tablet Take 1 tablet (10 mg total) by mouth daily.   levothyroxine (SYNTHROID) 25 MCG tablet Take 1 tablet (25 mcg total) by mouth daily before breakfast.   metoprolol tartrate (LOPRESSOR) 25 MG tablet Take 0.5 tablets (12.5 mg total) by mouth 2 (two) times daily.   nitroGLYCERIN (NITROSTAT) 0.4 MG SL tablet Place 1 tablet (0.4 mg total) under the tongue every 5 (five) minutes as needed for chest pain.   potassium chloride SA (KLOR-CON M) 20 MEQ tablet Take 1 tablet (20 mEq total) by mouth daily.   rosuvastatin (CRESTOR) 40 MG tablet Take 1 tablet (40 mg total) by mouth daily.   sacubitril-valsartan (ENTRESTO) 24-26 MG Take 1 tablet by mouth 2 (two) times daily.   spironolactone (ALDACTONE) 25 MG tablet  Take 1 tablet (25 mg total) by mouth daily.   Torsemide 40 MG TABS Take 20 mg by mouth daily. And additional 20mg  if needed for weight gain, swelling (Patient taking differently: Take 20 mg by mouth daily. Daily except on M, W, F take 40mg  daily)   [DISCONTINUED] atorvastatin (LIPITOR) 40 MG tablet Take 1 tablet (40 mg total) by mouth every evening. (Patient not taking: Reported on 12/19/2022)   No facility-administered encounter  medications on file as of 12/22/2022.    Social History: Social History   Tobacco Use   Smoking status: Former    Current packs/day: 0.00    Types: Cigarettes    Quit date: 01/04/2022    Years since quitting: 0.9  Vaping Use   Vaping status: Never Used  Substance Use Topics   Alcohol use: Not Currently   Drug use: Never    Family Medical History: Family History  Problem Relation Age of Onset   Stroke Father    Cancer Paternal Aunt    Cancer Paternal Aunt    Stroke Paternal Uncle    Aneurysm Maternal Grandmother    Stroke Paternal Grandmother    Heart disease Paternal Grandfather     Physical Examination: Vitals:   12/22/22 1400  BP: 110/70    General: Patient is well developed, well nourished, calm, collected, and in no apparent distress. Attention to examination is appropriate.  Respiratory: Patient is breathing without any difficulty.   NEUROLOGICAL:     Awake, alert, oriented to person, place, and time.  Speech is clear and fluent. Fund of knowledge is appropriate.   Cranial Nerves: Pupils equal round and reactive to light.  Facial tone is symmetric.    No lower lumbar tenderness.   He has good ROM of both hips with no pain. No pain with IR/ER of both hips.   No abnormal lesions on exposed skin.   Strength: Side Biceps Triceps Deltoid Interossei Grip Wrist Ext. Wrist Flex.  R 5 5 5 5 5 5 5   L 5 5 5 5 5 5 5    Side Iliopsoas Quads Hamstring PF DF EHL  R 5 5 5 5 5 5   L 5 5 5 5 5 5    Reflexes are 2+ and symmetric at the biceps, brachioradialis, patella and achilles.   Hoffman's is absent.  Clonus is not present.   Bilateral upper and lower extremity sensation is intact to light touch.     Gait is normal.     Medical Decision Making  Imaging: No lumbar imaging.   Assessment and Plan: [name] is a pleasant 57 y.o. male who has an 8+ month history of intermittent bilateral hip pain with pain in both anterior thighs that is worse with  walking- he has to stop and then pain will improve. No pain past the knees. He notes some intermittent LBP that is tolerable.  No lumbar imaging. He has no pain with IR/ER of both hips. His hip and anterior thigh pain appears to be lumbar mediated.   Treatment options discussed with patient and following plan made:   - MRI of lumbar spine to further evaluate lumbar radiculopathy. No improvement with time.  - Medications limited as he is on ELIQUIS.  - Depending on MRI results, may consider lumbar PT and/or injections.  - Will schedule follow up visit with me to review MRI results once I get them back.   I spent a total of 30 minutes in face-to-face and non-face-to-face activities related to this  patient's care today including review of outside records, review of imaging, review of symptoms, physical exam, discussion of differential diagnosis, discussion of treatment options, and documentation.   Thank you for involving me in the care of this patient.   Drake Leach PA-C Dept. of Neurosurgery

## 2022-12-22 ENCOUNTER — Ambulatory Visit: Payer: BC Managed Care – PPO | Admitting: Orthopedic Surgery

## 2022-12-22 ENCOUNTER — Encounter: Payer: Self-pay | Admitting: Orthopedic Surgery

## 2022-12-22 VITALS — BP 110/70 | Ht 73.0 in | Wt 271.0 lb

## 2022-12-22 DIAGNOSIS — M5441 Lumbago with sciatica, right side: Secondary | ICD-10-CM

## 2022-12-22 DIAGNOSIS — M5442 Lumbago with sciatica, left side: Secondary | ICD-10-CM

## 2022-12-22 DIAGNOSIS — M5416 Radiculopathy, lumbar region: Secondary | ICD-10-CM | POA: Diagnosis not present

## 2022-12-22 NOTE — Patient Instructions (Signed)
It was so nice to see you today. Thank you so much for coming in.    I think the pain in your hips and legs may be coming from your back.   I want to get an MRI of your lower back to look into things further. We will get this approved through your insurance and [facility] will call you to schedule the appointment.   [facility] (building with the white pillars)is located off of [address]. The address is [address] [place], [place] [address].   After you have the MRI, it takes 10-14 days for me to get the results back. Once I have them, we will call you to schedule a follow up visit with me to review them.   Please do not hesitate to call if you have any questions or concerns. You can also message me in MyChart.   Drake Leach PA-C [phone]

## 2022-12-26 ENCOUNTER — Other Ambulatory Visit: Payer: Self-pay

## 2022-12-26 DIAGNOSIS — M5416 Radiculopathy, lumbar region: Secondary | ICD-10-CM

## 2022-12-26 DIAGNOSIS — M5441 Lumbago with sciatica, right side: Secondary | ICD-10-CM

## 2023-01-05 DIAGNOSIS — Z7901 Long term (current) use of anticoagulants: Secondary | ICD-10-CM | POA: Diagnosis not present

## 2023-01-05 DIAGNOSIS — I255 Ischemic cardiomyopathy: Secondary | ICD-10-CM | POA: Diagnosis not present

## 2023-01-05 DIAGNOSIS — I2510 Atherosclerotic heart disease of native coronary artery without angina pectoris: Secondary | ICD-10-CM | POA: Diagnosis not present

## 2023-01-05 DIAGNOSIS — I252 Old myocardial infarction: Secondary | ICD-10-CM | POA: Diagnosis not present

## 2023-01-22 ENCOUNTER — Other Ambulatory Visit: Admission: RE | Admit: 2023-01-22 | Payer: BC Managed Care – PPO | Source: Home / Self Care

## 2023-01-22 DIAGNOSIS — Z111 Encounter for screening for respiratory tuberculosis: Secondary | ICD-10-CM | POA: Diagnosis not present

## 2023-01-22 DIAGNOSIS — L400 Psoriasis vulgaris: Secondary | ICD-10-CM | POA: Diagnosis not present

## 2023-01-22 DIAGNOSIS — Z79899 Other long term (current) drug therapy: Secondary | ICD-10-CM | POA: Diagnosis not present

## 2023-01-28 DIAGNOSIS — E038 Other specified hypothyroidism: Secondary | ICD-10-CM | POA: Diagnosis not present

## 2023-02-12 ENCOUNTER — Encounter: Payer: Self-pay | Admitting: Family

## 2023-03-16 ENCOUNTER — Other Ambulatory Visit: Payer: Self-pay | Admitting: Family

## 2023-03-20 NOTE — Progress Notes (Unsigned)
PCP: [facility] (last seen 12/24) Primary Cardiologist: Dorothyann Peng, MD (09/24)  Chief Complaint: Fatigue   HPI:  Mr Andres [name] is a 57 y/o male with a history of CAD, MI s/p PCI/DES to prox-mid LAD (2013), aortic atherosclerosis, atrial fibrillation s/p cardioversion (01/13/2022), NSVT, hyperlipidemia, psoriasis, questionable to small PFO (noted on TEE in 12/2021), kidney stones, tobacco use and chronic heart failure.   Admitted 01/05/22 due to SOB/ chest pain. Needed 5L oxygen due to hypoxia. CXR showed pulmonary edema and lasix started. Cardiology consult obtained. Patient underwent left heart catheterization on 01/06/2022 which shows Diffuse moderate nonobstructive coronary artery disease with patent stent proximal/mid LAD, 50% stenosis proximal LAD, 50% stenosis proximal left circumflex, 40% distal RCA.  Widely patent previously placed stent in LAD. Short runs of NSVT & intermittent AF/RVR and metoprolol increased. Weaned off oxygen. Discharged after 3 days. Admitted 01/10/22 due to SOB with AF RVR. Cardiology consult obtained. Started on diltiazem gtt which was stopped after 1 day. Echo done with successful cardioversion. CXR showed pulmonary edema. Given metolazone and IV lasix. Transitioned to oral lasix with potassium. Discharged after 3 days. Was in the ED 01/16/22 due to hypokalemia with a potassium of 2.5 reported by his PCP. EKG Rate: 54  Rhythm: normal EKG, normal sinus rhythm  Axis: nl Intervals:long qtc 600, no ischemic changes, min ST depressions in lateral leads. A re-check of his ECG shows. Was in the ED 01/19/22 due to acute on chronic HF where he was treated and released. resolution of prior QTC prolongation and lateral STD. IV/PO potassium given and he was released. Admitted 01/20/22 due to shortness of breath, weight gain and pedal edema. Initially given IV lasix with transition to or diuretics. Cardiology consult obtained. Discharged after 2 days.   Echo 01/07/22: EF  65-70% with mild LAE and moderate MR.  TEE 01/13/22: EF 55-60%, moderate LAE, NO thrombus, moderate grade III plaque, can not exclude small PFO. Successful direct current cardioversion.    LHC and angiography 01/06/22:   Prox RCA lesion is 20% stenosed.   Mid RCA lesion is 20% stenosed.   Dist RCA lesion is 40% stenosed.   RPAV lesion is 40% stenosed.   Prox Cx lesion is 50% stenosed.   2nd Mrg lesion is 50% stenosed.   1st Mrg lesion is 30% stenosed.   Prox LAD lesion is 50% stenosed.   1st Diag lesion is 60% stenosed.   Previously placed Prox LAD to Mid LAD stent of unknown type is  widely patent.   The left ventricular systolic function is normal.   The left ventricular ejection fraction is 50-55% by visual estimate.  1.  Diffuse moderate nonobstructive coronary artery disease with patent stent proximal/mid LAD, 50% stenosis proximal LAD, 50% stenosis proximal left circumflex, 40% distal RCA 2.  Normal left ventricular function with estimated LVEF 50-55%  He presents today for a HF follow-up visit with a chief complaint of minimal fatigue with moderate exertion. Chronic in nature. Has associated back pain & intermittent indigestion along with this. Denies shortness of breath, chest pain, cough, palpitations, abdominal distention, abdominal distention, pedal edema, dizziness or difficulty sleeping. Reports sleeping ok on 2 pillows under the head. Tries to keep HOB elevated at ~ 45 degrees due to reflux.   Says that he's lost his medical insurance because his job at the [employer] is ending. He is hoping to get medical insurance in Feb/ March of next year but says that his eliquis and entresto are very  expensive and is asking for samples today. He has not taken any of his medications yet today but will do so upon his return home after he eats lunch.   Was supposed to start repatha but could not get it approved by his insurance.   ROS: All systems negative except as listed in HPI, PMH and  Problem List.  SH:  Social History   Socioeconomic History   Marital status: Married    Spouse name: Not on file   Number of children: Not on file   Years of education: Not on file   Highest education level: Not on file  Occupational History   Not on file  Tobacco Use   Smoking status: Former    Current packs/day: 0.00    Types: Cigarettes    Quit date: 01/04/2022    Years since quitting: 1.2   Smokeless tobacco: Not on file  Vaping Use   Vaping status: Never Used  Substance and Sexual Activity   Alcohol use: Not Currently   Drug use: Never   Sexual activity: Not on file  Other Topics Concern   Not on file  Social History Narrative   Not on file   Social Drivers of Health   Financial Resource Strain: Low Risk  (06/03/2022)   Received from [facility], [facility]   Overall Financial Resource Strain (CARDIA)    Difficulty of Paying Living Expenses: Not hard at all  Food Insecurity: Unknown (06/03/2022)   Received from [facility], [facility]   Hunger Vital Sign    Worried About Running Out of Food in the Last Year: Never true    Ran Out of Food in the Last Year: Not on file  Transportation Needs: Unknown (06/03/2022)   Received from [facility], [facility]   [facility] - Transportation    In the past 12 months, has lack of transportation kept you from medical appointments or from getting medications?: No    Lack of Transportation (Non-Medical): Not on file  Physical Activity: Not on file  Stress: Not on file  Social Connections: Not on file  Intimate Partner Violence: Not At Risk (01/21/2022)   Humiliation, Afraid, Rape, and Kick questionnaire    Fear of Current or Ex-Partner: No    Emotionally Abused: No    Physically Abused: No    Sexually Abused: No   FH:  Family History  Problem Relation Age of Onset   Stroke Father    Cancer Paternal Aunt    Cancer  Paternal Aunt    Stroke Paternal Uncle    Aneurysm Maternal Grandmother    Stroke Paternal Grandmother    Heart disease Paternal Grandfather     Past Medical History:  Diagnosis Date   A-fib (HCC)    CHF (congestive heart failure) (HCC)    Coronary artery disease    History of kidney stones     Current Outpatient Medications  Medication Sig Dispense Refill   amiodarone (PACERONE) 200 MG tablet Take 0.5 tablets (100 mg total) by mouth daily. 15 tablet 5   aspirin EC 81 MG tablet Take 1 tablet (81 mg total) by mouth daily. Swallow whole. 30 tablet 12   Cyanocobalamin 1000 MCG TBCR Take 1 tablet by mouth daily.     ELIQUIS 5 MG TABS tablet TAKE 1 TABLET BY MOUTH TWICE DAILY 60 tablet 5   ezetimibe (ZETIA) 10 MG tablet Take 1 tablet (10 mg total)  by mouth daily.     levothyroxine (SYNTHROID) 25 MCG tablet Take 1 tablet (25 mcg total) by mouth daily before breakfast.     metoprolol tartrate (LOPRESSOR) 25 MG tablet Take 0.5 tablets (12.5 mg total) by mouth 2 (two) times daily. 30 tablet 5   nitroGLYCERIN (NITROSTAT) 0.4 MG SL tablet Place 1 tablet (0.4 mg total) under the tongue every 5 (five) minutes as needed for chest pain. 20 tablet 3   potassium chloride SA (KLOR-CON M) 20 MEQ tablet TAKE 1 TABLET BY MOUTH ONCE DAILY 90 tablet 3   rosuvastatin (CRESTOR) 40 MG tablet Take 1 tablet (40 mg total) by mouth daily.     sacubitril-valsartan (ENTRESTO) 24-26 MG Take 1 tablet by mouth 2 (two) times daily. 60 tablet 5   spironolactone (ALDACTONE) 25 MG tablet Take 1 tablet (25 mg total) by mouth daily. 30 tablet 6   Torsemide 40 MG TABS Take 20 mg by mouth daily. And additional 20mg  if needed for weight gain, swelling (Patient taking differently: Take 20 mg by mouth daily. Daily except on M, W, F take 40mg  daily) 60 tablet 5   No current facility-administered medications for this visit.   Vitals:   03/23/23 1315  BP: (!) 142/73  Pulse: 61  SpO2: 98%  Weight: 273 lb 8 oz (124.1 kg)   Wt  Readings from Last 3 Encounters:  03/23/23 273 lb 8 oz (124.1 kg)  12/22/22 271 lb (122.9 kg)  12/19/22 273 lb (123.8 kg)   Lab Results  Component Value Date   CREATININE 1.63 (H) 10/28/2022   CREATININE 1.32 (H) 07/16/2022   CREATININE 2.06 (H) 02/12/2022   PHYSICAL EXAM:  General:  Well appearing. No resp difficulty HEENT: normal Neck: supple. JVP flat. No lymphadenopathy or thryomegaly appreciated. Cor: PMI normal. Regular rate & rhythm. No rubs, gallops or murmurs. Lungs: clear Abdomen: soft, nontender, nondistended. No hepatosplenomegaly. No bruits or masses.  Extremities: no cyanosis, clubbing, rash, trace pitting edema bilateral lower legs Neuro: alert & oriented x3, cranial nerves grossly intact. Moves all 4 extremities w/o difficulty. Affect pleasant.   ECG: not done    ASSESSMENT & PLAN:  1: Ischemic heart failure with preserved ejection fraction- - NYHA class II - euvolemic - weighing daily; reminded to call for an overnight weight gain of > 2 pounds or a weekly weight gain of > 5 pounds - weight stable from last visit here 3 months ago - Echo 01/07/22: EF 65-70% with mild LAE and moderate MR.  - TEE 01/13/22: EF 55-60%, moderate LAE, NO thrombus, moderate grade III plaque, can not exclude small PFO. Successful direct current cardioversion.   - continue entresto 24/26mg  BID; samples provided today - continue metoprolol tartrate 12.5mg  BID - continue spironolactone 25mg  daily - continue torsemide 20mg  daily 4 days a week & 40mg  daily on M, W, F/ potassium daily - not terribly interested in SGLT2 as he says that he already takes "a lot" of meds; will continue to discuss - BNP 01/20/22 was 937.3  2: Atrial fibrillation- - continue amiodarone 100mg  daily - continue apixaban 5mg  BID; samples provided today - continue metoprolol tartrate 12.5mg  BID - saw cardiology Juliann Pares) 09/24  3: Hypothyroidism- - saw PCP @ [facility] 12/24 - taking  levothyroxine 25 mcg daily - TSH 01/28/23 was 9.28 - BMP 12/08/22 reviewed and showed sodium 136, potassium 3.7, creatinine 1.5 and GFR 54  4: CAD- - previous stent placement in LAD in 2013 - LHC and angiography  01/06/22:   Prox RCA lesion is 20% stenosed.   Mid RCA lesion is 20% stenosed.   Dist RCA lesion is 40% stenosed.   RPAV lesion is 40% stenosed.   Prox Cx lesion is 50% stenosed.   2nd Mrg lesion is 50% stenosed.   1st Mrg lesion is 30% stenosed.   Prox LAD lesion is 50% stenosed.   1st Diag lesion is 60% stenosed.   Previously placed Prox LAD to Mid LAD stent of unknown type is  widely patent.   The left ventricular systolic function is normal.   The left ventricular ejection fraction is 50-55% by visual estimate.  1.  Diffuse moderate nonobstructive coronary artery disease with patent stent proximal/mid LAD, 50% stenosis proximal LAD, 50% stenosis proximal left circumflex, 40% distal RCA 2.  Normal left ventricular function with estimated LVEF 50-55%  5: COPD- - saw pulmonology Meredeth Ide) 05/24 - PFT 08/04/22   6: Hyperlipidemia- - saw cardiology Ozella Almond) 10/24 and was to start repatha although they couldn't get it approved by his insurance - LDL 12/08/22 was 190 - continue zetia 10mg  daily - continue rosuvastatin 40mg  daily  Advised [facility] to [facility] to [facility] to see about filling out patient assistance paperwork during this time where he doesn't have any insurance. Pharmacy was informed that patient would be coming by tomorrow. Per their request, RX for eliquis and entresto [facility].    Due to him having close follow-up with cardiology, will not make a return appointment at this time. Advised patient to call for any questions/ problems and we could make another appointment at that time and he was comfortable with this plan.

## 2023-03-23 ENCOUNTER — Ambulatory Visit: Payer: Self-pay | Attending: Family | Admitting: Family

## 2023-03-23 ENCOUNTER — Other Ambulatory Visit: Payer: Self-pay

## 2023-03-23 ENCOUNTER — Encounter: Payer: Self-pay | Admitting: Family

## 2023-03-23 VITALS — BP 142/73 | HR 61 | Wt 273.5 lb

## 2023-03-23 DIAGNOSIS — J449 Chronic obstructive pulmonary disease, unspecified: Secondary | ICD-10-CM | POA: Insufficient documentation

## 2023-03-23 DIAGNOSIS — E039 Hypothyroidism, unspecified: Secondary | ICD-10-CM | POA: Insufficient documentation

## 2023-03-23 DIAGNOSIS — I252 Old myocardial infarction: Secondary | ICD-10-CM | POA: Insufficient documentation

## 2023-03-23 DIAGNOSIS — L409 Psoriasis, unspecified: Secondary | ICD-10-CM | POA: Insufficient documentation

## 2023-03-23 DIAGNOSIS — I4729 Other ventricular tachycardia: Secondary | ICD-10-CM | POA: Insufficient documentation

## 2023-03-23 DIAGNOSIS — Z7901 Long term (current) use of anticoagulants: Secondary | ICD-10-CM | POA: Insufficient documentation

## 2023-03-23 DIAGNOSIS — I255 Ischemic cardiomyopathy: Secondary | ICD-10-CM | POA: Insufficient documentation

## 2023-03-23 DIAGNOSIS — I2510 Atherosclerotic heart disease of native coronary artery without angina pectoris: Secondary | ICD-10-CM | POA: Insufficient documentation

## 2023-03-23 DIAGNOSIS — Z5971 Insufficient health insurance coverage: Secondary | ICD-10-CM | POA: Insufficient documentation

## 2023-03-23 DIAGNOSIS — Z79899 Other long term (current) drug therapy: Secondary | ICD-10-CM | POA: Insufficient documentation

## 2023-03-23 DIAGNOSIS — I700 Atherosclerosis of aorta: Secondary | ICD-10-CM | POA: Insufficient documentation

## 2023-03-23 DIAGNOSIS — I4891 Unspecified atrial fibrillation: Secondary | ICD-10-CM | POA: Insufficient documentation

## 2023-03-23 DIAGNOSIS — E782 Mixed hyperlipidemia: Secondary | ICD-10-CM

## 2023-03-23 DIAGNOSIS — E032 Hypothyroidism due to medicaments and other exogenous substances: Secondary | ICD-10-CM

## 2023-03-23 DIAGNOSIS — Z955 Presence of coronary angioplasty implant and graft: Secondary | ICD-10-CM | POA: Insufficient documentation

## 2023-03-23 DIAGNOSIS — I480 Paroxysmal atrial fibrillation: Secondary | ICD-10-CM

## 2023-03-23 DIAGNOSIS — I5032 Chronic diastolic (congestive) heart failure: Secondary | ICD-10-CM | POA: Insufficient documentation

## 2023-03-23 DIAGNOSIS — E785 Hyperlipidemia, unspecified: Secondary | ICD-10-CM | POA: Insufficient documentation

## 2023-03-23 DIAGNOSIS — Z87442 Personal history of urinary calculi: Secondary | ICD-10-CM | POA: Insufficient documentation

## 2023-03-23 MED ORDER — APIXABAN 5 MG PO TABS
5.0000 mg | ORAL_TABLET | Freq: Two times a day (BID) | ORAL | 3 refills | Status: AC
  Filled 2023-03-23 (×2): qty 180, 90d supply, fill #0
  Filled 2023-06-15: qty 60, 30d supply, fill #0

## 2023-03-23 MED ORDER — ENTRESTO 24-26 MG PO TABS
1.0000 | ORAL_TABLET | Freq: Two times a day (BID) | ORAL | 3 refills | Status: DC
  Filled 2023-03-23: qty 180, 90d supply, fill #0

## 2023-03-23 NOTE — Progress Notes (Signed)
Medication Samples have been provided to the patient.  Drug name: Eliquis       Strength: 5 mg        Qty: 2  LOT: [id number]   Exp.Date: 05/2024  Drug name: Eliquis Strength; 5 mg Qty: 2 LOT: [id number] Exp date: 04/2024  Dosing instructions: Take one tablet by mouth two times daily   Medication Samples have been provided to the patient.  Drug name: Sherryll Burger       Strength: 24-26 mg        Qty: 2  LOT: [id number]  Exp.Date: 03/23/25  Dosing instructions: Take one tablet by mouth two times daily   The patient has been instructed regarding the correct time, dose, and frequency of taking this medication, including desired effects and most common side effects.   Simonne Maffucci 1:29 PM 03/23/2023

## 2023-03-23 NOTE — Patient Instructions (Addendum)
Go over to the [facility] to the pharmacy. When you walk into the building, go straight back and the pharmacy will be on the right hand side.    Call us in the future if you need [place] for anything.

## 2023-03-24 ENCOUNTER — Other Ambulatory Visit: Payer: Self-pay

## 2023-03-24 MED ORDER — VITAMIN D (ERGOCALCIFEROL) 1.25 MG (50000 UNIT) PO CAPS
50000.0000 [IU] | ORAL_CAPSULE | ORAL | 0 refills | Status: AC
  Filled 2023-03-24: qty 12, 84d supply, fill #0

## 2023-03-24 MED ORDER — METOPROLOL TARTRATE 25 MG PO TABS
12.5000 mg | ORAL_TABLET | Freq: Two times a day (BID) | ORAL | 3 refills | Status: DC
  Filled 2023-03-24: qty 90, 90d supply, fill #0
  Filled 2023-09-02: qty 90, 90d supply, fill #1

## 2023-03-24 MED ORDER — REPATHA SURECLICK 140 MG/ML ~~LOC~~ SOAJ: 140.0000 mg | SUBCUTANEOUS | 11 refills | Status: AC

## 2023-03-24 MED ORDER — ROSUVASTATIN CALCIUM 40 MG PO TABS
40.0000 mg | ORAL_TABLET | Freq: Every day | ORAL | 2 refills | Status: AC
Start: 2022-12-10 — End: ?

## 2023-03-24 MED ORDER — SPIRONOLACTONE 25 MG PO TABS
25.0000 mg | ORAL_TABLET | Freq: Every day | ORAL | 5 refills | Status: AC
  Filled 2023-03-24: qty 30, 30d supply, fill #0
  Filled 2023-04-24: qty 30, 30d supply, fill #1

## 2023-03-24 MED ORDER — EZETIMIBE 10 MG PO TABS
10.0000 mg | ORAL_TABLET | Freq: Every day | ORAL | 2 refills | Status: AC
Start: 2022-12-10 — End: ?
  Filled 2023-09-02: qty 90, 90d supply, fill #0

## 2023-03-24 MED ORDER — OMEPRAZOLE 20 MG PO CPDR
20.0000 mg | DELAYED_RELEASE_CAPSULE | Freq: Every day | ORAL | 9 refills | Status: AC
Start: 2022-09-04 — End: ?
  Filled 2023-03-24: qty 30, 30d supply, fill #0
  Filled 2023-04-24 (×2): qty 30, 30d supply, fill #1

## 2023-03-24 MED ORDER — LEVOTHYROXINE SODIUM 25 MCG PO TABS
25.0000 ug | ORAL_TABLET | Freq: Every day | ORAL | 0 refills | Status: AC
  Filled 2023-03-24: qty 30, 30d supply, fill #0

## 2023-03-24 MED ORDER — APIXABAN 5 MG PO TABS
5.0000 mg | ORAL_TABLET | Freq: Two times a day (BID) | ORAL | 11 refills | Status: AC
Start: 2022-07-16 — End: ?

## 2023-03-24 MED ORDER — ENTRESTO 24-26 MG PO TABS: 1.0000 | ORAL_TABLET | Freq: Two times a day (BID) | ORAL | 1 refills | Status: AC

## 2023-03-24 MED ORDER — AMIODARONE HCL 200 MG PO TABS
100.0000 mg | ORAL_TABLET | Freq: Every day | ORAL | 2 refills | Status: DC
  Filled 2023-03-24: qty 90, 180d supply, fill #0
  Filled 2023-04-24: qty 90, 180d supply, fill #1

## 2023-03-24 MED ORDER — ATORVASTATIN CALCIUM 40 MG PO TABS
40.0000 mg | ORAL_TABLET | Freq: Every day | ORAL | 5 refills | Status: DC
  Filled 2023-03-24: qty 30, 30d supply, fill #0

## 2023-03-24 MED FILL — Potassium Chloride Microencapsulated Crys ER Tab 20 mEq: ORAL | 90 days supply | Qty: 90 | Fill #0 | Status: AC

## 2023-04-10 ENCOUNTER — Other Ambulatory Visit: Payer: Self-pay

## 2023-04-21 ENCOUNTER — Other Ambulatory Visit: Payer: Self-pay

## 2023-04-23 ENCOUNTER — Other Ambulatory Visit: Payer: Self-pay

## 2023-04-24 ENCOUNTER — Other Ambulatory Visit: Payer: Self-pay | Admitting: Family

## 2023-04-24 ENCOUNTER — Other Ambulatory Visit: Payer: Self-pay

## 2023-04-27 ENCOUNTER — Other Ambulatory Visit: Payer: Self-pay

## 2023-04-27 MED FILL — Atorvastatin Calcium Tab 40 MG (Base Equivalent): ORAL | 90 days supply | Qty: 90 | Fill #0 | Status: CN

## 2023-04-28 ENCOUNTER — Other Ambulatory Visit: Payer: Self-pay

## 2023-04-30 ENCOUNTER — Other Ambulatory Visit: Payer: Self-pay

## 2023-05-01 ENCOUNTER — Encounter (INDEPENDENT_AMBULATORY_CARE_PROVIDER_SITE_OTHER): Payer: BC Managed Care – PPO

## 2023-05-01 ENCOUNTER — Ambulatory Visit (INDEPENDENT_AMBULATORY_CARE_PROVIDER_SITE_OTHER): Payer: BC Managed Care – PPO | Admitting: Nurse Practitioner

## 2023-05-04 ENCOUNTER — Other Ambulatory Visit: Payer: Self-pay

## 2023-05-08 ENCOUNTER — Other Ambulatory Visit: Payer: Self-pay

## 2023-05-14 ENCOUNTER — Other Ambulatory Visit: Payer: Self-pay

## 2023-05-19 ENCOUNTER — Other Ambulatory Visit: Payer: Self-pay

## 2023-05-27 ENCOUNTER — Other Ambulatory Visit: Payer: Self-pay

## 2023-06-01 ENCOUNTER — Telehealth: Payer: Self-pay | Admitting: *Deleted

## 2023-06-01 NOTE — Telephone Encounter (Signed)
 Pt's Eliquis arrived in mail from [facility], pt is aware and will p/u later this week.

## 2023-06-08 DIAGNOSIS — Z0000 Encounter for general adult medical examination without abnormal findings: Secondary | ICD-10-CM | POA: Diagnosis not present

## 2023-06-08 DIAGNOSIS — Z131 Encounter for screening for diabetes mellitus: Secondary | ICD-10-CM | POA: Diagnosis not present

## 2023-06-08 DIAGNOSIS — E7801 Familial hypercholesterolemia: Secondary | ICD-10-CM | POA: Diagnosis not present

## 2023-06-08 DIAGNOSIS — Z23 Encounter for immunization: Secondary | ICD-10-CM | POA: Diagnosis not present

## 2023-06-08 DIAGNOSIS — E039 Hypothyroidism, unspecified: Secondary | ICD-10-CM | POA: Diagnosis not present

## 2023-06-08 DIAGNOSIS — Z1331 Encounter for screening for depression: Secondary | ICD-10-CM | POA: Diagnosis not present

## 2023-06-08 DIAGNOSIS — E538 Deficiency of other specified B group vitamins: Secondary | ICD-10-CM | POA: Diagnosis not present

## 2023-06-08 DIAGNOSIS — Z1159 Encounter for screening for other viral diseases: Secondary | ICD-10-CM | POA: Diagnosis not present

## 2023-06-10 ENCOUNTER — Other Ambulatory Visit: Payer: Self-pay

## 2023-06-10 MED ORDER — LEVOTHYROXINE SODIUM 50 MCG PO TABS
50.0000 ug | ORAL_TABLET | Freq: Every day | ORAL | 11 refills | Status: AC
  Filled 2023-06-10: qty 30, 30d supply, fill #0
  Filled 2023-09-17: qty 30, 30d supply, fill #1
  Filled 2023-10-15: qty 30, 30d supply, fill #2

## 2023-06-12 ENCOUNTER — Other Ambulatory Visit (HOSPITAL_COMMUNITY): Payer: Self-pay

## 2023-06-15 ENCOUNTER — Other Ambulatory Visit: Payer: Self-pay

## 2023-06-15 ENCOUNTER — Other Ambulatory Visit (HOSPITAL_COMMUNITY): Payer: Self-pay

## 2023-06-15 DIAGNOSIS — I5032 Chronic diastolic (congestive) heart failure: Secondary | ICD-10-CM | POA: Diagnosis not present

## 2023-06-15 DIAGNOSIS — Z79899 Other long term (current) drug therapy: Secondary | ICD-10-CM | POA: Diagnosis not present

## 2023-06-15 DIAGNOSIS — E66812 Obesity, class 2: Secondary | ICD-10-CM | POA: Diagnosis not present

## 2023-06-15 DIAGNOSIS — I255 Ischemic cardiomyopathy: Secondary | ICD-10-CM | POA: Diagnosis not present

## 2023-06-15 MED ORDER — EZETIMIBE 10 MG PO TABS
10.0000 mg | ORAL_TABLET | Freq: Every day | ORAL | 0 refills | Status: DC
  Filled 2023-06-15: qty 90, 90d supply, fill #0

## 2023-06-15 MED ORDER — PRAVASTATIN SODIUM 20 MG PO TABS
20.0000 mg | ORAL_TABLET | Freq: Every day | ORAL | 0 refills | Status: DC
  Filled 2023-06-15: qty 90, 90d supply, fill #0

## 2023-08-11 ENCOUNTER — Other Ambulatory Visit: Payer: Self-pay

## 2023-08-11 ENCOUNTER — Encounter (INDEPENDENT_AMBULATORY_CARE_PROVIDER_SITE_OTHER): Payer: Self-pay

## 2023-08-28 ENCOUNTER — Telehealth: Payer: Self-pay

## 2023-08-28 NOTE — Telephone Encounter (Signed)
 Called pt to let him know that his eliquis  5mg  has arrived from pt assistance. Lvm to return call.

## 2023-09-02 ENCOUNTER — Other Ambulatory Visit: Payer: Self-pay

## 2023-09-02 MED FILL — Atorvastatin Calcium Tab 40 MG (Base Equivalent): ORAL | 90 days supply | Qty: 90 | Fill #0 | Status: AC

## 2023-09-02 MED FILL — Atorvastatin Calcium Tab 40 MG (Base Equivalent): ORAL | 90 days supply | Qty: 90 | Fill #0 | Status: CN

## 2023-09-03 ENCOUNTER — Other Ambulatory Visit: Payer: Self-pay

## 2023-09-03 MED ORDER — PRAVASTATIN SODIUM 20 MG PO TABS
20.0000 mg | ORAL_TABLET | Freq: Every day | ORAL | 0 refills | Status: DC
  Filled 2023-09-03: qty 90, 90d supply, fill #0

## 2023-09-03 MED ORDER — EZETIMIBE 10 MG PO TABS
10.0000 mg | ORAL_TABLET | Freq: Every day | ORAL | 0 refills | Status: DC
  Filled 2023-09-03: qty 90, 90d supply, fill #0

## 2023-09-17 ENCOUNTER — Other Ambulatory Visit: Payer: Self-pay

## 2023-09-17 ENCOUNTER — Encounter: Payer: Self-pay | Admitting: Family

## 2023-09-18 ENCOUNTER — Other Ambulatory Visit: Payer: Self-pay

## 2023-09-18 MED ORDER — SPIRONOLACTONE 25 MG PO TABS
25.0000 mg | ORAL_TABLET | Freq: Every day | ORAL | 11 refills | Status: AC
  Filled 2023-09-18: qty 30, 30d supply, fill #0

## 2023-09-18 MED ORDER — ENTRESTO 24-26 MG PO TABS: 1.0000 | ORAL_TABLET | Freq: Two times a day (BID) | ORAL | 11 refills | Status: DC

## 2023-09-18 MED ORDER — ENTRESTO 24-26 MG PO TABS
1.0000 | ORAL_TABLET | Freq: Two times a day (BID) | ORAL | 11 refills | Status: AC
  Filled 2023-09-18: qty 60, 30d supply, fill #0
  Filled 2023-10-26: qty 60, 30d supply, fill #1
  Filled 2023-12-02: qty 60, 30d supply, fill #2
  Filled 2024-01-07: qty 60, 30d supply, fill #3
  Filled 2024-02-03: qty 60, 30d supply, fill #4
  Filled 2024-03-02: qty 60, 30d supply, fill #5
  Filled 2024-04-19: qty 60, 30d supply, fill #6

## 2023-09-18 MED ORDER — SPIRONOLACTONE 25 MG PO TABS: 25.0000 mg | ORAL_TABLET | Freq: Every day | ORAL | 11 refills | Status: DC

## 2023-09-18 NOTE — Addendum Note (Signed)
 Addended by: Agatha Duplechain A on: 09/18/2023 04:56 PM   Modules accepted: Orders

## 2023-10-15 ENCOUNTER — Other Ambulatory Visit: Payer: Self-pay

## 2023-10-20 DIAGNOSIS — L400 Psoriasis vulgaris: Secondary | ICD-10-CM | POA: Diagnosis not present

## 2023-10-20 DIAGNOSIS — Z79899 Other long term (current) drug therapy: Secondary | ICD-10-CM | POA: Diagnosis not present

## 2023-10-26 ENCOUNTER — Other Ambulatory Visit: Payer: Self-pay

## 2023-11-11 ENCOUNTER — Telehealth: Payer: Self-pay

## 2023-11-11 NOTE — Telephone Encounter (Signed)
 Called pt to make aware eliquis  arrived to clinic. Pt agreeable to pick up medication. Medication at front desk.

## 2023-11-25 ENCOUNTER — Other Ambulatory Visit: Payer: Self-pay

## 2023-12-02 ENCOUNTER — Other Ambulatory Visit: Payer: Self-pay

## 2023-12-03 ENCOUNTER — Other Ambulatory Visit: Payer: Self-pay

## 2023-12-03 MED ORDER — METOPROLOL TARTRATE 25 MG PO TABS
12.5000 mg | ORAL_TABLET | Freq: Two times a day (BID) | ORAL | 3 refills | Status: AC
  Filled 2023-12-03: qty 90, 90d supply, fill #0
  Filled 2024-03-02: qty 90, 90d supply, fill #1

## 2023-12-15 ENCOUNTER — Other Ambulatory Visit: Payer: Self-pay

## 2023-12-15 MED ORDER — PRAVASTATIN SODIUM 20 MG PO TABS
20.0000 mg | ORAL_TABLET | Freq: Every day | ORAL | 0 refills | Status: DC
  Filled 2023-12-15: qty 90, 90d supply, fill #0

## 2023-12-15 MED ORDER — EZETIMIBE 10 MG PO TABS
10.0000 mg | ORAL_TABLET | Freq: Every day | ORAL | 0 refills | Status: DC
  Filled 2023-12-15: qty 90, 90d supply, fill #0

## 2024-01-07 ENCOUNTER — Other Ambulatory Visit: Payer: Self-pay

## 2024-01-27 DIAGNOSIS — I255 Ischemic cardiomyopathy: Secondary | ICD-10-CM | POA: Diagnosis not present

## 2024-01-27 DIAGNOSIS — I480 Paroxysmal atrial fibrillation: Secondary | ICD-10-CM | POA: Diagnosis not present

## 2024-01-27 DIAGNOSIS — I5032 Chronic diastolic (congestive) heart failure: Secondary | ICD-10-CM | POA: Diagnosis not present

## 2024-01-29 ENCOUNTER — Telehealth: Payer: Self-pay

## 2024-01-29 NOTE — Telephone Encounter (Signed)
 Lvm for pt to pick up eliquis  at office.

## 2024-02-03 ENCOUNTER — Other Ambulatory Visit: Payer: Self-pay

## 2024-02-03 MED ORDER — EZETIMIBE 10 MG PO TABS
10.0000 mg | ORAL_TABLET | Freq: Every day | ORAL | 0 refills | Status: AC
  Filled 2024-02-03 – 2024-03-23 (×2): qty 90, 90d supply, fill #0

## 2024-02-12 ENCOUNTER — Other Ambulatory Visit: Payer: Self-pay

## 2024-03-02 ENCOUNTER — Other Ambulatory Visit: Payer: Self-pay

## 2024-03-23 ENCOUNTER — Other Ambulatory Visit: Payer: Self-pay

## 2024-03-23 MED ORDER — PRAVASTATIN SODIUM 20 MG PO TABS
20.0000 mg | ORAL_TABLET | Freq: Every day | ORAL | 3 refills | Status: AC
  Filled 2024-03-23: qty 90, 90d supply, fill #0

## 2024-04-07 ENCOUNTER — Other Ambulatory Visit: Payer: Self-pay

## 2024-04-19 ENCOUNTER — Other Ambulatory Visit: Payer: Self-pay

## 2024-04-19 MED ORDER — AMIODARONE HCL 200 MG PO TABS
100.0000 mg | ORAL_TABLET | Freq: Every day | ORAL | 2 refills | Status: AC
  Filled 2024-04-19: qty 45, 90d supply, fill #0

## 2024-04-22 ENCOUNTER — Other Ambulatory Visit
Admission: RE | Admit: 2024-04-22 | Discharge: 2024-04-22 | Disposition: A | Attending: Dermatology | Admitting: Dermatology

## 2024-04-22 LAB — HEPATITIS PANEL, ACUTE
HCV Ab: NONREACTIVE
Hep A IgM: NONREACTIVE
Hep B C IgM: NONREACTIVE
Hepatitis B Surface Ag: NONREACTIVE

## 2024-04-22 LAB — VITAMIN D 25 HYDROXY (VIT D DEFICIENCY, FRACTURES): Vit D, 25-Hydroxy: 15.1 ng/mL — ABNORMAL LOW (ref 30–100)

## 2024-04-26 LAB — COMPREHENSIVE METABOLIC PANEL WITH GFR
ALT: 25 U/L (ref 0–44)
AST: 22 U/L (ref 15–41)
Albumin: 4.4 g/dL (ref 3.5–5.0)
Alkaline Phosphatase: 54 U/L (ref 38–126)
Anion gap: 15 (ref 5–15)
BUN: 18 mg/dL (ref 6–20)
CO2: 21 mmol/L — ABNORMAL LOW (ref 22–32)
Calcium: 9.3 mg/dL (ref 8.9–10.3)
Chloride: 104 mmol/L (ref 98–111)
Creatinine, Ser: 1.39 mg/dL — ABNORMAL HIGH (ref 0.61–1.24)
GFR, Estimated: 58 mL/min — ABNORMAL LOW
Glucose, Bld: 90 mg/dL (ref 70–99)
Potassium: 4.1 mmol/L (ref 3.5–5.1)
Sodium: 140 mmol/L (ref 135–145)
Total Bilirubin: 0.4 mg/dL (ref 0.0–1.2)
Total Protein: 7.3 g/dL (ref 6.5–8.1)

## 2024-04-26 LAB — QUANTIFERON-TB GOLD PLUS: QuantiFERON-TB Gold Plus: NEGATIVE

## 2024-04-26 LAB — QUANTIFERON-TB GOLD PLUS (RQFGPL)
QuantiFERON Mitogen Value: >10 [IU]/mL
QuantiFERON Nil Value: 0.14 [IU]/mL
QuantiFERON TB1 Ag Value: 0.11 [IU]/mL
QuantiFERON TB2 Ag Value: 0.15 [IU]/mL

## 2024-04-27 ENCOUNTER — Ambulatory Visit: Payer: Self-pay | Admitting: Family

## 2024-05-13 ENCOUNTER — Ambulatory Visit: Payer: Self-pay | Admitting: Family
